# Patient Record
Sex: Female | Born: 1935 | Race: White | Hispanic: No | Smoking: Never smoker
Health system: Southern US, Community
[De-identification: ages and names within clinical notes are randomized; demographics above are authoritative.]

## PROBLEM LIST (undated history)

## (undated) DIAGNOSIS — G20 Parkinson's disease: Secondary | ICD-10-CM

## (undated) DIAGNOSIS — G20A1 Parkinson's disease without dyskinesia, without mention of fluctuations: Secondary | ICD-10-CM

## (undated) DIAGNOSIS — F0390 Unspecified dementia without behavioral disturbance: Secondary | ICD-10-CM

## (undated) DIAGNOSIS — E785 Hyperlipidemia, unspecified: Secondary | ICD-10-CM

## (undated) DIAGNOSIS — I10 Essential (primary) hypertension: Secondary | ICD-10-CM

## (undated) HISTORY — PX: OVARIAN CYST SURGERY: SHX726

## (undated) HISTORY — PX: APPENDECTOMY: SHX54

## (undated) HISTORY — PX: TONSILLECTOMY AND ADENOIDECTOMY: SUR1326

## (undated) HISTORY — PX: JOINT REPLACEMENT: SHX530

## (undated) HISTORY — DX: Hyperlipidemia, unspecified: E78.5

## (undated) HISTORY — DX: Essential (primary) hypertension: I10

---

## 1998-05-02 ENCOUNTER — Other Ambulatory Visit: Admission: RE | Admit: 1998-05-02 | Discharge: 1998-05-02 | Payer: Self-pay | Admitting: Family Medicine

## 1999-05-13 ENCOUNTER — Encounter: Payer: Self-pay | Admitting: Family Medicine

## 1999-05-13 ENCOUNTER — Encounter: Admission: RE | Admit: 1999-05-13 | Discharge: 1999-05-13 | Payer: Self-pay | Admitting: Family Medicine

## 1999-07-31 ENCOUNTER — Emergency Department (HOSPITAL_COMMUNITY): Admission: EM | Admit: 1999-07-31 | Discharge: 1999-07-31 | Payer: Self-pay | Admitting: Internal Medicine

## 2000-09-04 ENCOUNTER — Other Ambulatory Visit: Admission: RE | Admit: 2000-09-04 | Discharge: 2000-09-04 | Payer: Self-pay | Admitting: Family Medicine

## 2001-02-13 ENCOUNTER — Emergency Department (HOSPITAL_COMMUNITY): Admission: EM | Admit: 2001-02-13 | Discharge: 2001-02-13 | Payer: Self-pay | Admitting: Emergency Medicine

## 2001-04-20 ENCOUNTER — Emergency Department (HOSPITAL_COMMUNITY): Admission: EM | Admit: 2001-04-20 | Discharge: 2001-04-20 | Payer: Self-pay | Admitting: Emergency Medicine

## 2001-04-20 ENCOUNTER — Encounter: Payer: Self-pay | Admitting: Emergency Medicine

## 2001-04-28 ENCOUNTER — Encounter: Payer: Self-pay | Admitting: Orthopedic Surgery

## 2001-05-04 ENCOUNTER — Encounter: Payer: Self-pay | Admitting: Orthopedic Surgery

## 2001-05-04 ENCOUNTER — Inpatient Hospital Stay (HOSPITAL_COMMUNITY): Admission: RE | Admit: 2001-05-04 | Discharge: 2001-05-11 | Payer: Self-pay | Admitting: Orthopedic Surgery

## 2003-12-25 ENCOUNTER — Encounter: Admission: RE | Admit: 2003-12-25 | Discharge: 2003-12-25 | Payer: Self-pay | Admitting: Family Medicine

## 2003-12-25 ENCOUNTER — Other Ambulatory Visit: Admission: RE | Admit: 2003-12-25 | Discharge: 2003-12-25 | Payer: Self-pay | Admitting: Family Medicine

## 2006-04-01 ENCOUNTER — Inpatient Hospital Stay (HOSPITAL_COMMUNITY): Admission: RE | Admit: 2006-04-01 | Discharge: 2006-04-06 | Payer: Self-pay | Admitting: Orthopedic Surgery

## 2006-04-02 ENCOUNTER — Ambulatory Visit: Payer: Self-pay | Admitting: Physical Medicine & Rehabilitation

## 2006-07-29 ENCOUNTER — Emergency Department (HOSPITAL_COMMUNITY): Admission: EM | Admit: 2006-07-29 | Discharge: 2006-07-29 | Payer: Self-pay | Admitting: Emergency Medicine

## 2007-01-02 ENCOUNTER — Emergency Department (HOSPITAL_COMMUNITY): Admission: EM | Admit: 2007-01-02 | Discharge: 2007-01-02 | Payer: Self-pay | Admitting: *Deleted

## 2008-11-25 ENCOUNTER — Inpatient Hospital Stay (HOSPITAL_COMMUNITY): Admission: EM | Admit: 2008-11-25 | Discharge: 2008-12-05 | Payer: Self-pay | Admitting: Emergency Medicine

## 2008-11-25 ENCOUNTER — Encounter (INDEPENDENT_AMBULATORY_CARE_PROVIDER_SITE_OTHER): Payer: Self-pay | Admitting: Internal Medicine

## 2008-11-30 ENCOUNTER — Other Ambulatory Visit: Payer: Self-pay | Admitting: Emergency Medicine

## 2008-12-01 ENCOUNTER — Other Ambulatory Visit: Payer: Self-pay | Admitting: Emergency Medicine

## 2008-12-01 ENCOUNTER — Encounter (INDEPENDENT_AMBULATORY_CARE_PROVIDER_SITE_OTHER): Payer: Self-pay | Admitting: Internal Medicine

## 2008-12-03 ENCOUNTER — Other Ambulatory Visit: Payer: Self-pay | Admitting: Internal Medicine

## 2009-03-16 ENCOUNTER — Observation Stay (HOSPITAL_COMMUNITY): Admission: EM | Admit: 2009-03-16 | Discharge: 2009-03-20 | Payer: Self-pay | Admitting: Emergency Medicine

## 2009-08-20 ENCOUNTER — Encounter: Admission: RE | Admit: 2009-08-20 | Discharge: 2009-08-20 | Payer: Self-pay | Admitting: Family Medicine

## 2010-02-10 ENCOUNTER — Encounter: Payer: Self-pay | Admitting: Orthopedic Surgery

## 2010-04-10 LAB — COMPREHENSIVE METABOLIC PANEL
CO2: 25 mEq/L (ref 19–32)
Chloride: 105 mEq/L (ref 96–112)
GFR calc non Af Amer: >60 mL/min (ref >60)
Glucose, Bld: 100 mg/dL — ABNORMAL HIGH (ref 70–99)
Sodium: 138 mEq/L (ref 135–145)
Total Protein: 7.1 g/dL (ref 6.0–8.3)

## 2010-04-10 LAB — CARDIAC PANEL(CRET KIN+CKTOT+MB+TROPI)
CK, MB: 2.3 ng/mL (ref 0.3–4.0)
Relative Index: 1.8 (ref 0.0–2.5)
Total CK: 115 U/L (ref 7–177)
Total CK: 128 U/L (ref 7–177)
Troponin I: 0.02 ng/mL (ref 0.00–0.06)

## 2010-04-10 LAB — DIFFERENTIAL
Basophils Absolute: 0 10*3/uL (ref 0.0–0.1)
Eosinophils Absolute: 0.1 10*3/uL (ref 0.0–0.7)
Monocytes Absolute: 0.6 10*3/uL (ref 0.1–1.0)
Monocytes Relative: 12 % (ref 3–12)
Neutrophils Relative %: 62 % (ref 43–77)

## 2010-04-10 LAB — URINALYSIS, ROUTINE W REFLEX MICROSCOPIC
Bilirubin Urine: NEGATIVE
Glucose, UA: NEGATIVE mg/dL
Ketones, ur: NEGATIVE mg/dL
Nitrite: NEGATIVE
Urobilinogen, UA: 0.2 mg/dL (ref 0.0–1.0)

## 2010-04-10 LAB — CBC
MCHC: 33.5 g/dL (ref 30.0–36.0)
MCV: 89.1 fL (ref 78.0–100.0)
Platelets: 249 10*3/uL (ref 150–400)
RBC: 4.43 MIL/uL (ref 3.87–5.11)
RDW: 13.7 % (ref 11.5–15.5)
RDW: 13.9 % (ref 11.5–15.5)
WBC: 5.1 10*3/uL (ref 4.0–10.5)

## 2010-04-10 LAB — BASIC METABOLIC PANEL
CO2: 29 mEq/L (ref 19–32)
Calcium: 9.5 mg/dL (ref 8.4–10.5)
Creatinine, Ser: 0.7 mg/dL (ref 0.4–1.2)
Glucose, Bld: 109 mg/dL — ABNORMAL HIGH (ref 70–99)

## 2010-04-10 LAB — BRAIN NATRIURETIC PEPTIDE: Pro B Natriuretic peptide (BNP): <30 pg/mL (ref 0.0–100.0)

## 2010-04-10 LAB — URINE CULTURE: Colony Count: >=100000

## 2010-04-10 LAB — URINE MICROSCOPIC-ADD ON

## 2010-04-10 LAB — POCT CARDIAC MARKERS: Troponin i, poc: <0.05 ng/mL (ref 0.00–0.09)

## 2010-04-24 LAB — CBC
Hemoglobin: 13.1 g/dL (ref 12.0–15.0)
MCHC: 34 g/dL (ref 30.0–36.0)
MCV: 88.5 fL (ref 78.0–100.0)
MCV: 89.1 fL (ref 78.0–100.0)
Platelets: 249 10*3/uL (ref 150–400)
Platelets: 289 10*3/uL (ref 150–400)
RBC: 4.32 MIL/uL (ref 3.87–5.11)
RBC: 4.44 MIL/uL (ref 3.87–5.11)
RDW: 13.3 % (ref 11.5–15.5)
WBC: 4.9 10*3/uL (ref 4.0–10.5)
WBC: 5.8 10*3/uL (ref 4.0–10.5)

## 2010-04-24 LAB — COMPREHENSIVE METABOLIC PANEL
ALT: 16 U/L (ref 0–35)
ALT: 20 U/L (ref 0–35)
AST: 22 U/L (ref 0–37)
AST: 25 U/L (ref 0–37)
Albumin: 3.6 g/dL (ref 3.5–5.2)
Alkaline Phosphatase: 47 U/L (ref 39–117)
CO2: 27 mEq/L (ref 19–32)
CO2: 29 mEq/L (ref 19–32)
Calcium: 8.7 mg/dL (ref 8.4–10.5)
Chloride: 109 mEq/L (ref 96–112)
Creatinine, Ser: 0.68 mg/dL (ref 0.4–1.2)
GFR calc Af Amer: >60 mL/min (ref >60)
GFR calc Af Amer: >60 mL/min (ref >60)
GFR calc non Af Amer: >60 mL/min (ref >60)
Glucose, Bld: 112 mg/dL — ABNORMAL HIGH (ref 70–99)
Potassium: 4.2 mEq/L (ref 3.5–5.1)
Sodium: 139 mEq/L (ref 135–145)
Sodium: 143 mEq/L (ref 135–145)
Total Bilirubin: 0.7 mg/dL (ref 0.3–1.2)
Total Protein: 7 g/dL (ref 6.0–8.3)

## 2010-04-24 LAB — DIFFERENTIAL
Basophils Absolute: 0 10*3/uL (ref 0.0–0.1)
Basophils Relative: 0 % (ref 0–1)
Eosinophils Absolute: 0.1 10*3/uL (ref 0.0–0.7)
Eosinophils Absolute: 0.1 10*3/uL (ref 0.0–0.7)
Eosinophils Relative: 1 % (ref 0–5)
Eosinophils Relative: 2 % (ref 0–5)
Lymphocytes Relative: 21 % (ref 12–46)
Lymphs Abs: 1.1 10*3/uL (ref 0.7–4.0)
Lymphs Abs: 1.2 10*3/uL (ref 0.7–4.0)
Monocytes Absolute: 0.5 10*3/uL (ref 0.1–1.0)
Monocytes Relative: 10 % (ref 3–12)
Neutrophils Relative %: 63 % (ref 43–77)

## 2010-04-24 LAB — URINE CULTURE
Colony Count: >=100000
Colony Count: NO GROWTH
Culture: NO GROWTH

## 2010-04-24 LAB — URINE MICROSCOPIC-ADD ON

## 2010-04-24 LAB — BASIC METABOLIC PANEL
BUN: 17 mg/dL (ref 6–23)
Calcium: 9 mg/dL (ref 8.4–10.5)
Chloride: 108 mEq/L (ref 96–112)
Creatinine, Ser: 0.73 mg/dL (ref 0.4–1.2)

## 2010-04-24 LAB — URINALYSIS, ROUTINE W REFLEX MICROSCOPIC
Glucose, UA: NEGATIVE mg/dL
Hgb urine dipstick: NEGATIVE
Ketones, ur: NEGATIVE mg/dL
Ketones, ur: NEGATIVE mg/dL
Nitrite: NEGATIVE
Protein, ur: NEGATIVE mg/dL
Protein, ur: NEGATIVE mg/dL
Urobilinogen, UA: 0.2 mg/dL (ref 0.0–1.0)
pH: 5.5 (ref 5.0–8.0)

## 2010-04-24 LAB — CK TOTAL AND CKMB (NOT AT ARMC): Relative Index: INVALID (ref 0.0–2.5)

## 2010-04-24 LAB — TSH: TSH: 0.908 u[IU]/mL (ref 0.350–4.500)

## 2010-04-24 LAB — HEMOGLOBIN A1C: Hgb A1c MFr Bld: 6.1 % (ref 4.6–6.1)

## 2010-04-24 LAB — LIPID PANEL
Triglycerides: 112 mg/dL (ref <150)
VLDL: 22 mg/dL (ref 0–40)

## 2010-04-24 LAB — HOMOCYSTEINE: Homocysteine: 11 umol/L (ref 4.0–15.4)

## 2010-04-24 LAB — GLUCOSE, CAPILLARY: Glucose-Capillary: 109 mg/dL — ABNORMAL HIGH (ref 70–99)

## 2010-04-24 LAB — MAGNESIUM: Magnesium: 2 mg/dL (ref 1.5–2.5)

## 2010-04-24 LAB — TROPONIN I: Troponin I: 0.01 ng/mL (ref 0.00–0.06)

## 2010-05-27 ENCOUNTER — Encounter: Payer: Self-pay | Admitting: Cardiology

## 2010-06-07 NOTE — H&P (Signed)
[facility]  Patient:    Bonnie Mora, Bonnie Mora Visit Number: [id number] MRN: [id number]          Service Type: EMS Location: [facility] Attending Physician:  Devoria Albe Dictated by:   Dorie Rank, P.A. Admit Date:  04/20/2001 Discharge Date: 04/20/2001   CC:         Talmadge Coventry, M.D.   History and Physical  DATE OF BIRTH:  Dec 26, 2026  CHIEF COMPLAINT:  Right knee pain.  HISTORY OF PRESENT ILLNESS:  [name] is a pleasant 77 year old female who has a long history of right knee pain for quite some time now.  She has failed conservative treatment.  She had radiographs in the office on March 29, 2001, which revealed a complete collapse of the medial joint space on the right. The pain was to a point where it was interfering with her activities of daily living.  She tried Hyalgan infections, as well as nonsteroidals and activity modifications.  On physical exam in the office it was noted that she walked with an antalgic gait and had decreased range of motion to that right knee, as well as crepitants on range of motion.  It was felt due to diagnostic studies, interference with her ADLs, and physical exam results that she would benefit from undergoing a right total knee arthroplasty.  The risks and benefits, as well as the procedure were discussed with the patient and she elected to proceed.  She obtained a medical clearance from her family medical physician, Talmadge Coventry, M.D.  ALLERGIES:  CODEINE causes nausea.  MEDICATIONS: 1. Welchol three p.o. q.a.m. and three p.o. q.p.m. 2. Hydrochlorothiazide 25 mg half of a tablet p.o. q.d. 3. Toprol XL 50 mg one p.o. b.i.d. 4. Potassium chloride 10 mEq.  She reports that she takes one p.o. q.d. and    two p.o. q.d. when she has bad muscle cramps. 5. Bextra 20 mg one p.o. q.d. p.r.n.  She will discontinue this medication    three days prior to her surgery.  PAST MEDICAL HISTORY:  Significant for 1.  Osteoarthritis. 2. Hypertension. 3. Hyperlipidemia. 4. Diverticulosis. 5. Hiatal hernia.  PAST SURGICAL HISTORY: 1. Tonsillectomy in 1936. 2. D&C in 1950 and 1952. 3. She had a cyst removed from her ovary in 1952 that was benign. 4. She had a polyp removed from her urethra in 1965.  SOCIAL HISTORY:  The patient is married.  She is retired.  She does not smoke or drink.  She has two children.  Her husband will be available as her caregiver after surgery.  She lives in a one-level home.  She plans for home health PT.  She states that she would possibly be interested in rehabilitation.  FAMILY HISTORY:  Father deceased at age 72 with a history of myocardial infarction.  She has a grandmother with a history of breast cancer.  Her mother is deceased at age 45 with a history of ALS.  REVIEW OF SYSTEMS:  General:  No fevers, night sweats, or bleeding tendencies. Pulmonary:  No shortness of breath, productive cough, or hemoptysis. Cardiovascular:  No chest pain, angina, or orthopnea.  Endocrine:  No history of discharge instructions nor hypothyroidism or hyperthyroidism.  GU: Significant for polyuria.  No dysuria or hematuria.  GI:  No nausea, vomiting, diarrhea, or melena.  She had a GI virus about three weeks ago which has since then resolved.  It is also of note that she had recent vaginal candidiasis. She recently took Diflucan one week  ago.  She is asymptomatic at todays visit.  Neurologic:  No seizures, headaches, or paralysis.  PHYSICAL EXAMINATION:  An alert, oriented, well-developed, well-nourished, 77 year old female.  VITAL SIGNS:  Pulse 60, respirations 16, blood pressure 150/90.  HEENT:  The head is normocephalic and atraumatic.  It is of note that she does wear a wig and has alopecia.  NECK:  Supple.  Negative for carotid bruits bilaterally.  Negative for cervical lymphadenopathy.  CHEST:  Clear to auscultation bilaterally.  No wheezes, rales, or rhonchi.  BREASTS:   Not pertinent to present illness.  HEART:  S1 and S2 negative for murmurs, rubs, or gallops.  Regular rate and rhythm.  ABDOMEN:  Soft and nontender.  Positive bowel sounds.  GENITOURINARY:  Not pertinent to present illness.  EXTREMITIES:  She walks with an antalgic gait.  She has crepitants with range of motion to her right knee.  Skin intact.  No rashes or lesions appreciated on exam.  Dorsalis pedis pulses 1+ and symmetrical.  Posterior tibialis pulses 1+ and symmetrical.  PREOPERATIVE LABORATORY DATA AND X-RAYS:  Pending.  IMPRESSION: 1. Osteoarthritis of the right hip. 2. Hypertension. 3. Hyperlipidemia. 4. History of diverticulosis. 5. History of hiatal hernia.  PLAN:  The patient is scheduled for a right total knee arthroplasty with Windy Fast A. Darrelyn Hillock, M.D. Dictated by:   Dorie Rank, P.A. Attending Physician:  Devoria Albe DD:  04/27/01 TD:  04/27/01 Job: [id number]

## 2010-06-07 NOTE — Discharge Summary (Signed)
[facility]  Patient:    [name], [name] Visit Number: [id number] MRN: [id number]          Service Type: SUR Location: 4W 0480 01 Attending Physician:  Skip Mayer Dictated by:   Marcie Bal Troncale, P.A.C. Admit Date:  05/04/2001 Discharge Date: 05/11/2001   CC:         Talmadge Coventry, M.D.  Faith Rogue, M.D.   Discharge Summary  DISCHARGE DIAGNOSES: 1. End-stage osteoarthritis, right knee. 2. Acute blood loss anemia, status post transfusion. 3. Hypertension. 4. Hyperlipidemia. 5. Diverticulosis. 6. Hiatal hernia.  PROCEDURE:  Right total knee arthroplasty by Dr. Darrelyn Hillock with assistance of Haze Rushing, P.A.-C on May 04, 2001.  Please see operative summary for further details.  CONSULTATIONS: 1. Dr. Riley Kill, physical medicine & rehabilitation. 2. Dr. Talmadge Coventry.  LABORATORY DATA AND X-RAY FINDINGS:  Preoperative H&H was 13.6 and 40.1 respectively.  She reached a low of 8.9 and 26.3 on May 05, 2001. Posttransfusion, this improved to 10.1 and 28.9 remaining stable afterwards. PT/INR and PTT preop were 13.9, 1.1 and 27 respectively.  INR was up to 1.8 by April 17, and remained therapeutic.  Routine chemistries preop were all within normal limits with sodium and potassium 140 and 4.4 respectively, BUN and creatinine 21 and 1.0 respectively, sodium dipped to 130 on May 05, 2001. Urinalysis preoperatively showed small leukocyte esterase, nitrite negative, rare epithelial cells and 0-2 wbcs.  She had 20,000 colonies, but without multiple species without uropathogens isolated.  EKG preop showed normal sinus rhythm and normal EKG.  A repeat on April 16, showed unusual P axis with possible ectopic atrial rhythm, but otherwise no abnormal ST or T wave changes.  Chest x-ray on April 9, showed mild scoliosis and no active disease in the chest.  Postop x-ray of the knees showed normal postoperative appearance of the  right knee.  CHIEF COMPLAINT:  Right knee pain.  HISTORY OF PRESENT ILLNESS:  Bonnie Mora is a 77 year old female who presents with a long history of worsening right knee pain.  She had undergone conservative measurements without improvement.  She had tried cortisone injections as well as anti-inflammatory medications without improvement.  It was to the point that pain was interfering with her activities of daily living.  She now elected to undergo surgical intervention.  She had received preoperative clearance by Dr. Talmadge Coventry.  HOSPITAL COURSE:  Following the surgical procedure, the patient was taken to the PACU in stable condition.  She was transferred to the orthopedic floor in good condition.  She did well during her postoperative stay.  She received routine postoperative antibiotics and pain medications.  She was on Coumadin for DVT prophylaxis and had therapeutic INR prior to discharge.   She did have one episode of hypotension postop.  Her PCA Dilaudid was stopped and her hemoglobin was rechecked.  This improved with fluids.  She, at that time, refused blood transfusion.  EKG was obtained.  On postop day #2, her blood pressure was still in the 90s, although she was asymptomatic.  The patient did consent to blood transfusion at that time.  She did have a slight temperature after transfusion.  The patients primary physician assisted [place] with managing the patient for her hypotension and anemia.  After transfusion, the patients blood counts remained stable.  The wound was examined on postop day #2 and found to be clean, dry and intact without signs or symptoms of infection.  A PM&R consult was obtained and they  felt the patient would be an appropriate candidate for SACU placement when the patient was stable and a bed available.  After the episodes of anemia and postoperative hypotension, the patient did improve and remained medically stable from that point on.  By postop  day #6, the patient had actually progressed extremely well with therapy and was now to a level that she was independent with activity and therapy and would be appropriate for discharge home.  On postop day #7, she was stable from an orthopedic and medical standpoint and ready for discharge.  DISPOSITION:  The patient is being discharged home with [place].  DIET:  Low sodium.  ACTIVITY:  Weightbearing as tolerated.  WOUND CARE:  Once daily dressing change to the knee.  SPECIAL INSTRUCTIONS:  Okay to shower once daily.  She is to wear TED hose.  FOLLOWUP:  She is to follow up with Dr. Darrelyn Hillock in one week.  Call [phone], for an appointment.  DISCHARGE MEDICATIONS: 1. Coumadin per pharmacy dosing. 2. Darvocet-N 100 one or two every four to six hours as needed for pain. 3. Robaxin 500 mg p.o. q.8h. p.r.n. spasm. 4. Trinsicon one p.o. t.i.d. 5. Welchol 3 p.o. q.a.m. and 3 p.o. q.p.m. 6. Hydrochlorothiazide 12.5 mg p.o. q.d. 7. Toprol XL 50 mg p.o. b.i.d. 8. Potassium chloride 10 mEq p.o. q.d. 9. Bextra, hold until she finishes Coumadin.  CONDITION ON DISCHARGE:  Good and improved. Dictated by:   Marcie Bal Troncale, P.A.C. Attending Physician:  Skip Mayer DD:  05/20/01 TD:  05/23/01 Job: [phone]

## 2010-06-07 NOTE — Op Note (Signed)
[facility]  Patient:    Bonnie Mora, Bonnie Mora Visit Number: [id number] MRN: [id number]          Service Type: SUR Location: 4W 0469 01 Attending Physician:  Skip Mayer Dictated by:   Georges Lynch Darrelyn Hillock, M.D. Proc. Date: 05/04/01 Admit Date:  05/04/2001   CC:         Talmadge Coventry, M.D.   Operative Report  PREOPERATIVE DIAGNOSIS:  Severe degenerative arthritis with changes of varus of the right knee.  POSTOPERATIVE DIAGNOSIS:  Severe degenerative arthritis with changes of varus of the right knee.  OPERATION:  Right total knee arthroplasty utilizing [employer] system.  I utilized the posterior cruciate sacrificing system.  The sizes used was a 26 patella, a size 7 tray, a size 7 right femur with a 12 mm thickness tibial insert.  SURGEON:  Georges Lynch. Darrelyn Hillock, M.D.  ASSISTANT:  Irena Cords, P.A.-C.  DESCRIPTION OF PROCEDURE:  Under general anesthesia, routine orthopedic prep and draping of the right knee was carried out.  She had 1 g of IV Ancef preoperative.  At this time, the leg was exsanguinated with an Esmarch and the tourniquet was elevated to 350 mmHg.  An anterior approach of the knee was carried out.  Bleeders identified and cauterized.  I then carried out a median parapatellar incision, reflected the patella laterally, flexed the knee, and did lateral medial meniscectomy, and then excised the anterior and posterior cruciate ligaments.  I then made my initial drill hole in the intercondylar notch.  The guide rod was inserted and a 10 mm thickness was removed from the distal cut.  A #2 jig was inserted.  I then carried out anterior and posterior chamfering cuts for a size 7 right femur.  Following this, we prepared the tibia in the usual fashion.  I removed 4 mm thickness off the medial side of the tibia.  At this time, after the tibia was repaired, we did prepare the tibia by the way with the intermedullary rod as out  guide.  The tibia was prepared for a size 7 tibial tray.  Following this, I carried out my patellar groove cut to the distal femur for a size 7 right femur.  Following this, I then went through the trial, first with a 10 mm thickness and then a 12 mm thickness insert.  We utilized a size 7 tibial tray and a size 7 right femur.  I then removed 10 mm thickness off the articular surface of the patella for a size 26 patella.  Three drill holes were made in the patella.  We then irrigated the knee, flexed the knee, and cut my keel cut out of the proximal tibia.  Thoroughly waterpicked out the knee, dried the knee out, and made sure there were no other loose bodies of the knee.  I then cemented all three components simultaneously.  The loose pieces of cement were removed.  I did use Gelfoam into the hole that was made in the distal femur and also down into the tibia to prevent migration of the cement.  After this, we then went through trials with a 10 mm and a 12 mm thickness with the tibial insert, and felt that 12 mm thickness was the most stable.  I then thoroughly waterpicked the knee out, dried the knee out, and removed all loose pieces of bone and cement.  I then inserted my permanent 12 mm thickness tibial insert.  We then went through a range of motion.  We had good motion and good stability.  I then inserted a Hemovac drain and closed the knee in layers in the usual fashion.  A sterile Neosporin dressing was applied.  Following this, the patient was placed in a knee immobilizer, and left the operating room in satisfactory condition. Dictated by:   Georges Lynch Darrelyn Hillock, M.D. Attending Physician:  Skip Mayer DD:  05/04/01 TD:  05/04/01 Job: [id number]

## 2010-06-07 NOTE — H&P (Signed)
NAME:  Bonnie Mora, Bonnie Mora                 ACCOUNT NO.:  [id number]   MEDICAL RECORD NO.:  [id number]         PATIENT TYPE:  LINP   LOCATION:                               FACILITY:  [facility]   PHYSICIAN:  Ronald A. Gioffre, M.D.DATE OF BIRTH:  1926-03-24   DATE OF ADMISSION:  04/01/2006  DATE OF DISCHARGE:                              HISTORY & PHYSICAL   CHIEF COMPLAINT:  Left knee pain.   HISTORY OF PRESENT ILLNESS:  Bonnie Mora is here today for evaluation of  left knee pain.  She has been evaluated by Dr. Darrelyn Hillock in the past and  found to have end-stage osteoarthritis of the knee.  The patient  complains of continued pain with ambulation with weightbearing and range  of motion. Failed conservative treatments. Would like to proceed with a  total knee arthroplasty. Has had excellent results with her right total  knee arthroplasty which she got in 2004.  X-ray shows that she is bone-  on-bone medial compartment of her left total knee arthroplasty with  patellofemoral changes.   ALLERGIES:  NO KNOWN DRUG ALLERGIES.   CURRENT MEDICATIONS:  1. Potassium 10 mEq a day.  2. Hydrochlorothiazide 25 mg a day.  3. Metoprolol 50 mg b.i.d.  4. Zetia 10 mg a day.  5. Topamax 75 mg at bedtime.   PAST MEDICAL HISTORY:  1. The patient has benign essential tremors  2. Hypertension.  3. Stress test two months previous was normal.   PAST SURGICAL HISTORY:  1. Tonsils in 1937.  2. Oophorectomy in 1952.  3. Right total knee arthroplasty in 2004.  4. The patient denies any complications other than having a difficult      time waking up after anesthesia.   SOCIAL HISTORY:  The patient is married.  She is currently retired, and  lives with her husband.  She lives in a [place] house.   FAMILY MEDICAL HISTORY:  Father is deceased from complications of an MI.  Mother is deceased from complications of breast cancer.  Mother is  deceased from Nelson Gehrig's disease.   PRIMARY CARE PHYSICIAN:  Talmadge Coventry, M.D.  The patient has been  medically cleared.   REVIEW OF SYSTEMS:  Negative for any endocrine, hematologic, GU, GI, or  cardiovascular other than her hypertension.  No pulmonary.   PHYSICAL EXAMINATION:  VITAL SIGNS: Height is 5 feet 2 inches, weight is  140 pounds, blood pressure is 160/82, pulse of 72 and regular,  respirations 12, the patient is afebrile.  GENERAL:  This is a healthy-appearing, well-developed, 77 year old  female conscious, alert, and appropriate.  She does speak with a tremor  type voice, and when she does move her arms round she does have a slight  tremor.  HEENT: Head was normocephalic.  Pupils equal, round, and reactive.  Extraocular movements intact.  Orobuccal mucosa is pink and moist.  NECK:  Supple.  No palpable lymphadenopathy.  She has some fullness in  her thyroid region, but no palpable masses or tenderness.  She has no  lymphadenopathy.  She has good range of motion of her  neck.  CHEST:  Lung sounds are clear and equal bilaterally.  No wheezes, rales,  or rhonchi.  HEART:  Regular rate and rhythm.  No murmurs, rubs, or gallops.  ABDOMEN:  Soft, nontender.  Bowel sounds present.  No CVA region  tenderness.  UPPER EXTREMITIES:  Symmetric in size and shape.  She had good range of  motion of her shoulders, elbows, and wrists.  Motor strength is 5/5.  LOWER EXTREMITIES:  Right and left hip had full internal and external  rotation and flexion/extension without any discomfort.  Right knee had a  well-healed midline surgical incision.  She was able to fully extend it  and flex it back to 100 degrees with no instability.  The calf was soft  and nontender.  Left knee had some slight swelling.  No erythema or  ecchymosis.  No effusion.  She is able to fully extend.  She could flex  it back to 120 degrees. No instability.  She was tender along the medial  joint line.  The calf was soft and nontender.  The ankles were  symmetric, with good dorsi- and  plantar flexion.  PERIPHERAL VASCULAR:  Carotid pulses were 2+, no bruits.  Radial pulses  were 2+. Dorsalis pedis on the right was 1+. Unable to palpate a  dorsalis pedis or posterior tibial on the left, but it was present and  loud on Doppler in the posterior tibial region.  She had no  pigmentations or lower extremity edema.  NEUROLOGIC:  The patient was conscious, alert, and appropriate.  She did  speak with a slight tremor-type voice.  She also has some essential  tremors.  She was grossly intact to light touch sensation.  BREASTS/RECTAL/GU:  Deferred.   IMPRESSION:  1. End-stage osteoarthritis, bone on bone, medial compartment left      knee.  2. Status post right total knee arthroplasty in 2004.  3. Benign tremor  4. Hypertension.   PLAN:  The patient will undergo all routine labs and tests prior to  having a left total knee arthroplasty by Dr. Darrelyn Hillock at [facility] on April 01, 2006.  The patient has been evaluated and cleared  by Dr. Smith Mince.  The patient had a recent stress test 2 months ago.  She was reported to be normal.  The patient indicates that on last  surgical procedure she had a low difficult time waking up.  Dr. Darrelyn Hillock  recommend that she have a spinal, but will have her discuss this with  anesthesia, as the patient would really like to have general.  The  patient had no other questions today.      Jamelle Rushing, P.A.    ______________________________  Georges Lynch Darrelyn Hillock, M.D.   RWK/MEDQ  D:  03/16/2006  T:  03/16/2006  Job:  [id number]

## 2010-06-07 NOTE — Discharge Summary (Signed)
NAME:  Bonnie Mora, Bonnie Mora                 ACCOUNT NO.:  [id number]   MEDICAL RECORD NO.:  [id number]          PATIENT TYPE:  INP   LOCATION:  1510                         FACILITY:  [facility]   PHYSICIAN:  Georges Lynch. Gioffre, M.D.DATE OF BIRTH:  11/25/1926   DATE OF ADMISSION:  04/01/2006  DATE OF DISCHARGE:  04/06/2006                               DISCHARGE SUMMARY   ADMISSION DIAGNOSES:  1. End-stage osteoarthritis, left knee.  2. Hypertension.  3. Benign essential tremors.   HISTORY OF PRESENT ILLNESS:  Bonnie Mora is a 77 year old female with  chronic left knee pain that has been evaluated in the past and found to  have end-stage osteoarthritis.  The patient failed conservative  treatment.  She continues with pain with ambulation and range of motion  and elected to proceed with a total knee arthroplasty.  X-rays revealed  that she is bone-on-bone, medial compartment, with some patellofemoral  changes.   ALLERGIES:  No known drug allergies.   CURRENT MEDICATIONS:  1. Potassium 10 mEq a day.  2. Hydrochlorothiazide 25 mg a day.  3. Toprol 50 mg twice a day.  4. Zetia 10 mg a day.  5. Topamax 75 mg at night.   SURGICAL PROCEDURE:  On April 01, 2006, she was taken to the OR by Dr.  Darrelyn Hillock, assisted by Jamelle Rushing, P.A.-C.  Under spinal anesthesia  the patient underwent a left total knee arthroplasty with a DePuy  rotating system.  The patient tolerated the procedure well.  Estimated  blood loss was 50 mL and no complications.  The patient was transferred  to the recovery room and then to the orthopedic floor in good condition.  The following implants were placed in the patient:  A size 3 left  femoral component, a size 3 keeled tibial tray, a size 3 12.5  polyethylene bearing, a size 35 three-peg patella.  All components were  implanted with polymethyl methacrylate with vancomycin mixed in.   CONSULTATIONS:  The following routine consults were requested:  Physical  therapy, case  management, pharmacy for DVT prophylaxis.   HOSPITAL COURSE:  On April 01, 2006, the patient was admitted to [facility] under the care of Dr. Ranee Gosselin.  The patient was  taken to the OR, where a left total knee arthroplasty was performed.  There were no complications.  The patient was transferred to the  recovery room and then to the orthopedic floor with IV pain medicines,  antibiotics, and started on Coumadin DVT therapy for prophylaxis and  routine total knee protocol.   The patient then incurred about 4 days of postoperative care on the  orthopedic floor, in which the patient did very well.  She had no  medical events.  Her vital signs remained stable.  She remained  afebrile.  The patient's wound remained benign for any signs of  infection.  The leg remained neuro, motor and vascularly intact.  The  patient worked well with therapy from a day-to-day basis but it was felt  that on postop day #5 that she was orthopedically stable and ready  to  home.  The patient felt that she was unable to care for herself at home  with her elderly husband.  She was evaluated and recommendations were  made for skilled nursing facility.  This was evaluated and she was  accepted at a skilled nursing facility so arrangements were made for  discharge.  She is to be discharged to that facility in good condition  with routine follow-up.  The patient will continue with physical therapy  at that facility.   LABORATORY DATA:  A CBC on admission was WBC 5.6, hemoglobin 14.4,  hematocrit 42.1, platelets 298.  Her H&H on March 15 was 10.0 and 29.7,  routine postoperative blood loss anemia.  She was asymptomatic,  tolerating it well, so she did not receive a transfusion.  We would just  let it self-correct with p.o. supplements.  The patient's INR on March  16 was 2.4.  She was from 2.5 the day before, but she had been slowly  drifting down without any daily dosages and on discharge she was  1.9.  Routine chemistries on admission were normal with the exception the BUN  was 26.  Urinalysis on admission was normal except for the appearance  was cloudy.  EKG on admission was normal sinus rhythm.   MEDICATIONS ON DISCHARGE TO SKILLED NURSING FACILITY:  1. Colace 100 mg p.o. b.i.d.  2. Ferrous sulfate 325 mg p.o. t.i.d.  3. Topamax 75 mg q.h.s.  4. Zetia 10 mg p.o. q.h.s.  5. Hydrochlorothiazide 25 mg a day.  6. Toprol 50 mg b.i.d.  7. Potassium chloride 10 mEq a day.  8. Reglan 10 mg p.o. q.8h. p.r.n.  9. Robaxin 500 mg p.o. q.6h. p.r.n.  10.Coumadin currently on hold for supertherapeutic dosing but will      need to restart on 2 mg or 2.5 mg today.  The patient is to      maintain an INR of 2-2.5 for a total of 30 days.   DISCHARGE INSTRUCTIONS:  Diet:  No restrictions.   Wound care:  The patient is to have dressing change daily and on a  p.r.n. basis.   Activity:  The patient may be weightbearing as tolerated with the use of  a walker and physical therapy.   Staples will be removed on postop day #4, today is day #5, then Steri-  Strips applied.   Medications:  The patient is to continue with medications as noted above  with the addition of:  1. Mepergan Fortis one or two tablets every 4-6 hour p.r.n. pain.  2. Robaxin 500 mg one tablet every 6 hours p.r.n. muscle spasms.  3. Coumadin to maintain a Coumadin therapeutic dose.   The patient is to have a follow-up appointment with Dr. Darrelyn Hillock in his  office in 2 weeks from discharge to skilled nursing facility.  Please  call [phone] for that appointment.   The patient's condition upon discharge is listed as good.      Jamelle Rushing, P.A.    ______________________________  Georges Lynch Darrelyn Hillock, M.D.    RWK/MEDQ  D:  04/06/2006  T:  04/06/2006  Job:  [id number]

## 2010-06-07 NOTE — Op Note (Signed)
NAME:  Bonnie Mora, Bonnie Mora                 ACCOUNT NO.:  [id number]   MEDICAL RECORD NO.:  [id number]          PATIENT TYPE:  INP   LOCATION:  1510                         FACILITY:  [facility]   PHYSICIAN:  Georges Lynch. Gioffre, M.D.DATE OF BIRTH:  07-Dec-1926   DATE OF PROCEDURE:  04/01/2006  DATE OF DISCHARGE:                               OPERATIVE REPORT   PREOPERATIVE DIAGNOSIS:  Degenerative arthritis of the left knee.   POSTOPERATIVE DIAGNOSIS:  Degenerative arthritis of the left knee.   OPERATION:  Left total knee arthroplasty.   NOTE:  Preop, the patient had 1 gram of IV Ancef.  She was taken back to  surgery. Under spinal anesthesia, routine orthopedic prepping and  draping of the left lower extremity was carried out.  The leg was  exsanguinated with an Esmarch and the tourniquet was elevated at 350  mmHg.  At this time, an incision was made over the anterior aspect of  the left knee.  Bleeders were identified and cauterized.  I then made a  median parapatellar approach to the knee reflecting the patella  laterally, flexed the knee, and did medial and lateral meniscectomies,  and excised the anterior and posterior cruciate ligaments.  At this  particular time, I made an initial drill hole in the intercondylar  notch, the #1 jig was inserted, and 10 mm thickness was taken off the  distal femur.  Following that, a #2 jig for a size 3 was applied, which  made our appropriate anterior, posterior, and chamfer cuts of the distal  femur.  Following that, we made sure we had all osteophytes removed from  the posterior aspect of the condyle.  We then prepared our tibia in the  usual fashion.  4 mm thickness was taken off the articular surface of  the tibia utilizing the medial side as our guide.  Great care was taken  not to injure the underlying soft tissue structures.  We did utilize the  intramedullary guide for the procedure.  After that, we then made our  appropriate keel cut in the tibial  plateau.  After the tibia was  prepared, we went back and made our notch cut out of the distal femur.  Following this, we then inserted our trials and went through a trial  range of motion.  We selected a size 3 femur, a size 3 tibia, and an  insert.  We tried a 10 mm thickness and then finally decided to utilize  a 12 mm thickness insert.  We then prepared our patella in the usual  fashion.  We did a resurfacing patella for a size 35 patella.  Three  drill holes were made in the articular surface of the patella.  At this  particular time, we went through a range of motion and had excellent  stability.  We then removed the trial components, water picked the knee,  and made sure there were no other loose fragments of bone.  We then  cemented all three components in simultaneously.  After the cement was  hardened, we made sure there were no other loose  pieces of cement.  We  removed several small pieces of cement from the knee joint.  We water  picked out the knee, again.  We reduced the knee, inserted a Hemovac  drain, and closed the knee in layers in the usual fashion.  Sterile  Neosporin dressings were applied.  The patient left the operating room  in satisfactory condition.  Postop, she will be on Coumadin.   SURGEON:  Georges Lynch. Darrelyn Hillock, M.D.   ASSISTANT:  Jamelle Rushing, P.A.-C.          ______________________________  Georges Lynch. Darrelyn Hillock, M.D.    RAG/MEDQ  D:  04/02/2006  T:  04/03/2006  Job:  [id number]

## 2010-11-05 LAB — URINE MICROSCOPIC-ADD ON

## 2010-11-05 LAB — URINALYSIS, ROUTINE W REFLEX MICROSCOPIC
Bilirubin Urine: NEGATIVE
Ketones, ur: NEGATIVE
Specific Gravity, Urine: 1.026
pH: 5

## 2010-11-05 LAB — COMPREHENSIVE METABOLIC PANEL
ALT: 17
AST: 29
Albumin: 3.6
CO2: 26
Chloride: 104
Creatinine, Ser: 0.69
GFR calc Af Amer: >60
GFR calc non Af Amer: >60
Potassium: 3.6
Sodium: 141
Total Bilirubin: 0.5

## 2010-11-05 LAB — DIFFERENTIAL
Basophils Absolute: 0
Eosinophils Absolute: 0
Eosinophils Relative: 0
Lymphocytes Relative: 1 — ABNORMAL LOW
Lymphs Abs: 0.1 — ABNORMAL LOW
Monocytes Absolute: 0.4

## 2010-11-05 LAB — CBC
MCV: 83.2
Platelets: 266
RBC: 5
WBC: 10.5

## 2014-03-10 ENCOUNTER — Emergency Department (HOSPITAL_COMMUNITY): Payer: Medicare Other

## 2014-03-10 ENCOUNTER — Observation Stay (HOSPITAL_COMMUNITY): Payer: Medicare Other

## 2014-03-10 ENCOUNTER — Observation Stay (HOSPITAL_COMMUNITY)
Admission: EM | Admit: 2014-03-10 | Discharge: 2014-03-11 | Disposition: A | Discharge status: Home / Self Care | Payer: Medicare Other | Attending: Internal Medicine | Admitting: Internal Medicine

## 2014-03-10 ENCOUNTER — Encounter (HOSPITAL_COMMUNITY): Payer: Self-pay | Admitting: *Deleted

## 2014-03-10 DIAGNOSIS — Z86718 Personal history of other venous thrombosis and embolism: Secondary | ICD-10-CM | POA: Diagnosis not present

## 2014-03-10 DIAGNOSIS — G43A0 Cyclical vomiting, not intractable: Secondary | ICD-10-CM

## 2014-03-10 DIAGNOSIS — K5900 Constipation, unspecified: Secondary | ICD-10-CM

## 2014-03-10 DIAGNOSIS — I10 Essential (primary) hypertension: Secondary | ICD-10-CM | POA: Insufficient documentation

## 2014-03-10 DIAGNOSIS — R112 Nausea with vomiting, unspecified: Secondary | ICD-10-CM | POA: Diagnosis present

## 2014-03-10 DIAGNOSIS — E785 Hyperlipidemia, unspecified: Secondary | ICD-10-CM | POA: Diagnosis not present

## 2014-03-10 DIAGNOSIS — Z79899 Other long term (current) drug therapy: Secondary | ICD-10-CM | POA: Diagnosis not present

## 2014-03-10 DIAGNOSIS — E860 Dehydration: Secondary | ICD-10-CM | POA: Insufficient documentation

## 2014-03-10 DIAGNOSIS — F0390 Unspecified dementia without behavioral disturbance: Secondary | ICD-10-CM | POA: Insufficient documentation

## 2014-03-10 DIAGNOSIS — R1110 Vomiting, unspecified: Secondary | ICD-10-CM | POA: Diagnosis present

## 2014-03-10 DIAGNOSIS — G20 Parkinson's disease: Secondary | ICD-10-CM | POA: Diagnosis not present

## 2014-03-10 DIAGNOSIS — K529 Noninfective gastroenteritis and colitis, unspecified: Secondary | ICD-10-CM | POA: Diagnosis not present

## 2014-03-10 DIAGNOSIS — D72829 Elevated white blood cell count, unspecified: Secondary | ICD-10-CM | POA: Insufficient documentation

## 2014-03-10 DIAGNOSIS — Z7901 Long term (current) use of anticoagulants: Secondary | ICD-10-CM | POA: Diagnosis not present

## 2014-03-10 DIAGNOSIS — R197 Diarrhea, unspecified: Secondary | ICD-10-CM

## 2014-03-10 HISTORY — DX: Unspecified dementia, unspecified severity, without behavioral disturbance, psychotic disturbance, mood disturbance, and anxiety: F03.90

## 2014-03-10 HISTORY — DX: Parkinson's disease: G20

## 2014-03-10 HISTORY — DX: Parkinson's disease without dyskinesia, without mention of fluctuations: G20.A1

## 2014-03-10 LAB — COMPREHENSIVE METABOLIC PANEL
ALBUMIN: 4.1 g/dL (ref 3.5–5.2)
ALT: 20 U/L (ref 0–35)
ANION GAP: 10 (ref 5–15)
AST: 29 U/L (ref 0–37)
Alkaline Phosphatase: 67 U/L (ref 39–117)
BILIRUBIN TOTAL: 0.8 mg/dL (ref 0.3–1.2)
BUN: 34 mg/dL — ABNORMAL HIGH (ref 6–23)
CALCIUM: 9.2 mg/dL (ref 8.4–10.5)
CO2: 23 mmol/L (ref 19–32)
CREATININE: 0.88 mg/dL (ref 0.50–1.10)
Chloride: 104 mmol/L (ref 96–112)
GFR calc Af Amer: 68 mL/min — ABNORMAL LOW (ref >90)
GFR, EST NON AFRICAN AMERICAN: 58 mL/min — AB (ref >90)
GLUCOSE: 232 mg/dL — AB (ref 70–99)
POTASSIUM: 4.2 mmol/L (ref 3.5–5.1)
SODIUM: 137 mmol/L (ref 135–145)
Total Protein: 8.3 g/dL (ref 6.0–8.3)

## 2014-03-10 LAB — CBC WITH DIFFERENTIAL/PLATELET
BASOS ABS: 0 10*3/uL (ref 0.0–0.1)
Basophils Relative: 0 % (ref 0–1)
EOS PCT: 0 % (ref 0–5)
Eosinophils Absolute: 0 10*3/uL (ref 0.0–0.7)
HCT: 46.8 % — ABNORMAL HIGH (ref 36.0–46.0)
Hemoglobin: 15.3 g/dL — ABNORMAL HIGH (ref 12.0–15.0)
Lymphocytes Relative: 1 % — ABNORMAL LOW (ref 12–46)
Lymphs Abs: 0.3 10*3/uL — ABNORMAL LOW (ref 0.7–4.0)
MCH: 29.8 pg (ref 26.0–34.0)
MCHC: 32.7 g/dL (ref 30.0–36.0)
MCV: 91.2 fL (ref 78.0–100.0)
MONO ABS: 0.6 10*3/uL (ref 0.1–1.0)
Monocytes Relative: 4 % (ref 3–12)
NEUTROS PCT: 95 % — AB (ref 43–77)
Neutro Abs: 17.3 10*3/uL — ABNORMAL HIGH (ref 1.7–7.7)
PLATELETS: 252 10*3/uL (ref 150–400)
RBC: 5.13 MIL/uL — ABNORMAL HIGH (ref 3.87–5.11)
RDW: 13.5 % (ref 11.5–15.5)
WBC: 18.2 10*3/uL — ABNORMAL HIGH (ref 4.0–10.5)

## 2014-03-10 LAB — URINALYSIS, ROUTINE W REFLEX MICROSCOPIC
Bilirubin Urine: NEGATIVE
GLUCOSE, UA: 250 mg/dL — AB
Ketones, ur: NEGATIVE mg/dL
Nitrite: POSITIVE — AB
Protein, ur: NEGATIVE mg/dL
SPECIFIC GRAVITY, URINE: 1.029 (ref 1.005–1.030)
Urobilinogen, UA: 0.2 mg/dL (ref 0.0–1.0)
pH: 5 (ref 5.0–8.0)

## 2014-03-10 LAB — URINE MICROSCOPIC-ADD ON

## 2014-03-10 LAB — CLOSTRIDIUM DIFFICILE BY PCR: CDIFFPCR: NEGATIVE

## 2014-03-10 LAB — LIPASE, BLOOD: Lipase: 73 U/L — ABNORMAL HIGH (ref 11–59)

## 2014-03-10 MED ORDER — ONDANSETRON HCL 4 MG/2ML IJ SOLN
4.0000 mg | Freq: Once | INTRAMUSCULAR | Status: AC
  Administered 2014-03-10: 4 mg via INTRAVENOUS
  Filled 2014-03-10: qty 2

## 2014-03-10 MED ORDER — ROSUVASTATIN CALCIUM 20 MG PO TABS
20.0000 mg | ORAL_TABLET | Freq: Every day | ORAL | Status: DC
  Administered 2014-03-10: 20 mg via ORAL
  Filled 2014-03-10 (×2): qty 1

## 2014-03-10 MED ORDER — ONDANSETRON HCL 4 MG/2ML IJ SOLN
4.0000 mg | Freq: Four times a day (QID) | INTRAMUSCULAR | Status: DC | PRN
  Administered 2014-03-10: 4 mg via INTRAVENOUS
  Filled 2014-03-10: qty 2

## 2014-03-10 MED ORDER — ONDANSETRON HCL 4 MG PO TABS: 4.0000 mg | ORAL_TABLET | Freq: Four times a day (QID) | ORAL | Status: DC | PRN

## 2014-03-10 MED ORDER — SACCHAROMYCES BOULARDII 250 MG PO CAPS: 250.0000 mg | ORAL_CAPSULE | Freq: Two times a day (BID) | ORAL | Status: DC

## 2014-03-10 MED ORDER — METOPROLOL SUCCINATE ER 25 MG PO TB24
25.0000 mg | ORAL_TABLET | Freq: Every day | ORAL | Status: DC
  Administered 2014-03-11: 25 mg via ORAL
  Filled 2014-03-10 (×2): qty 1

## 2014-03-10 MED ORDER — ACETAMINOPHEN 325 MG PO TABS: 650.0000 mg | ORAL_TABLET | Freq: Four times a day (QID) | ORAL | Status: DC | PRN

## 2014-03-10 MED ORDER — ACETAMINOPHEN 650 MG RE SUPP: 650.0000 mg | Freq: Four times a day (QID) | RECTAL | Status: DC | PRN

## 2014-03-10 MED ORDER — SODIUM CHLORIDE 0.9 % IV BOLUS (SEPSIS)
500.0000 mL | Freq: Once | INTRAVENOUS | Status: AC
  Administered 2014-03-10: 500 mL via INTRAVENOUS

## 2014-03-10 MED ORDER — CITALOPRAM HYDROBROMIDE 20 MG PO TABS
20.0000 mg | ORAL_TABLET | Freq: Every day | ORAL | Status: DC
  Administered 2014-03-11: 20 mg via ORAL
  Filled 2014-03-10 (×2): qty 1

## 2014-03-10 MED ORDER — SACCHAROMYCES BOULARDII 250 MG PO CAPS
250.0000 mg | ORAL_CAPSULE | Freq: Two times a day (BID) | ORAL | Status: DC
  Administered 2014-03-10 – 2014-03-11 (×2): 250 mg via ORAL
  Filled 2014-03-10 (×3): qty 1

## 2014-03-10 MED ORDER — MEMANTINE HCL ER 28 MG PO CP24
28.0000 mg | ORAL_CAPSULE | Freq: Every day | ORAL | Status: DC
  Administered 2014-03-11: 28 mg via ORAL
  Filled 2014-03-10 (×2): qty 1

## 2014-03-10 MED ORDER — DONEPEZIL HCL 10 MG PO TABS
10.0000 mg | ORAL_TABLET | Freq: Every day | ORAL | Status: DC
  Administered 2014-03-10: 10 mg via ORAL
  Filled 2014-03-10 (×2): qty 1

## 2014-03-10 MED ORDER — LIP MEDEX EX OINT
TOPICAL_OINTMENT | CUTANEOUS | Status: AC
  Administered 2014-03-10: 14:00:00
  Filled 2014-03-10: qty 7

## 2014-03-10 MED ORDER — TRAZODONE HCL 50 MG PO TABS
50.0000 mg | ORAL_TABLET | Freq: Every day | ORAL | Status: DC
  Administered 2014-03-10: 50 mg via ORAL
  Filled 2014-03-10 (×2): qty 1

## 2014-03-10 MED ORDER — SODIUM CHLORIDE 0.9 % IV SOLN
INTRAVENOUS | Status: DC
  Administered 2014-03-10 – 2014-03-11 (×2): via INTRAVENOUS

## 2014-03-10 MED ORDER — PROCHLORPERAZINE EDISYLATE 5 MG/ML IJ SOLN
10.0000 mg | Freq: Four times a day (QID) | INTRAMUSCULAR | Status: DC | PRN
  Administered 2014-03-10: 10 mg via INTRAVENOUS
  Filled 2014-03-10: qty 2

## 2014-03-10 MED ORDER — HEPARIN SODIUM (PORCINE) 5000 UNIT/ML IJ SOLN
5000.0000 [IU] | Freq: Three times a day (TID) | INTRAMUSCULAR | Status: DC
  Administered 2014-03-10 – 2014-03-11 (×3): 5000 [IU] via SUBCUTANEOUS
  Filled 2014-03-10 (×6): qty 1

## 2014-03-10 NOTE — ED Notes (Signed)
Pt reports only little nausea at present time.

## 2014-03-10 NOTE — ED Notes (Addendum)
Per MD turn off O2 and monitor O2 saturation. On RA pt 89%; pt placed on 1 lpm [place].

## 2014-03-10 NOTE — H&P (Signed)
[facility] History and Physical  Bonnie NorthDorothy J Mora RUE:[id number]:[id number] DOB: [birth year] DOA: 03/10/2014  Referring physician: Emergency Department PCP: No primary care provider on file.  Specialists:   Chief Complaint: N/V/D  HPI: Bonnie NorthDorothy J Mora is a 81 y.o. female  With a hx of dementia, htn, hld who presents to the ED with n/v and reported diarrhea. Sx started overnight after eating out with caregiver at restaurant. Caregiver also became ill that night and vomited once, but quickly resolved. Pt unfortunately continued to have vomiting through the AM. In the ED, pt was found to have a mildly elevated WBC and was unable to tolerate trial of PO. Hospitalist consulted for admission  Review of Systems:  Review of Systems  Constitutional: Positive for malaise/fatigue. Negative for fever.  HENT: Negative for ear discharge.   Eyes: Negative for blurred vision and discharge.  Respiratory: Negative for cough and wheezing.   Cardiovascular: Negative for chest pain and leg swelling.  Gastrointestinal: Positive for nausea, vomiting and diarrhea. Negative for constipation.  Genitourinary: Negative for frequency and flank pain.  Musculoskeletal: Negative for back pain and falls.  Skin: Negative for itching.  Neurological: Positive for weakness. Negative for speech change, focal weakness, seizures, loss of consciousness and headaches.  Psychiatric/Behavioral: Negative for depression and substance abuse. The patient is not nervous/anxious.      Past Medical History  Diagnosis Date  . Parkinson disease   . Dementia    Past Surgical History  Procedure Laterality Date  . Joint replacement      bilateral knee  . Appendectomy    . Ovarian cyst surgery     Social History:  reports that she has never smoked. She does not have any smokeless tobacco history on file. She reports that she does not drink alcohol or use illicit drugs.  where does patient live--home, ALF, SNF? and with whom if at home?  Can  patient participate in ADLs?  Allergies  Allergen Reactions  . Codeine Nausea And Vomiting    No family history on file. unable to obtain from patient given her dementia (be sure to complete)  Prior to Admission medications   Medication Sig Start Date End Date Taking? Authorizing Provider  citalopram (CELEXA) 20 MG tablet Take 20 mg by mouth daily with breakfast.   Yes Historical Provider, MD  donepezil (ARICEPT) 10 MG tablet Take 10 mg by mouth at bedtime.   Yes Historical Provider, MD  Influenza vac split quadrivalent PF (FLUARIX) 0.5 ML injection Inject 0.5 mLs into the muscle once.   Yes Historical Provider, MD  memantine (NAMENDA XR) 28 MG CP24 24 hr capsule Take 28 mg by mouth daily with breakfast.   Yes Historical Provider, MD  metoprolol succinate (TOPROL-XL) 25 MG 24 hr tablet Take 25 mg by mouth daily with breakfast.   Yes Historical Provider, MD  potassium chloride SA (K-DUR,KLOR-CON) 20 MEQ tablet Take 20 mEq by mouth daily with breakfast.   Yes Historical Provider, MD  rosuvastatin (CRESTOR) 20 MG tablet Take 20 mg by mouth at bedtime.   Yes Historical Provider, MD  traZODone (DESYREL) 50 MG tablet Take 50 mg by mouth at bedtime.   Yes Historical Provider, MD   Physical Exam: Filed Vitals:   03/10/14 [id number] 03/10/14 0747 03/10/14 0933 03/10/14 0933  BP: 113/96 165/63    Pulse: 110 88    Temp: 97.4 F (36.3 C)     TempSrc: Oral     Resp: 22 22    SpO2: 94% 98%  89% 92%     General:  Awake, in nad  Eyes: PERRL B  ENT: membranes dry, dentition fair  Neck: trachea midline, neck supple  Cardiovascular: regular, s1, s2  Respiratory: normal resp effort, no wheezing  Abdomen: soft, nondistended  Skin: decreased skin turgor, no abnormal skin lesions seen  Musculoskeletal: perfused, no clubbing  Psychiatric: mood/affect normal//no auditory/visual hallucinations  Neurologic: cn2-12 grossly intact, strength/sensation intact  Labs on Admission:  Basic Metabolic  Panel:  Recent Labs Lab 03/10/14 0700  NA 137  K 4.2  CL 104  CO2 23  GLUCOSE 232*  BUN 34*  CREATININE 0.88  CALCIUM 9.2   Liver Function Tests:  Recent Labs Lab 03/10/14 0700  AST 29  ALT 20  ALKPHOS 67  BILITOT 0.8  PROT 8.3  ALBUMIN 4.1    Recent Labs Lab 03/10/14 0700  LIPASE 73*   No results for input(s): AMMONIA in the last 168 hours. CBC:  Recent Labs Lab 03/10/14 0700  WBC 18.2*  NEUTROABS 17.3*  HGB 15.3*  HCT 46.8*  MCV 91.2  PLT 252   Cardiac Enzymes: No results for input(s): CKTOTAL, CKMB, CKMBINDEX, TROPONINI in the last 168 hours.  BNP (last 3 results) No results for input(s): BNP in the last 8760 hours.  ProBNP (last 3 results) No results for input(s): PROBNP in the last 8760 hours.  CBG: No results for input(s): GLUCAP in the last 168 hours.  Radiological Exams on Admission: Dg Chest 2 View  03/10/2014   CLINICAL DATA:  Shortness of breath and emesis  EXAM: CHEST  2 VIEW  COMPARISON:  08/20/2009  FINDINGS: Cardiac shadow is within normal limits. The lungs are well aerated bilaterally. No focal infiltrate or sizable effusion is seen. No bony abnormality is noted.  IMPRESSION: No active cardiopulmonary disease.   Electronically Signed   By: Alcide Clever M.D.   On: 03/10/2014 07:58    Assessment/Plan Active Problems:   Vomiting   Dementia   HLD (hyperlipidemia)   Essential hypertension  1. N/v/d 1. Known recent sick contact 2. Possible gastroenteritis 3. Will obtain abd imaging to r/o other acute processes 4. Cont on clear liquid diet 5. Cont on hydration as tolerated 6. Admit to med-surg, obs 2. HLD 1. Cont statin per home regimen 3. HTN 1. BP mildly elevated 2. Cont home regimen for now as pt presented acutely ill 4. Dementia 1. Stable 2. Cont home regimen 5. DVT prophylaxis 1. Heparin subQ  Code Status: Full (must indicate code status--if unknown or must be presumed, indicate so) Family Communication: Pt in room,  caregiver at bedside (indicate person spoken with, if applicable, with phone number if by telephone) Disposition Plan: Pending (indicate anticipated LOS)  CHIU, STEPHEN K [facility] Pager [phone]  If 7PM-7AM, please contact night-coverage [email or web address] Password [facility] 03/10/2014, 9:35 AM

## 2014-03-10 NOTE — ED Notes (Signed)
Patient went to x-ray I will perform EKG when patient return 

## 2014-03-10 NOTE — ED Provider Notes (Signed)
CSN: [id number]     Arrival date & time 03/10/14  0630 History   First MD Initiated Contact with Patient 03/10/14 [phone]     Chief Complaint  Patient presents with  . Emesis  . Diarrhea     Patient is a 81 y.o. female presenting with vomiting and diarrhea. No language interpreter was used.  Emesis Associated symptoms: diarrhea   Diarrhea Associated symptoms: vomiting    Ms. Homewood presents for evaluation of vomiting and diarrhea.  Hx provided by caregiver and daughter due to patient's dementia.  She had [employer] for dinner last night ([place] and stuffing).  Her care giver became sick with vomiting shortly after dinner.  Ms. Cordner woke up at 0130 with profuse vomiting and diarrhea.  No fevers, hematemesis, hematochezia, recent illness, abdominal pain.  Sxs are moderate and constant, worsening.    Past Medical History  Diagnosis Date  . Parkinson disease   . Dementia    Past Surgical History  Procedure Laterality Date  . Joint replacement      bilateral knee  . Appendectomy    . Ovarian cyst surgery     No family history on file. History  Substance Use Topics  . Smoking status: Never Smoker   . Smokeless tobacco: Not on file  . Alcohol Use: No   OB History    No data available     Review of Systems  Gastrointestinal: Positive for vomiting and diarrhea.  All other systems reviewed and are negative.     Allergies  Codeine  Home Medications   Prior to Admission medications   Not on File   BP 113/96 mmHg  Pulse 110  Temp(Src) 97.4 F (36.3 C) (Oral)  Resp 22  SpO2 94% Physical Exam  Constitutional: She appears well-developed and well-nourished.  Mild distress  HENT:  Head: Normocephalic and atraumatic.  Cardiovascular: Regular rhythm.   No murmur heard. tachycardic  Pulmonary/Chest: Effort normal. No respiratory distress.  Decreased air movement bilaterally  Abdominal: Soft. There is no tenderness. There is no rebound and no guarding.  Musculoskeletal:  She exhibits no edema or tenderness.  Neurological: She is alert.  Pleasantly confused  Skin: Skin is warm and dry. There is pallor.  Psychiatric: She has a normal mood and affect. Her behavior is normal.  Nursing note and vitals reviewed.   ED Course  Procedures (including critical care time) Labs Review Labs Reviewed  CBC WITH DIFFERENTIAL/PLATELET - Abnormal; Notable for the following:    WBC 18.2 (*)    RBC 5.13 (*)    Hemoglobin 15.3 (*)    HCT 46.8 (*)    Neutrophils Relative % 95 (*)    Neutro Abs 17.3 (*)    Lymphocytes Relative 1 (*)    Lymphs Abs 0.3 (*)    All other components within normal limits  COMPREHENSIVE METABOLIC PANEL - Abnormal; Notable for the following:    Glucose, Bld 232 (*)    BUN 34 (*)    GFR calc non Af Amer 58 (*)    GFR calc Af Amer 68 (*)    All other components within normal limits  LIPASE, BLOOD - Abnormal; Notable for the following:    Lipase 73 (*)    All other components within normal limits  URINALYSIS, ROUTINE W REFLEX MICROSCOPIC  I-STAT TROPOININ, ED    Imaging Review Dg Chest 2 View  03/10/2014   CLINICAL DATA:  Shortness of breath and emesis  EXAM: CHEST  2 VIEW  COMPARISON:  08/20/2009  FINDINGS: Cardiac shadow is within normal limits. The lungs are well aerated bilaterally. No focal infiltrate or sizable effusion is seen. No bony abnormality is noted.  IMPRESSION: No active cardiopulmonary disease.   Electronically Signed   By: [name]  Lukens M.D.   On: 03/10/2014 07:58     EKG Interpretation None      MDM   Final diagnoses:  Nausea, vomiting, and diarrhea    Patient here for evaluation of vomiting and diarrhea. Patient with likely food poisoning. On recheck in the emergency Department patient has recurrent nausea and burping when attempting oral fluids. CBC with leukocytosis, likely due to margination related to emesis. Patient is a little dehydrated with elevation in her BUN. Providing IV fluids. Discussed medicine  regarding admission for persistent vomiting and diarrhea with dehydration.    Tilden FossaElizabeth Muath Hallam, MD 03/10/14 1000

## 2014-03-10 NOTE — ED Notes (Signed)
Pt complaint of increased at present time. Will hold off on fluid/PO challenge until discussed with MD.

## 2014-03-10 NOTE — ED Notes (Signed)
MD at bedside. 

## 2014-03-10 NOTE — ED Notes (Signed)
MD reports continue to offer fluid/PO challenge. Last zofran dose at 0705. Reevaluate nausea/vomiting post attempt for fluid/PO challenge.

## 2014-03-10 NOTE — Progress Notes (Signed)
Pt confirms pcp as Bonnie Mora EPIC updated

## 2014-03-10 NOTE — ED Notes (Signed)
Pt states that she began vomiting and diarrhea at 0130 this am; pt states that she has had multiple episodes of vomiting and diarrhea; c/o abd cramping

## 2014-03-10 NOTE — ED Notes (Signed)
Denies fever, blood in emesis. Pt has Dementia. Care taker and daughter at bedside

## 2014-03-10 NOTE — ED Notes (Signed)
Placed on 3 L [place] o2 sat 88

## 2014-03-10 NOTE — ED Notes (Signed)
Pt able to tolerate small sips of ginger ale without active vomiting but still reports nausea. MD aware.

## 2014-03-10 NOTE — ED Notes (Addendum)
Post report tp floor pt reports small diarrhea episode. Pt diaper disposed over with scant amount of watery diarrhea; pericare done; and incontinence pad placed beneath pt.

## 2014-03-11 DIAGNOSIS — G43A1 Cyclical vomiting, intractable: Secondary | ICD-10-CM

## 2014-03-11 DIAGNOSIS — K529 Noninfective gastroenteritis and colitis, unspecified: Secondary | ICD-10-CM

## 2014-03-11 LAB — CBC
HCT: 39 % (ref 36.0–46.0)
HEMOGLOBIN: 12 g/dL (ref 12.0–15.0)
MCH: 28.6 pg (ref 26.0–34.0)
MCHC: 30.8 g/dL (ref 30.0–36.0)
MCV: 93.1 fL (ref 78.0–100.0)
Platelets: 206 10*3/uL (ref 150–400)
RBC: 4.19 MIL/uL (ref 3.87–5.11)
RDW: 14.1 % (ref 11.5–15.5)
WBC: 10.4 10*3/uL (ref 4.0–10.5)

## 2014-03-11 LAB — COMPREHENSIVE METABOLIC PANEL
ALT: 15 U/L (ref 0–35)
ANION GAP: 5 (ref 5–15)
AST: 27 U/L (ref 0–37)
Albumin: 3.1 g/dL — ABNORMAL LOW (ref 3.5–5.2)
Alkaline Phosphatase: 41 U/L (ref 39–117)
BUN: 26 mg/dL — ABNORMAL HIGH (ref 6–23)
CALCIUM: 8.3 mg/dL — AB (ref 8.4–10.5)
CO2: 28 mmol/L (ref 19–32)
CREATININE: 0.85 mg/dL (ref 0.50–1.10)
Chloride: 103 mmol/L (ref 96–112)
GFR calc non Af Amer: 61 mL/min — ABNORMAL LOW (ref >90)
GFR, EST AFRICAN AMERICAN: 70 mL/min — AB (ref >90)
Glucose, Bld: 115 mg/dL — ABNORMAL HIGH (ref 70–99)
Potassium: 3.5 mmol/L (ref 3.5–5.1)
Sodium: 136 mmol/L (ref 135–145)
TOTAL PROTEIN: 6.3 g/dL (ref 6.0–8.3)
Total Bilirubin: 0.5 mg/dL (ref 0.3–1.2)

## 2014-03-11 MED ORDER — SACCHAROMYCES BOULARDII 250 MG PO CAPS: 250.0000 mg | ORAL_CAPSULE | Freq: Two times a day (BID) | ORAL | Status: DC

## 2014-03-11 NOTE — Discharge Summary (Signed)
Physician Discharge Summary  Bonnie Mora WGN:[id number] DOB: 12-18-1926 DOA: 03/10/2014  PCP: Bonnie Bamberger, NP  Admit date: 03/10/2014 Discharge date: 03/11/2014  Time spent: 30 minutes  Recommendations for Outpatient Follow-up:  1. Follow up with PCP in 2 weeks.  Discharge Diagnoses:  Active Problems:   Vomiting   Dementia   HLD (hyperlipidemia)   Essential hypertension   Gastroenteritis   Discharge Condition: stable  Diet recommendation: heart healthy  There were no vitals filed for this visit.  History of present illness:  81 y.o. female  With a hx of dementia, htn, hld who presents to the ED with n/v and reported diarrhea. Sx started overnight after eating out with caregiver at restaurant. Caregiver also became ill that night and vomited once, but quickly resolved. Pt unfortunately continued to have vomiting through the AM. In the ED, pt was found to have a mildly elevated WBC and was unable to tolerate trial of PO. Hospitalist consulted for admission  Hospital Course:  Nausea/vomiting and diarrhea: - Abd x-ray showed no acute abnormalities. - Was start on IV fluids. - Tolerate her diet. C. Dif negative no fever overnight leukocytosis improved with conservative management.  HLD: - Cont statin per home regimen no changes were made.  HTN: - BP mildly elevated, no changes made. - Cont home regimen for now as pt presented acutely ill  Dementia: - Stable. - Cont home regimen  Procedures:  ABd x-ray  Consultations:  none  Discharge Exam: Filed Vitals:   03/11/14 0532  BP: 154/56  Pulse: 85  Temp: 98.4 F (36.9 C)  Resp: 18    General: A&O x2 Cardiovascular: RRR Respiratory: good air movement Abd: +BS non distended, non tender.  Discharge Instructions   Discharge Instructions    Diet - low sodium heart healthy    Complete by:  As directed      Increase activity slowly    Complete by:  As directed           Current Discharge Medication List     START taking these medications   Details  saccharomyces boulardii (FLORASTOR) 250 MG capsule Take 1 capsule (250 mg total) by mouth 2 (two) times daily. Qty: 15 capsule, Refills: 0      CONTINUE these medications which have NOT CHANGED   Details  citalopram (CELEXA) 20 MG tablet Take 20 mg by mouth daily with breakfast.    donepezil (ARICEPT) 10 MG tablet Take 10 mg by mouth at bedtime.    memantine (NAMENDA XR) 28 MG CP24 24 hr capsule Take 28 mg by mouth daily with breakfast.    metoprolol succinate (TOPROL-XL) 25 MG 24 hr tablet Take 25 mg by mouth daily with breakfast.    potassium chloride SA (K-DUR,KLOR-CON) 20 MEQ tablet Take 20 mEq by mouth daily with breakfast.    rosuvastatin (CRESTOR) 20 MG tablet Take 20 mg by mouth at bedtime.    traZODone (DESYREL) 50 MG tablet Take 50 mg by mouth at bedtime.      STOP taking these medications     Influenza vac split quadrivalent PF (FLUARIX) 0.5 ML injection        Allergies  Allergen Reactions  . Codeine Nausea And Vomiting      The results of significant diagnostics from this hospitalization (including imaging, microbiology, ancillary and laboratory) are listed below for reference.    Significant Diagnostic Studies: Dg Chest 2 View  03/10/2014   CLINICAL DATA:  Shortness of breath and emesis  EXAM: CHEST  2 VIEW  COMPARISON:  08/20/2009  FINDINGS: Cardiac shadow is within normal limits. The lungs are well aerated bilaterally. No focal infiltrate or sizable effusion is seen. No bony abnormality is noted.  IMPRESSION: No active cardiopulmonary disease.   Electronically Signed   By: [name]  Lukens M.D.   On: 03/10/2014 07:58   Acute Abdominal Series  03/10/2014   CLINICAL DATA:  Constipation  EXAM: ACUTE ABDOMEN SERIES (ABDOMEN 2 VIEW & CHEST 1 VIEW)  COMPARISON:  03/10/2014 chest x-ray  FINDINGS: The bowel gas pattern is normal and there is no abnormal increase in stool. No evidence for pneumoperitoneum or pneumatosis. No  concerning intra-abdominal mass effect or calcification.  Subpleural densities in the left more than right lung bases is mildly increased from priors, favor progressive scarring or atelectasis superimposed on scar. No convincing pneumonia or edema. No effusion or pneumothorax. No cardiomegaly. Stable aortic contours.  IMPRESSION: No evidence of constipation or bowel obstruction.   Electronically Signed   By: Marnee SpringJonathon  Watts M.D.   On: 03/10/2014 15:21    Microbiology: Recent Results (from the past 240 hour(s))  Clostridium Difficile by PCR     Status: None   Collection Time: 03/10/14  3:36 PM  Result Value Ref Range Status   C difficile by pcr NEGATIVE NEGATIVE Final    Comment: [facility] [place]     Labs: Basic Metabolic Panel:  Recent Labs Lab 03/10/14 0700 03/11/14 0508  NA 137 136  K 4.2 3.5  CL 104 103  CO2 23 28  GLUCOSE 232* 115*  BUN 34* 26*  CREATININE 0.88 0.85  CALCIUM 9.2 8.3*   Liver Function Tests:  Recent Labs Lab 03/10/14 0700 03/11/14 0508  AST 29 27  ALT 20 15  ALKPHOS 67 41  BILITOT 0.8 0.5  PROT 8.3 6.3  ALBUMIN 4.1 3.1*    Recent Labs Lab 03/10/14 0700  LIPASE 73*   No results for input(s): AMMONIA in the last 168 hours. CBC:  Recent Labs Lab 03/10/14 0700 03/11/14 0508  WBC 18.2* 10.4  NEUTROABS 17.3*  --   HGB 15.3* 12.0  HCT 46.8* 39.0  MCV 91.2 93.1  PLT 252 206   Cardiac Enzymes: No results for input(s): CKTOTAL, CKMB, CKMBINDEX, TROPONINI in the last 168 hours. BNP: BNP (last 3 results) No results for input(s): BNP in the last 8760 hours.  ProBNP (last 3 results) No results for input(s): PROBNP in the last 8760 hours.  CBG: No results for input(s): GLUCAP in the last 168 hours.     Signed:  Marinda ElkFELIZ ORTIZ, ABRAHAM  [facility] 03/11/2014, 8:29 AM

## 2014-03-11 NOTE — Progress Notes (Signed)
Utilization Review completed.  

## 2014-03-14 LAB — STOOL CULTURE

## 2014-10-25 ENCOUNTER — Telehealth: Payer: Self-pay | Admitting: Internal Medicine

## 2014-10-25 NOTE — Telephone Encounter (Signed)
Rec'd from [facility] forward 42 pages to Dr. Dorise Hiss

## 2014-10-30 ENCOUNTER — Encounter: Payer: Self-pay | Admitting: Internal Medicine

## 2014-10-30 ENCOUNTER — Ambulatory Visit (INDEPENDENT_AMBULATORY_CARE_PROVIDER_SITE_OTHER): Payer: Medicare Other | Admitting: Internal Medicine

## 2014-10-30 VITALS — BP 150/62 | HR 91 | Temp 98.1°F | Resp 16 | Ht 62.0 in | Wt 161.0 lb

## 2014-10-30 DIAGNOSIS — F0390 Unspecified dementia without behavioral disturbance: Secondary | ICD-10-CM

## 2014-10-30 DIAGNOSIS — K591 Functional diarrhea: Secondary | ICD-10-CM

## 2014-10-30 NOTE — Progress Notes (Signed)
Pre visit review using our clinic review tool, if applicable. No additional management support is needed unless otherwise documented below in the visit note. 

## 2014-10-30 NOTE — Patient Instructions (Signed)
We will have you try taking some over the counter fiber every morning to help with the bowels. If you do not notice a difference in 2 weeks call the office back.  We may have you try imodium daily to try to help with the bowels if the fiber does not work. We recommend starting off taking 1 pill in the morning daily, if you are still having loose stools after eating take 1 pill in the morning and 1 pill with lunch. With imodium if you are constipated that is when you are taking too much. Everyone is slightly different in how much imodium they need to help their bowels. If you get constipated on it back off and take less imodium.

## 2014-11-03 DIAGNOSIS — R197 Diarrhea, unspecified: Secondary | ICD-10-CM | POA: Insufficient documentation

## 2014-11-03 NOTE — Assessment & Plan Note (Signed)
She is taking aricept and namenda for her symptoms. Due to the acute nature of her other concerns we were not able to address this fully. It is possible that her aricept is causing some of the symptoms of her bowel urgency.

## 2014-11-03 NOTE — Progress Notes (Signed)
   Subjective:    Patient ID: Bonnie Mora, female    DOB: [birth year], 81 y.o.   MRN: [id number]  HPI The patient is an 81 YO female coming in new for multiple bowel movements per day. She does have dementia and her 2 daughters are with her to help provide history. She does have bowel movements after eating most of the time. She has urgency with them and uses padding to help decrease accidents. Does get worse problems with salads and other things. Denies blood in the bowel movements. Has not tried anything for the bowel movements. Is losing some weight and appetite is not what it used to be. Her dementia is fairly severe and she is on 2 memory medications.   [facility], [facility], social history reviewed and updated.   Review of Systems  Unable to perform ROS: Dementia  Constitutional: Positive for appetite change. Negative for fever, chills, activity change, fatigue and unexpected weight change.  HENT: Negative.   Eyes: Negative.   Respiratory: Negative for cough, chest tightness, shortness of breath and wheezing.   Cardiovascular: Negative for chest pain, palpitations and leg swelling.  Gastrointestinal: Positive for diarrhea. Negative for nausea, vomiting, abdominal pain, constipation, blood in stool and abdominal distention.      Objective:   Physical Exam  Constitutional: She is oriented to person, place, and time. She appears well-developed and well-nourished.  HENT:  Head: Normocephalic and atraumatic.  Eyes: EOM are normal.  Neck: Normal range of motion.  Cardiovascular: Normal rate and regular rhythm.   Pulmonary/Chest: Effort normal and breath sounds normal. No respiratory distress. She has no wheezes. She has no rales.  Abdominal: Soft. Bowel sounds are normal. She exhibits no distension. There is no tenderness. There is no rebound.  Musculoskeletal: She exhibits no edema.  Neurological: She is alert and oriented to person, place, and time. Coordination normal.  Skin: Skin is warm and  dry.  Psychiatric: She has a normal mood and affect.   Filed Vitals:   10/30/14 1552  BP: 150/62  Pulse: 91  Temp: 98.1 F (36.7 C)  TempSrc: Oral  Resp: 16  Height: 5\' 2"  (1.575 m)  Weight: 161 lb (73.029 kg)  SpO2: 94%      Assessment & Plan:

## 2014-11-03 NOTE — Assessment & Plan Note (Signed)
Sounds to be exaggerated reflex after eating. Have advised them to start taking metamucil or fiber daily to help with consistency of her bowel movements. If no improvement in 1-2 weeks they will start daily imodium in the morning to see if this helps with the urgency. Would not recommend further evaluation. Will monitor for objective weight loss (review of the medical records does not produce past weights).

## 2014-11-28 ENCOUNTER — Other Ambulatory Visit: Payer: Self-pay | Admitting: Internal Medicine

## 2014-12-04 ENCOUNTER — Other Ambulatory Visit: Payer: Self-pay | Admitting: Internal Medicine

## 2015-01-01 ENCOUNTER — Other Ambulatory Visit: Payer: Self-pay | Admitting: Internal Medicine

## 2015-01-02 ENCOUNTER — Other Ambulatory Visit: Payer: Self-pay | Admitting: Internal Medicine

## 2015-01-07 ENCOUNTER — Emergency Department (HOSPITAL_COMMUNITY): Payer: Medicare Other

## 2015-01-07 ENCOUNTER — Other Ambulatory Visit (HOSPITAL_COMMUNITY): Payer: Medicare Other

## 2015-01-07 ENCOUNTER — Encounter (HOSPITAL_COMMUNITY): Payer: Self-pay | Admitting: Emergency Medicine

## 2015-01-07 ENCOUNTER — Inpatient Hospital Stay (HOSPITAL_COMMUNITY)
Admission: EM | Admit: 2015-01-07 | Discharge: 2015-01-11 | DRG: 193 | Disposition: A | Discharge status: Home / Self Care | Payer: Medicare Other | Attending: Family Medicine | Admitting: Family Medicine

## 2015-01-07 DIAGNOSIS — G20 Parkinson's disease: Secondary | ICD-10-CM | POA: Diagnosis present

## 2015-01-07 DIAGNOSIS — J9601 Acute respiratory failure with hypoxia: Secondary | ICD-10-CM | POA: Diagnosis present

## 2015-01-07 DIAGNOSIS — Z66 Do not resuscitate: Secondary | ICD-10-CM | POA: Diagnosis present

## 2015-01-07 DIAGNOSIS — Z23 Encounter for immunization: Secondary | ICD-10-CM | POA: Diagnosis not present

## 2015-01-07 DIAGNOSIS — J189 Pneumonia, unspecified organism: Secondary | ICD-10-CM

## 2015-01-07 DIAGNOSIS — I10 Essential (primary) hypertension: Secondary | ICD-10-CM | POA: Diagnosis not present

## 2015-01-07 DIAGNOSIS — R0902 Hypoxemia: Secondary | ICD-10-CM

## 2015-01-07 DIAGNOSIS — J101 Influenza due to other identified influenza virus with other respiratory manifestations: Principal | ICD-10-CM | POA: Diagnosis present

## 2015-01-07 DIAGNOSIS — E785 Hyperlipidemia, unspecified: Secondary | ICD-10-CM

## 2015-01-07 DIAGNOSIS — F0280 Dementia in other diseases classified elsewhere without behavioral disturbance: Secondary | ICD-10-CM | POA: Diagnosis present

## 2015-01-07 DIAGNOSIS — G309 Alzheimer's disease, unspecified: Secondary | ICD-10-CM | POA: Diagnosis present

## 2015-01-07 DIAGNOSIS — F329 Major depressive disorder, single episode, unspecified: Secondary | ICD-10-CM | POA: Diagnosis present

## 2015-01-07 DIAGNOSIS — Z8249 Family history of ischemic heart disease and other diseases of the circulatory system: Secondary | ICD-10-CM | POA: Diagnosis not present

## 2015-01-07 DIAGNOSIS — R059 Cough, unspecified: Secondary | ICD-10-CM

## 2015-01-07 DIAGNOSIS — R062 Wheezing: Secondary | ICD-10-CM

## 2015-01-07 DIAGNOSIS — R531 Weakness: Secondary | ICD-10-CM

## 2015-01-07 DIAGNOSIS — R509 Fever, unspecified: Secondary | ICD-10-CM

## 2015-01-07 DIAGNOSIS — R05 Cough: Secondary | ICD-10-CM | POA: Diagnosis not present

## 2015-01-07 LAB — URINALYSIS, ROUTINE W REFLEX MICROSCOPIC
BILIRUBIN URINE: NEGATIVE
GLUCOSE, UA: 250 mg/dL — AB
HGB URINE DIPSTICK: NEGATIVE
KETONES UR: NEGATIVE mg/dL
Leukocytes, UA: NEGATIVE
Nitrite: NEGATIVE
PH: 5 (ref 5.0–8.0)
Protein, ur: NEGATIVE mg/dL
SPECIFIC GRAVITY, URINE: 1.016 (ref 1.005–1.030)

## 2015-01-07 LAB — CBC WITH DIFFERENTIAL/PLATELET
Basophils Absolute: 0 10*3/uL (ref 0.0–0.1)
Basophils Relative: 0 %
EOS PCT: 0 %
Eosinophils Absolute: 0 10*3/uL (ref 0.0–0.7)
HEMATOCRIT: 40.1 % (ref 36.0–46.0)
Hemoglobin: 12.6 g/dL (ref 12.0–15.0)
LYMPHS ABS: 1.7 10*3/uL (ref 0.7–4.0)
LYMPHS PCT: 24 %
MCH: 29.2 pg (ref 26.0–34.0)
MCHC: 31.4 g/dL (ref 30.0–36.0)
MCV: 92.8 fL (ref 78.0–100.0)
Monocytes Absolute: 0.4 10*3/uL (ref 0.1–1.0)
Monocytes Relative: 6 %
NEUTROS ABS: 4.9 10*3/uL (ref 1.7–7.7)
Neutrophils Relative %: 70 %
PLATELETS: 168 10*3/uL (ref 150–400)
RBC: 4.32 MIL/uL (ref 3.87–5.11)
RDW: 14.2 % (ref 11.5–15.5)
WBC: 7 10*3/uL (ref 4.0–10.5)

## 2015-01-07 LAB — COMPREHENSIVE METABOLIC PANEL
ALT: 18 U/L (ref 14–54)
AST: 31 U/L (ref 15–41)
Albumin: 3.9 g/dL (ref 3.5–5.0)
Alkaline Phosphatase: 51 U/L (ref 38–126)
Anion gap: 14 (ref 5–15)
BUN: 24 mg/dL — ABNORMAL HIGH (ref 6–20)
CHLORIDE: 101 mmol/L (ref 101–111)
CO2: 25 mmol/L (ref 22–32)
CREATININE: 0.89 mg/dL (ref 0.44–1.00)
Calcium: 9.1 mg/dL (ref 8.9–10.3)
GFR calc non Af Amer: 57 mL/min — ABNORMAL LOW (ref >60)
Glucose, Bld: 172 mg/dL — ABNORMAL HIGH (ref 65–99)
POTASSIUM: 3.3 mmol/L — AB (ref 3.5–5.1)
Sodium: 140 mmol/L (ref 135–145)
TOTAL PROTEIN: 7.1 g/dL (ref 6.5–8.1)
Total Bilirubin: 0.7 mg/dL (ref 0.3–1.2)

## 2015-01-07 LAB — I-STAT TROPONIN, ED: Troponin i, poc: 0.01 ng/mL (ref 0.00–0.08)

## 2015-01-07 LAB — I-STAT CG4 LACTIC ACID, ED: LACTIC ACID, VENOUS: 2.9 mmol/L — AB (ref 0.5–2.0)

## 2015-01-07 MED ORDER — CITALOPRAM HYDROBROMIDE 20 MG PO TABS
20.0000 mg | ORAL_TABLET | Freq: Every day | ORAL | Status: DC
  Administered 2015-01-07 – 2015-01-11 (×5): 20 mg via ORAL
  Filled 2015-01-07 (×5): qty 1

## 2015-01-07 MED ORDER — ALBUTEROL SULFATE (2.5 MG/3ML) 0.083% IN NEBU
2.5000 mg | INHALATION_SOLUTION | RESPIRATORY_TRACT | Status: DC | PRN
  Administered 2015-01-08: 2.5 mg via RESPIRATORY_TRACT
  Filled 2015-01-07: qty 3

## 2015-01-07 MED ORDER — DEXTROSE 5 % IV SOLN
1.0000 g | Freq: Once | INTRAVENOUS | Status: AC
  Administered 2015-01-07: 1 g via INTRAVENOUS
  Filled 2015-01-07: qty 10

## 2015-01-07 MED ORDER — SODIUM CHLORIDE 0.9 % IV BOLUS (SEPSIS)
1000.0000 mL | Freq: Once | INTRAVENOUS | Status: AC
  Administered 2015-01-07: 1000 mL via INTRAVENOUS

## 2015-01-07 MED ORDER — OSELTAMIVIR PHOSPHATE 30 MG PO CAPS
30.0000 mg | ORAL_CAPSULE | Freq: Two times a day (BID) | ORAL | Status: DC
  Administered 2015-01-07 – 2015-01-11 (×8): 30 mg via ORAL
  Filled 2015-01-07 (×10): qty 1

## 2015-01-07 MED ORDER — POTASSIUM CHLORIDE CRYS ER 10 MEQ PO TBCR
10.0000 meq | EXTENDED_RELEASE_TABLET | Freq: Every day | ORAL | Status: DC
  Administered 2015-01-07 – 2015-01-11 (×5): 10 meq via ORAL
  Filled 2015-01-07 (×5): qty 1

## 2015-01-07 MED ORDER — METOPROLOL SUCCINATE ER 25 MG PO TB24
25.0000 mg | ORAL_TABLET | Freq: Every day | ORAL | Status: DC
  Administered 2015-01-08 – 2015-01-11 (×4): 25 mg via ORAL
  Filled 2015-01-07 (×4): qty 1

## 2015-01-07 MED ORDER — SODIUM CHLORIDE 0.9 % IV SOLN
INTRAVENOUS | Status: DC
  Administered 2015-01-07: 75 mL/h via INTRAVENOUS

## 2015-01-07 MED ORDER — ATORVASTATIN CALCIUM 10 MG PO TABS
20.0000 mg | ORAL_TABLET | Freq: Every day | ORAL | Status: DC
  Administered 2015-01-07 – 2015-01-10 (×4): 20 mg via ORAL
  Filled 2015-01-07 (×2): qty 2
  Filled 2015-01-07: qty 1
  Filled 2015-01-07: qty 2

## 2015-01-07 MED ORDER — SODIUM CHLORIDE 0.9 % IV SOLN: Freq: Once | INTRAVENOUS | Status: DC

## 2015-01-07 MED ORDER — IPRATROPIUM-ALBUTEROL 0.5-2.5 (3) MG/3ML IN SOLN
3.0000 mL | Freq: Once | RESPIRATORY_TRACT | Status: AC
  Administered 2015-01-07: 3 mL via RESPIRATORY_TRACT
  Filled 2015-01-07: qty 3

## 2015-01-07 MED ORDER — SODIUM CHLORIDE 0.9 % IV SOLN: INTRAVENOUS | Status: DC

## 2015-01-07 MED ORDER — TRAZODONE HCL 50 MG PO TABS
50.0000 mg | ORAL_TABLET | Freq: Every evening | ORAL | Status: DC | PRN
  Administered 2015-01-07 – 2015-01-10 (×3): 100 mg via ORAL
  Filled 2015-01-07 (×3): qty 2

## 2015-01-07 MED ORDER — DEXTROSE 5 % IV SOLN: 500.0000 mg | INTRAVENOUS | Status: DC

## 2015-01-07 MED ORDER — DEXTROSE 5 % IV SOLN
500.0000 mg | Freq: Once | INTRAVENOUS | Status: AC
  Administered 2015-01-07: 500 mg via INTRAVENOUS
  Filled 2015-01-07: qty 500

## 2015-01-07 MED ORDER — DONEPEZIL HCL 5 MG PO TABS
10.0000 mg | ORAL_TABLET | Freq: Every day | ORAL | Status: DC
  Administered 2015-01-07 – 2015-01-10 (×4): 10 mg via ORAL
  Filled 2015-01-07 (×2): qty 2
  Filled 2015-01-07: qty 1
  Filled 2015-01-07: qty 2

## 2015-01-07 MED ORDER — ONDANSETRON HCL 4 MG/2ML IJ SOLN: 4.0000 mg | Freq: Three times a day (TID) | INTRAMUSCULAR | Status: AC | PRN

## 2015-01-07 MED ORDER — MEMANTINE HCL ER 28 MG PO CP24
28.0000 mg | ORAL_CAPSULE | Freq: Every day | ORAL | Status: DC
Start: 2015-01-08 — End: 2015-01-11
  Administered 2015-01-08 – 2015-01-11 (×4): 28 mg via ORAL
  Filled 2015-01-07 (×5): qty 1

## 2015-01-07 MED ORDER — ALBUTEROL SULFATE (2.5 MG/3ML) 0.083% IN NEBU
5.0000 mg | INHALATION_SOLUTION | Freq: Once | RESPIRATORY_TRACT | Status: AC
  Administered 2015-01-07: 5 mg via RESPIRATORY_TRACT
  Filled 2015-01-07: qty 6

## 2015-01-07 MED ORDER — SACCHAROMYCES BOULARDII 250 MG PO CAPS
250.0000 mg | ORAL_CAPSULE | Freq: Two times a day (BID) | ORAL | Status: DC
  Administered 2015-01-07 – 2015-01-11 (×8): 250 mg via ORAL
  Filled 2015-01-07 (×8): qty 1

## 2015-01-07 MED ORDER — DEXTROSE 5 % IV SOLN
500.0000 mg | INTRAVENOUS | Status: DC
  Administered 2015-01-08: 500 mg via INTRAVENOUS
  Filled 2015-01-07 (×2): qty 500

## 2015-01-07 MED ORDER — HEPARIN SODIUM (PORCINE) 5000 UNIT/ML IJ SOLN
5000.0000 [IU] | Freq: Three times a day (TID) | INTRAMUSCULAR | Status: DC
  Administered 2015-01-07 – 2015-01-11 (×11): 5000 [IU] via SUBCUTANEOUS
  Filled 2015-01-07 (×11): qty 1

## 2015-01-07 MED ORDER — ACETAMINOPHEN 325 MG PO TABS
650.0000 mg | ORAL_TABLET | Freq: Once | ORAL | Status: AC
  Administered 2015-01-07: 650 mg via ORAL
  Filled 2015-01-07: qty 2

## 2015-01-07 MED ORDER — DEXTROSE 5 % IV SOLN
1.0000 g | INTRAVENOUS | Status: DC
  Administered 2015-01-08 – 2015-01-09 (×2): 1 g via INTRAVENOUS
  Filled 2015-01-07 (×2): qty 10

## 2015-01-07 NOTE — ED Notes (Signed)
Bed: [id number] Expected date:  Expected time:  Means of arrival:  Comments: EMS-weakness x2hr, cough

## 2015-01-07 NOTE — ED Notes (Signed)
RN and PA made aware of critical istat result

## 2015-01-07 NOTE — ED Notes (Addendum)
Per EMS patient has has weakness in bilateral legs x3 hours (usually walks without any assistance/difficulty; difficulty getting out of bed today). No Neuro deficits per EMS (smiles symmetrical, no arm drift) Dry hacking cough x1 day; expiratory wheezes (5 albuterol, 125 solu-medrol en route)

## 2015-01-07 NOTE — Progress Notes (Signed)
Pharmacy Consult - Antibiotic Renal Dose Adjustment  Assessment: [age] yoF admitted with cough and weakness started azithromycin 500 mg IV q24h and ceftriaxone 1g IV q24h for CAP vs bronchitis as well as Tamiflu 75 mg BID for r/o influenza (PCR pending).  CrCl~51 ml/min.  Plan: - Continue azithromycin and ceftriaxone as ordered as these require no renal dose adjustments. - Reduce Tamiflu to 30 mg BID for CrCl 30-60 ml/min.  Clance BollAmanda Bentley Haralson, PharmD, BCPS Pager: [phone] 01/07/2015 9:14 PM

## 2015-01-07 NOTE — ED Notes (Signed)
Pt requesting to try to use bathroom after some fluids

## 2015-01-07 NOTE — ED Provider Notes (Signed)
CSN: [id number]     Arrival date & time 01/07/15  1745 History   First MD Initiated Contact with Patient 01/07/15 1757     Chief Complaint  Patient presents with  . Weakness  . Cough     (Consider location/radiation/quality/duration/timing/severity/associated sxs/prior Treatment) HPI Bonnie Mora is a 81 y.o. female with parkinson disease, dementia, htn, presents to ED with complaint of cough and weakness. Patient is a poor historian due to Alzheimer's disease. Son is providing most of the history. Patient is coming from home. Son states that patient developed cough yesterday. Today patient developed increased weakness and was unable to get out of bed. Patient denies any complaints, she states she is coughing. Denies any pain.  Family was not aware that patient had a fever. Family did not report any other complaints. No nausea or vomiting. No diarrhea. No recent ill contacts.  Past Medical History  Diagnosis Date  . Parkinson disease (HCC)   . Dementia   . Hyperlipidemia   . Hypertension    Past Surgical History  Procedure Laterality Date  . Joint replacement      bilateral knee  . Appendectomy    . Ovarian cyst surgery    . Tonsillectomy and adenoidectomy     Family History  Problem Relation Age of Onset  . ALS Mother   . Heart disease Father   . Heart disease Maternal Grandfather   . Cancer Maternal Grandfather     breast   Social History  Substance Use Topics  . Smoking status: Never Smoker   . Smokeless tobacco: None  . Alcohol Use: No   OB History    No data available     Review of Systems  Unable to perform ROS: Dementia  Constitutional: Positive for fever and fatigue.  Respiratory: Positive for cough.   Neurological: Positive for weakness. Negative for dizziness.  All other systems reviewed and are negative.     Allergies  Codeine  Home Medications   Prior to Admission medications   Medication Sig Start Date End Date Taking? Authorizing Provider   atorvastatin (LIPITOR) 20 MG tablet TAKE 1 TABLET AT BEDTIME FOR CHOLESTEROL. 01/01/15   [name], MD  citalopram (CELEXA) 20 MG tablet TAKE 1 TABLET EACH DAY. 12/04/14   [name], MD  donepezil (ARICEPT) 10 MG tablet TAKE ONE TABLET AT BEDTIME. 01/01/15   [name], MD  memantine (NAMENDA XR) 28 MG CP24 24 hr capsule Take 28 mg by mouth daily with breakfast.    Historical Provider, MD  metoprolol succinate (TOPROL-XL) 25 MG 24 hr tablet Take 25 mg by mouth daily with breakfast.    Historical Provider, MD  potassium chloride SA (K-DUR,KLOR-CON) 20 MEQ tablet Take 20 mEq by mouth daily with breakfast.    Historical Provider, MD  rosuvastatin (CRESTOR) 20 MG tablet Take 20 mg by mouth at bedtime.    Historical Provider, MD  saccharomyces boulardii (FLORASTOR) 250 MG capsule Take 1 capsule (250 mg total) by mouth 2 (two) times daily. 03/11/14   Marinda Elk, MD  traZODone (DESYREL) 50 MG tablet TAKE 1 OR 2 TABLETS AT BEDTIME FOR SLEEP. 01/02/15   [name], MD   BP 118/64 mmHg  Pulse 105  Temp(Src) 102.6 F (39.2 C) (Oral)  Resp 20  SpO2 95% Physical Exam  Constitutional: She is oriented to person, place, and time. She appears well-developed and well-nourished. No distress.  HENT:  Head: Normocephalic.  Eyes: Conjunctivae are normal.  Neck: Neck supple.  Cardiovascular: Normal rate, regular rhythm and normal heart sounds.   Pulmonary/Chest: Effort normal. No respiratory distress. She has wheezes. She has rales.   Rales bilaterally  Abdominal: Soft. Bowel sounds are normal. She exhibits no distension. There is no tenderness. There is no rebound.  Musculoskeletal: She exhibits no edema.  Neurological: She is alert and oriented to person, place, and time. No cranial nerve deficit. Coordination normal.  Strength 5/5 and equal bilaterally in upper and lower extremities  Skin: Skin is warm and dry.  Psychiatric: She has a normal mood and  affect. Her behavior is normal.  Nursing note and vitals reviewed.   ED Course  Procedures (including critical care time) Labs Review Labs Reviewed  COMPREHENSIVE METABOLIC PANEL - Abnormal; Notable for the following:    Potassium 3.3 (*)    Glucose, Bld 172 (*)    BUN 24 (*)    GFR calc non Af Amer 57 (*)    All other components within normal limits  URINALYSIS, ROUTINE W REFLEX MICROSCOPIC (NOT AT [facility]) - Abnormal; Notable for the following:    Glucose, UA 250 (*)    All other components within normal limits  I-STAT CG4 LACTIC ACID, ED - Abnormal; Notable for the following:    Lactic Acid, Venous 2.90 (*)    All other components within normal limits  CULTURE, BLOOD (ROUTINE X 2)  CULTURE, BLOOD (ROUTINE X 2)  CULTURE, EXPECTORATED SPUTUM-ASSESSMENT  GRAM STAIN  CBC WITH DIFFERENTIAL/PLATELET  INFLUENZA PANEL BY PCR (TYPE A & B, H1N1)  HIV ANTIBODY (ROUTINE TESTING)  I-STAT TROPOININ, ED  I-STAT CG4 LACTIC ACID, ED    Imaging Review Dg Chest 2 View  01/07/2015  CLINICAL DATA:  Weakness in the legs for 3 hours. Cough for 1 day. History of hypertension. EXAM: CHEST  2 VIEW COMPARISON:  01/08/2015 FINDINGS: Cardiac silhouette is normal in size. No mediastinal or hilar masses or convincing adenopathy. Mild linear scarring noted at the lung bases, stable. Lungs are otherwise clear. No pleural effusion or pneumothorax. Bony thorax is demineralized but grossly intact. IMPRESSION: No acute cardiopulmonary disease. Electronically Signed   By: Amie Portland M.D.   On: 01/07/2015 19:38   I have personally reviewed and evaluated these images and lab results as part of my medical decision-making.   EKG Interpretation None      MDM   Final diagnoses:  Hypoxia  Cough  Fever, unspecified fever cause  Wheezing  Weakness     patient hypoxic on room air, oxygen saturation in mid 80s. She is febrile 102.6. Mildly tachycardic. Blood pressure is normal.  Patient received one breathing  treatment for wheezing. We will get labs including blood cultures , lactic acid. Chest x-ray ordered. Patient also received Solu-Medrol by EMS. Patient does not appear to be in any respiratory distress at this time.    Patient's chest x-ray is negative, however given fever, hypoxia, cough, will cover for possible early pneumonia.  Rocephin and Zithromax ordered. We'll order another breathing treatment, patient continues to have wheezing. Patient's lactic acid slightly elevated, fluids ordered.    Spoke with admitting physician, will admit for hypoxia and possible pneumonia.  Filed Vitals:   01/07/15 2000 01/07/15 2023 01/07/15 2042 01/07/15 2226  BP: 132/56 132/56  128/54  Pulse:  92  79  Temp:  98.8 F (37.1 C) 98.8 F (37.1 C) 98.8 F (37.1 C)  TempSrc:  Oral Oral Oral  Resp:  18  18  Height:  5\' 1"  (1.549 m)  Weight:    72.4 kg  SpO2:  95%  100%       Jaynie Crumbleatyana Dejanique Ruehl, PA-C 01/08/15 0034  Leta BaptistEmily Roe Nguyen, MD 01/11/15 [id number]

## 2015-01-07 NOTE — ED Notes (Signed)
Pt back from x-ray.

## 2015-01-07 NOTE — H&P (Signed)
[facility] History and Physical  GEORGIAN MCCLORY ZOX:[id number] DOB: 04-01-1926 DOA: 01/07/2015  Referring physician: EDP PCP: [name], MD   Chief Complaint: Cough, generalized weakness   HPI: Bonnie Mora is a 81 y.o. female who presents to the ED with c/o cough, generalized weakness.  Cough onset yesterday, is dry and non-productive.  This afternoon she developed generalized weakness.  Patient had fever of over 102 on arrival to ED.  No flu shot this year.  No N/V, no recent sick contacts.  Review of Systems: Systems reviewed.  As above, otherwise negative  Past Medical History  Diagnosis Date  . Parkinson disease (HCC)   . Dementia   . Hyperlipidemia   . Hypertension    Past Surgical History  Procedure Laterality Date  . Joint replacement      bilateral knee  . Appendectomy    . Ovarian cyst surgery    . Tonsillectomy and adenoidectomy     Social History:  reports that she has never smoked. She does not have any smokeless tobacco history on file. She reports that she does not drink alcohol or use illicit drugs.  Allergies  Allergen Reactions  . Codeine Nausea And Vomiting    Family History  Problem Relation Age of Onset  . ALS Mother   . Heart disease Father   . Heart disease Maternal Grandfather   . Cancer Maternal Grandfather     breast     Prior to Admission medications   Medication Sig Start Date End Date Taking? Authorizing Provider  atorvastatin (LIPITOR) 20 MG tablet TAKE 1 TABLET AT BEDTIME FOR CHOLESTEROL. 01/01/15  Yes [name], MD  citalopram (CELEXA) 20 MG tablet TAKE 1 TABLET EACH DAY. 12/04/14  Yes [name], MD  donepezil (ARICEPT) 10 MG tablet TAKE ONE TABLET AT BEDTIME. 01/01/15  Yes [name], MD  memantine (NAMENDA XR) 28 MG CP24 24 hr capsule Take 28 mg by mouth daily with breakfast.   Yes Historical Provider, MD  metoprolol succinate (TOPROL-XL) 25 MG 24 hr tablet Take 25 mg by mouth daily  with breakfast.   Yes Historical Provider, MD  potassium chloride (MICRO-K) 10 MEQ CR capsule Take 10 mEq by mouth daily. 01/27/13  Yes Historical Provider, MD  traZODone (DESYREL) 50 MG tablet TAKE 1 OR 2 TABLETS AT BEDTIME FOR SLEEP. 01/02/15  Yes [name], MD  saccharomyces boulardii (FLORASTOR) 250 MG capsule Take 1 capsule (250 mg total) by mouth 2 (two) times daily. Patient not taking: Reported on 01/07/2015 03/11/14   Marinda Elk, MD   Physical Exam: Filed Vitals:   01/07/15 2023 01/07/15 2042  BP: 132/56   Pulse: 92   Temp: 98.8 F (37.1 C) 98.8 F (37.1 C)  Resp: 18     BP 132/56 mmHg  Pulse 92  Temp(Src) 98.8 F (37.1 C) (Oral)  Resp 18  SpO2 95%  General Appearance:    Alert, Demented, no distress, appears stated age  Head:    Normocephalic, atraumatic  Eyes:    PERRL, EOMI, sclera non-icteric        Nose:   Nares without drainage or epistaxis. Mucosa, turbinates normal  Throat:   Moist mucous membranes. Oropharynx without erythema or exudate.  Neck:   Supple. No carotid bruits.  No thyromegaly.  No lymphadenopathy.   Back:     No CVA tenderness, no spinal tenderness  Lungs:     Clear to auscultation bilaterally, without wheezes, rhonchi  or rales  Chest wall:    No tenderness to palpitation  Heart:    Regular rate and rhythm without murmurs, gallops, rubs  Abdomen:     Soft, non-tender, nondistended, normal bowel sounds, no organomegaly  Genitalia:    deferred  Rectal:    deferred  Extremities:   No clubbing, cyanosis or edema.  Pulses:   2+ and symmetric all extremities  Skin:   Skin color, texture, turgor normal, no rashes or lesions  Lymph nodes:   Cervical, supraclavicular, and axillary nodes normal  Neurologic:   CNII-XII intact. Normal strength, sensation and reflexes      throughout    Labs on Admission:  Basic Metabolic Panel:  Recent Labs Lab 01/07/15 1846  NA 140  K 3.3*  CL 101  CO2 25  GLUCOSE 172*  BUN 24*  CREATININE  0.89  CALCIUM 9.1   Liver Function Tests:  Recent Labs Lab 01/07/15 1846  AST 31  ALT 18  ALKPHOS 51  BILITOT 0.7  PROT 7.1  ALBUMIN 3.9   No results for input(s): LIPASE, AMYLASE in the last 168 hours. No results for input(s): AMMONIA in the last 168 hours. CBC:  Recent Labs Lab 01/07/15 1846  WBC 7.0  NEUTROABS 4.9  HGB 12.6  HCT 40.1  MCV 92.8  PLT 168   Cardiac Enzymes: No results for input(s): CKTOTAL, CKMB, CKMBINDEX, TROPONINI in the last 168 hours.  BNP (last 3 results) No results for input(s): PROBNP in the last 8760 hours. CBG: No results for input(s): GLUCAP in the last 168 hours.  Radiological Exams on Admission: Dg Chest 2 View  01/07/2015  CLINICAL DATA:  Weakness in the legs for 3 hours. Cough for 1 day. History of hypertension. EXAM: CHEST  2 VIEW COMPARISON:  01/08/2015 FINDINGS: Cardiac silhouette is normal in size. No mediastinal or hilar masses or convincing adenopathy. Mild linear scarring noted at the lung bases, stable. Lungs are otherwise clear. No pleural effusion or pneumothorax. Bony thorax is demineralized but grossly intact. IMPRESSION: No acute cardiopulmonary disease. Electronically Signed   By: Amie Portlandavid  Ormond M.D.   On: 01/07/2015 19:38    EKG: Independently reviewed.  Assessment/Plan Principal Problem:   CAP (community acquired pneumonia) Active Problems:   HLD (hyperlipidemia)   Essential hypertension   Hypoxia   1. Cough, fever, new O2 requirement - CAP vs bronchitis 1. PNA pathway 2. Rocephin / azithromycin 3. Adult wheeze protocol 4. Empiric tamiflu 5. Influenza PCR pending 2. HTN - continue home meds 3. HLD - continue home meds    Code Status: Full Code  Family Communication: Family at bedside Disposition Plan: Admit to inpatient   Time spent: 70 min  Hewitt Garner M. [facility] Pager [phone]  If 7AM-7PM, please contact the day team taking care of the patient [email or web address] Password  [facility] 01/07/2015, 8:56 PM

## 2015-01-08 LAB — BASIC METABOLIC PANEL
ANION GAP: 9 (ref 5–15)
BUN: 20 mg/dL (ref 6–20)
CO2: 26 mmol/L (ref 22–32)
Calcium: 9 mg/dL (ref 8.9–10.3)
Chloride: 108 mmol/L (ref 101–111)
Creatinine, Ser: 0.77 mg/dL (ref 0.44–1.00)
GFR calc Af Amer: >60 mL/min (ref >60)
GFR calc non Af Amer: >60 mL/min (ref >60)
GLUCOSE: 233 mg/dL — AB (ref 65–99)
POTASSIUM: 4.1 mmol/L (ref 3.5–5.1)
Sodium: 143 mmol/L (ref 135–145)

## 2015-01-08 LAB — INFLUENZA PANEL BY PCR (TYPE A & B)
H1N1FLUPCR: NOT DETECTED
INFLBPCR: NEGATIVE
Influenza A By PCR: POSITIVE — AB

## 2015-01-08 LAB — HIV ANTIBODY (ROUTINE TESTING W REFLEX): HIV Screen 4th Generation wRfx: NONREACTIVE

## 2015-01-08 LAB — MAGNESIUM: Magnesium: 2 mg/dL (ref 1.7–2.4)

## 2015-01-08 MED ORDER — BUDESONIDE 0.25 MG/2ML IN SUSP
0.2500 mg | Freq: Two times a day (BID) | RESPIRATORY_TRACT | Status: DC
  Administered 2015-01-08 – 2015-01-11 (×7): 0.25 mg via RESPIRATORY_TRACT
  Filled 2015-01-08 (×6): qty 2

## 2015-01-08 MED ORDER — BUDESONIDE 0.25 MG/2ML IN SUSP
RESPIRATORY_TRACT | Status: AC
  Filled 2015-01-08: qty 2

## 2015-01-08 MED ORDER — PNEUMOCOCCAL VAC POLYVALENT 25 MCG/0.5ML IJ INJ
0.5000 mL | INJECTION | INTRAMUSCULAR | Status: AC
  Administered 2015-01-10: 0.5 mL via INTRAMUSCULAR
  Filled 2015-01-08 (×2): qty 0.5

## 2015-01-08 MED ORDER — ALBUTEROL SULFATE (2.5 MG/3ML) 0.083% IN NEBU: 2.5000 mg | INHALATION_SOLUTION | Freq: Four times a day (QID) | RESPIRATORY_TRACT | Status: DC

## 2015-01-08 MED ORDER — PREDNISONE 20 MG PO TABS
40.0000 mg | ORAL_TABLET | Freq: Every day | ORAL | Status: DC
  Administered 2015-01-08 – 2015-01-10 (×3): 40 mg via ORAL
  Filled 2015-01-08 (×3): qty 2

## 2015-01-08 MED ORDER — IPRATROPIUM-ALBUTEROL 0.5-2.5 (3) MG/3ML IN SOLN
3.0000 mL | RESPIRATORY_TRACT | Status: DC
  Administered 2015-01-08 – 2015-01-09 (×9): 3 mL via RESPIRATORY_TRACT
  Filled 2015-01-08 (×9): qty 3

## 2015-01-08 MED ORDER — INFLUENZA VAC SPLIT QUAD 0.5 ML IM SUSY
0.5000 mL | PREFILLED_SYRINGE | INTRAMUSCULAR | Status: AC
  Administered 2015-01-10: 0.5 mL via INTRAMUSCULAR
  Filled 2015-01-08 (×2): qty 0.5

## 2015-01-08 NOTE — Progress Notes (Signed)
Pt had 21 beats of Vtach, asymptomatic. MD aware.

## 2015-01-08 NOTE — Progress Notes (Signed)
[facility]                                                                              Patient Demographics  Bonnie Mora, is a 81 y.o. female, DOB - 18-Mar-1926, [id number]  Admit date - 01/07/2015   Admitting Physician Hillary Bow, DO  Outpatient Primary MD for the patient is [name], MD  LOS - 1   Chief Complaint  Patient presents with  . Weakness  . Cough       Brief HPI   Bonnie Mora is a 81 y.o. female who presents to the ED with c/o cough, generalized weakness. Patient started coughing the day before admission, dry and nonproductive, subsequently developed generalized weakness and fever of 102 on arrival to ED. Patient had not received any flu shot this year. No N/V, no recent sick contacts.   Assessment & Plan    Principal Problem:   CAP (community acquired pneumonia) with acute hypoxic respiratory failure, acute bronchitis, influenza A positive - Currently wheezing diffusely, continue O2 via nasal cannula, placed on scheduled nebs, Pulmicort, prednisone - Continue IV Rocephin, Zithromax, Tamiflu - Follow sputum cultures, blood cultures   Active Problems:   HLD (hyperlipidemia) - Continue Lipitor    Essential hypertension - Continue Toprol-XL  Dementia Continue Aricept, Namenda, Celexa   Code Status: Full code  Family Communication: Discussed in detail with the patient, all imaging results, lab results explained to the patient   Disposition Plan:   Time Spent in minutes  25 minutes  Procedures  None   Consults   None   DVT Prophylaxis   SCD's  Medications  Scheduled Meds: . sodium chloride   Intravenous Once  . atorvastatin  20 mg Oral q1800  . azithromycin  500 mg Intravenous Q24H  . budesonide  0.25 mg Nebulization BID  . cefTRIAXone (ROCEPHIN)  IV  1 g Intravenous Q24H  . citalopram  20 mg Oral Daily  . donepezil  10 mg Oral QHS  . heparin  5,000 Units Subcutaneous 3 times per day  . [START  ON 01/09/2015] Influenza vac split quadrivalent PF  0.5 mL Intramuscular Tomorrow-1000  . ipratropium-albuterol  3 mL Nebulization Q4H  . memantine  28 mg Oral Q breakfast  . metoprolol succinate  25 mg Oral Q breakfast  . oseltamivir  30 mg Oral BID  . [START ON 01/09/2015] pneumococcal 23 valent vaccine  0.5 mL Intramuscular Tomorrow-1000  . potassium chloride  10 mEq Oral Daily  . predniSONE  40 mg Oral Q breakfast  . saccharomyces boulardii  250 mg Oral BID   Continuous Infusions: . sodium chloride 75 mL/hr (01/07/15 2257)   PRN Meds:.albuterol, ondansetron (ZOFRAN) IV, traZODone   Antibiotics   Anti-infectives    Start     Dose/Rate Route Frequency Ordered Stop   01/08/15 2100  azithromycin (ZITHROMAX) 500 mg in dextrose 5 % 250 mL IVPB     500 mg 250 mL/hr over 60 Minutes Intravenous Every 24 hours 01/07/15 2139 01/15/15 2059   01/08/15 2000  cefTRIAXone (ROCEPHIN) 1 g in dextrose 5 % 50 mL IVPB  1 g 100 mL/hr over 30 Minutes Intravenous Every 24 hours 01/07/15 2054 01/15/15 1959   01/07/15 2200  oseltamivir (TAMIFLU) capsule 30 mg     30 mg Oral 2 times daily 01/07/15 2053 01/12/15 2159   01/07/15 2000  azithromycin (ZITHROMAX) 500 mg in dextrose 5 % 250 mL IVPB  Status:  Discontinued     500 mg 250 mL/hr over 60 Minutes Intravenous Every 24 hours 01/07/15 2054 01/07/15 2139   01/07/15 1945  cefTRIAXone (ROCEPHIN) 1 g in dextrose 5 % 50 mL IVPB     1 g 100 mL/hr over 30 Minutes Intravenous  Once 01/07/15 1942 01/07/15 2141   01/07/15 1945  azithromycin (ZITHROMAX) 500 mg in dextrose 5 % 250 mL IVPB     500 mg 250 mL/hr over 60 Minutes Intravenous  Once 01/07/15 1942 01/07/15 2223        Subjective:   Bonnie Mora was seen and examined today.  Patient denies any chest pain however diffusely wheezing, no fevers or chills. No nausea, vomiting, abdominal pain, diarrhea. No new weakness, numbess, tingling. No acute events overnight.    Objective:   Blood pressure  167/76, pulse 80, temperature 97.8 F (36.6 C), temperature source Oral, resp. rate 18, height 5\' 1"  (1.549 m), weight 72.4 kg (159 lb 9.8 oz), SpO2 90 %.  Wt Readings from Last 3 Encounters:  01/07/15 72.4 kg (159 lb 9.8 oz)  10/30/14 73.029 kg (161 lb)     Intake/Output Summary (Last 24 hours) at 01/08/15 0842 Last data filed at 01/08/15 0300  Gross per 24 hour  Intake      0 ml  Output    700 ml  Net   -700 ml    Exam  General: Alert and oriented x 3, NAD  HEENT:  PERRLA, EOMI, Anicteric Sclera, mucous membranes moist.   Neck: Supple, no JVD, no masses  CVS: S1 S2 auscultated, no rubs, murmurs or gallops. Regular rate and rhythm.  Respiratory:  diffuse expiratory wheezing bilaterally  abdomen: Soft, nontender, nondistended, + bowel sounds  Ext: no cyanosis clubbing or edema  Neuro: AAOx3, Cr N's II- XII. Strength 5/5 upper and lower extremities bilaterally  Skin: No rashes  Psych: Normal affect and demeanor, alert and oriented x3    Data Review   Micro Results No results found for this or any previous visit (from the past 240 hour(s)).  Radiology Reports Dg Chest 2 View  01/07/2015  CLINICAL DATA:  Weakness in the legs for 3 hours. Cough for 1 day. History of hypertension. EXAM: CHEST  2 VIEW COMPARISON:  01/08/2015 FINDINGS: Cardiac silhouette is normal in size. No mediastinal or hilar masses or convincing adenopathy. Mild linear scarring noted at the lung bases, stable. Lungs are otherwise clear. No pleural effusion or pneumothorax. Bony thorax is demineralized but grossly intact. IMPRESSION: No acute cardiopulmonary disease. Electronically Signed   By: Amie Portlandavid  Ormond M.D.   On: 01/07/2015 19:38    CBC  Recent Labs Lab 01/07/15 1846  WBC 7.0  HGB 12.6  HCT 40.1  PLT 168  MCV 92.8  MCH 29.2  MCHC 31.4  RDW 14.2  LYMPHSABS 1.7  MONOABS 0.4  EOSABS 0.0  BASOSABS 0.0    Chemistries   Recent Labs Lab 01/07/15 1846  NA 140  K 3.3*  CL 101    CO2 25  GLUCOSE 172*  BUN 24*  CREATININE 0.89  CALCIUM 9.1  AST 31  ALT 18  ALKPHOS 51  BILITOT  0.7   ------------------------------------------------------------------------------------------------------------------ estimated creatinine clearance is 41.3 mL/min (by C-G formula based on Cr of 0.89). ------------------------------------------------------------------------------------------------------------------ No results for input(s): HGBA1C in the last 72 hours. ------------------------------------------------------------------------------------------------------------------ No results for input(s): CHOL, HDL, LDLCALC, TRIG, CHOLHDL, LDLDIRECT in the last 72 hours. ------------------------------------------------------------------------------------------------------------------ No results for input(s): TSH, T4TOTAL, T3FREE, THYROIDAB in the last 72 hours.  Invalid input(s): FREET3 ------------------------------------------------------------------------------------------------------------------ No results for input(s): VITAMINB12, FOLATE, FERRITIN, TIBC, IRON, RETICCTPCT in the last 72 hours.  Coagulation profile No results for input(s): INR, PROTIME in the last 168 hours.  No results for input(s): DDIMER in the last 72 hours.  Cardiac Enzymes No results for input(s): CKMB, TROPONINI, MYOGLOBIN in the last 168 hours.  Invalid input(s): CK ------------------------------------------------------------------------------------------------------------------ Invalid input(s): POCBNP  No results for input(s): GLUCAP in the last 72 hours.   Roniyah Llorens M.D. [facility] 01/08/2015, 8:42 AM  Pager: [phone] Between 7am to 7pm - call Pager - [phone]  After 7pm go to [email or web address] - password [email or web address]  Call night coverage person covering after 7pm

## 2015-01-08 NOTE — Evaluation (Addendum)
Physical Therapy Evaluation Patient Details Name: Eddie NorthDorothy J Murillo MRN: [id number] DOB: 1926/06/07 Today's Date: 01/08/2015   History of Present Illness  81 yo female admitted with Pna, +flu. Hx of dementia, Parkinson's, HTN  Clinical Impression  On eval, pt required Min assist for mobility-walked ~125 feet with RW. LOB multiple times during session, especially without assistance and use of RW for support. Pt is at significant risk for falls-very unsteady and tremerous. At this time, [facility] Pt states she has a friend that lives with her but unsure if she can provide current level of assist. If pt discharges home, recommend HHPT,home health aide, RW use.    Follow Up Recommendations SNF;Supervision/Assistance - 24 hour (unless mobility improves significantly )    Equipment Recommendations  Rolling walker with 5" wheels (if pt doesnt have one already)    Recommendations for Other Services       Precautions / Restrictions Precautions Precautions: Fall Restrictions Weight Bearing Restrictions: No      Mobility  Bed Mobility               General bed mobility comments: pt sitting in recliner  Transfers Overall transfer level: Needs assistance   Transfers: Sit to/from Stand Sit to Stand: Min assist         General transfer comment: x3. LOB posterioly and sat abruptly into chair x2. On last attempt, therapist provided assistance and RW placed in front of pt for support once standing.   Ambulation/Gait Ambulation/Gait assistance: Min assist Ambulation Distance (Feet): 125 Feet Assistive device: Rolling walker (2 wheeled) Gait Pattern/deviations: Step-through pattern;Decreased stride length     General Gait Details: Very unsteady, shaky. bil LE instability noted. Pt tolerated distance well. Pt is at risk for falls.   Stairs            Wheelchair Mobility    Modified Rankin (Stroke Patients Only)       Balance Overall balance  assessment: Needs assistance         Standing balance support: Bilateral upper extremity supported;During functional activity Standing balance-Leahy Scale: Poor                               Pertinent Vitals/Pain Pain Assessment: No/denies pain    Home Living Family/patient expects to be discharged to:: Private residence Living Arrangements: Non-relatives/Friends   Type of Home: House Home Access: Stairs to enter Entrance Stairs-Rails: None Secretary/administratorntrance Stairs-Number of Steps: 2 Home Layout: One level Home Equipment: Environmental consultantWalker - 2 wheels      Prior Function Level of Independence: Independent               Hand Dominance        Extremity/Trunk Assessment   Upper Extremity Assessment: Generalized weakness           Lower Extremity Assessment: Generalized weakness      Cervical / Trunk Assessment: Normal  Communication   Communication: HOH  Cognition Arousal/Alertness: Awake/alert Behavior During Therapy: WFL for tasks assessed/performed Overall Cognitive Status: History of cognitive impairments - at baseline                      General Comments      Exercises        Assessment/Plan    PT Assessment Patient needs continued PT services  PT Diagnosis Difficulty walking;Abnormality of gait;Generalized weakness   PT Problem List Decreased strength;Decreased  activity tolerance;Decreased balance;Decreased mobility;Obesity;Decreased knowledge of use of DME  PT Treatment Interventions DME instruction;Gait training;Functional mobility training;Therapeutic activities;Patient/family education;Balance training;Therapeutic exercise   PT Goals (Current goals can be found in the Care Plan section) Acute Rehab PT Goals Patient Stated Goal: home  PT Goal Formulation: With patient Time For Goal Achievement: 01/22/15 Potential to Achieve Goals: Good    Frequency Min 3X/week   Barriers to discharge        Co-evaluation                End of Session Equipment Utilized During Treatment: Gait belt Activity Tolerance: Patient tolerated treatment well Patient left: in chair;with call bell/phone within reach;with chair alarm set           Time: 9147-8295 PT Time Calculation (min) (ACUTE ONLY): 15 min   Charges:   PT Evaluation $Initial PT Evaluation Tier I: 1 Procedure     PT G Codes:        Rebeca Alert, MPT Pager: [phone]

## 2015-01-09 LAB — CBC
HEMATOCRIT: 35.1 % — AB (ref 36.0–46.0)
Hemoglobin: 11.3 g/dL — ABNORMAL LOW (ref 12.0–15.0)
MCH: 30 pg (ref 26.0–34.0)
MCHC: 32.2 g/dL (ref 30.0–36.0)
MCV: 93.1 fL (ref 78.0–100.0)
PLATELETS: 147 10*3/uL — AB (ref 150–400)
RBC: 3.77 MIL/uL — ABNORMAL LOW (ref 3.87–5.11)
RDW: 14.5 % (ref 11.5–15.5)
WBC: 14.4 10*3/uL — AB (ref 4.0–10.5)

## 2015-01-09 LAB — BASIC METABOLIC PANEL
Anion gap: 9 (ref 5–15)
BUN: 24 mg/dL — AB (ref 6–20)
CALCIUM: 8.8 mg/dL — AB (ref 8.9–10.3)
CHLORIDE: 111 mmol/L (ref 101–111)
CO2: 22 mmol/L (ref 22–32)
CREATININE: 0.95 mg/dL (ref 0.44–1.00)
GFR calc Af Amer: >60 mL/min (ref >60)
GFR calc non Af Amer: 53 mL/min — ABNORMAL LOW (ref >60)
GLUCOSE: 133 mg/dL — AB (ref 65–99)
Potassium: 3.8 mmol/L (ref 3.5–5.1)
Sodium: 142 mmol/L (ref 135–145)

## 2015-01-09 MED ORDER — LORAZEPAM 2 MG/ML IJ SOLN: 1.0000 mg | Freq: Once | INTRAMUSCULAR | Status: DC

## 2015-01-09 MED ORDER — AZITHROMYCIN 250 MG PO TABS
500.0000 mg | ORAL_TABLET | Freq: Every day | ORAL | Status: DC
  Administered 2015-01-09: 500 mg via ORAL
  Filled 2015-01-09: qty 2

## 2015-01-09 MED ORDER — IPRATROPIUM-ALBUTEROL 0.5-2.5 (3) MG/3ML IN SOLN
3.0000 mL | Freq: Three times a day (TID) | RESPIRATORY_TRACT | Status: DC
  Administered 2015-01-10 – 2015-01-11 (×4): 3 mL via RESPIRATORY_TRACT
  Filled 2015-01-09 (×4): qty 3

## 2015-01-09 MED ORDER — LORAZEPAM 2 MG/ML IJ SOLN
0.5000 mg | Freq: Once | INTRAMUSCULAR | Status: AC
  Administered 2015-01-09: 0.5 mg via INTRAVENOUS
  Filled 2015-01-09: qty 1

## 2015-01-09 NOTE — Progress Notes (Signed)
Spoke with pt's son who states that he is taking pt home and has two sitters for his mother.

## 2015-01-09 NOTE — Progress Notes (Signed)
PHARMACIST - PHYSICIAN COMMUNICATION DR:   Isidoro Donningai CONCERNING: Antibiotic IV to Oral Route Change Policy  RECOMMENDATION: This patient is receiving Azithromycin by the intravenous route.  Based on criteria approved by the Pharmacy and Therapeutics Committee, the antibiotic(s) is/are being converted to the equivalent oral dose form(s).   DESCRIPTION: These criteria include:  Patient being treated for a respiratory tract infection, urinary tract infection, cellulitis or clostridium difficile associated diarrhea if on metronidazole  The patient is not neutropenic and does not exhibit a GI malabsorption state  The patient is eating (either orally or via tube) and/or has been taking other orally administered medications for a least 24 hours  The patient is improving clinically and has a Tmax < 100.5  If you have questions about this conversion, please contact the Pharmacy Department  []   [phone]( [phone] )  [facility]  Lucienne Sawyers PharmD, [place] Pager [phone] 01/09/2015 10:41 AM

## 2015-01-09 NOTE — Progress Notes (Signed)
CSW received referral for New SNF.   CSW attempted x 2 to meet with pt son in room, but pt son not present. CSW left message for pt son on phone number listed on whiteboard in pt room.   Per RNCM, RNCM spoke with pt son who [facility] that pt son planned on taking pt home and has two private caregivers for pt at home.   CSW to continue to follow to assist if plan changes.   Loletta SpecterSuzanna Kidd, MSW, LCSW Clinical Social Work [phone]

## 2015-01-09 NOTE — Progress Notes (Signed)
SATURATION QUALIFICATIONS: (This note is used to comply with regulatory documentation for home oxygen)  Patient Saturations on Room Air at Rest = 94%  Patient Saturations on Room Air while Ambulating = 87%  Patient Saturations on 2Liters of oxygen while Ambulating = 93%  Please briefly explain why patient needs home oxygen:pt de sats when ambulating

## 2015-01-09 NOTE — Progress Notes (Signed)
Pt. Gait very unsteady, took 2 people to walk and with aid of walker.

## 2015-01-09 NOTE — Progress Notes (Signed)
[facility]                                                                              Patient Demographics  Bonnie Mora, is a 81 y.o. female, DOB - 07/04/1926, WUJ:[id number]  Admit date - 01/07/2015   Admitting Physician Hillary Bow, DO  Outpatient Primary MD for the patient is [name], MD  LOS - 2   Chief Complaint  Patient presents with  . Weakness  . Cough       Brief HPI   Bonnie Mora is a 81 y.o. female who presents to the ED with c/o cough, generalized weakness. Patient started coughing the day before admission, dry and nonproductive, subsequently developed generalized weakness and fever of 102 on arrival to ED. Patient had not received any flu shot this year. No N/V, no recent sick contacts.   Assessment & Plan    Principal Problem:   CAP (community acquired pneumonia) with acute hypoxic respiratory failure, acute bronchitis, influenza A positive - continue O2 via nasal cannula, placed on scheduled nebs, Pulmicort, prednisone - Continue IV Rocephin, Zithromax, Tamiflu (day #2) - Follow sputum cultures, blood cultures   Active Problems:   HLD (hyperlipidemia) - Continue Lipitor    Essential hypertension - Continue Toprol-XL  Dementia Continue Aricept, Namenda, Celexa   Code Status: Full code  Family Communication: Discussed in detail with the patient, all imaging results, lab results explained to the patient and patient's son at the bedside   Disposition Plan:   Time Spent in minutes  15 minutes  Procedures  None   Consults   None   DVT Prophylaxis   SCD's  Medications  Scheduled Meds: . atorvastatin  20 mg Oral q1800  . azithromycin  500 mg Oral QHS  . budesonide  0.25 mg Nebulization BID  . cefTRIAXone (ROCEPHIN)  IV  1 g Intravenous Q24H  . citalopram  20 mg Oral Daily  . donepezil  10 mg Oral QHS  . heparin  5,000 Units Subcutaneous 3 times per day  . Influenza vac split quadrivalent PF  0.5  mL Intramuscular Tomorrow-1000  . ipratropium-albuterol  3 mL Nebulization Q4H  . memantine  28 mg Oral Q breakfast  . metoprolol succinate  25 mg Oral Q breakfast  . oseltamivir  30 mg Oral BID  . pneumococcal 23 valent vaccine  0.5 mL Intramuscular Tomorrow-1000  . potassium chloride  10 mEq Oral Daily  . predniSONE  40 mg Oral Q breakfast  . saccharomyces boulardii  250 mg Oral BID   Continuous Infusions:   PRN Meds:.albuterol, traZODone   Antibiotics   Anti-infectives    Start     Dose/Rate Route Frequency Ordered Stop   01/09/15 2200  azithromycin (ZITHROMAX) tablet 500 mg     500 mg Oral Daily at bedtime 01/09/15 1042     01/08/15 2100  azithromycin (ZITHROMAX) 500 mg in dextrose 5 % 250 mL IVPB  Status:  Discontinued     500 mg 250 mL/hr over 60 Minutes Intravenous Every 24 hours 01/07/15 2139 01/09/15 1041   01/08/15 2000  cefTRIAXone (ROCEPHIN) 1 g in  dextrose 5 % 50 mL IVPB     1 g 100 mL/hr over 30 Minutes Intravenous Every 24 hours 01/07/15 2054 01/15/15 1959   01/07/15 2200  oseltamivir (TAMIFLU) capsule 30 mg     30 mg Oral 2 times daily 01/07/15 2053 01/12/15 2159   01/07/15 2000  azithromycin (ZITHROMAX) 500 mg in dextrose 5 % 250 mL IVPB  Status:  Discontinued     500 mg 250 mL/hr over 60 Minutes Intravenous Every 24 hours 01/07/15 2054 01/07/15 2139   01/07/15 1945  cefTRIAXone (ROCEPHIN) 1 g in dextrose 5 % 50 mL IVPB     1 g 100 mL/hr over 30 Minutes Intravenous  Once 01/07/15 1942 01/07/15 2141   01/07/15 1945  azithromycin (ZITHROMAX) 500 mg in dextrose 5 % 250 mL IVPB     500 mg 250 mL/hr over 60 Minutes Intravenous  Once 01/07/15 1942 01/07/15 2223        Subjective:   Bonnie Mora was seen and examined today. Still wheezing, appetite improving, no fevers or chills.  No nausea, vomiting, abdominal pain, diarrhea. No new weakness, numbess, tingling. No acute events overnight.    Objective:   Blood pressure 122/58, pulse 79, temperature 97.9 F  (36.6 C), temperature source Axillary, resp. rate 20, height  (1.549 m), weight 72.4 kg (159 lb 9.8 oz), SpO2 95 %.  Wt Readings from Last 3 Encounters:  01/07/15 72.4 kg (159 lb 9.8 oz)  10/30/14 73.029 kg (161 lb)     Intake/Output Summary (Last 24 hours) at 01/09/15 1253 Last data filed at 01/09/15 0831  Gross per 24 hour  Intake 2748.75 ml  Output    400 ml  Net 2348.75 ml    Exam  General: Alert and oriented x 3, NAD  HEENT:  PERRLA, EOMI, Anicteric Sclera, mucous membranes moist.   Neck: Supple, no JVD, no masses  CVS: S1 S2 auscultated, no rubs, murmurs or gallops. Regular rate and rhythm.  Respiratory:  Bilateral rhonchi with expiratory wheezing  abdomen: Soft, nontender, nondistended, + bowel sounds  Ext: no cyanosis clubbing or edema  Neuro: no new deficits  Skin: No rashes  Psych: Normal affect and demeanor, alert and oriented x3    Data Review   Micro Results No results found for this or any previous visit (from the past 240 hour(s)).  Radiology Reports Dg Chest 2 View  01/07/2015  CLINICAL DATA:  Weakness in the legs for 3 hours. Cough for 1 day. History of hypertension. EXAM: CHEST  2 VIEW COMPARISON:  01/08/2015 FINDINGS: Cardiac silhouette is normal in size. No mediastinal or hilar masses or convincing adenopathy. Mild linear scarring noted at the lung bases, stable. Lungs are otherwise clear. No pleural effusion or pneumothorax. Bony thorax is demineralized but grossly intact. IMPRESSION: No acute cardiopulmonary disease. Electronically Signed   By: Amie Portland M.D.   On: 01/07/2015 19:38    CBC  Recent Labs Lab 01/07/15 1846 01/09/15 0505  WBC 7.0 14.4*  HGB 12.6 11.3*  HCT 40.1 35.1*  PLT 168 147*  MCV 92.8 93.1  MCH 29.2 30.0  MCHC 31.4 32.2  RDW 14.2 14.5  LYMPHSABS 1.7  --   MONOABS 0.4  --   EOSABS 0.0  --   BASOSABS 0.0  --     Chemistries   Recent Labs Lab 01/07/15 1846 01/08/15 0920 01/09/15 0505  NA 140  143 142  K 3.3* 4.1 3.8  CL 101 108 111  CO2 25 26  22  GLUCOSE 172* 233* 133*  BUN 24* 20 24*  CREATININE 0.89 0.77 0.95  CALCIUM 9.1 9.0 8.8*  MG  --  2.0  --   AST 31  --   --   ALT 18  --   --   ALKPHOS 51  --   --   BILITOT 0.7  --   --    ------------------------------------------------------------------------------------------------------------------ estimated creatinine clearance is 38.7 mL/min (by C-G formula based on Cr of 0.95). ------------------------------------------------------------------------------------------------------------------ No results for input(s): HGBA1C in the last 72 hours. ------------------------------------------------------------------------------------------------------------------ No results for input(s): CHOL, HDL, LDLCALC, TRIG, CHOLHDL, LDLDIRECT in the last 72 hours. ------------------------------------------------------------------------------------------------------------------ No results for input(s): TSH, T4TOTAL, T3FREE, THYROIDAB in the last 72 hours.  Invalid input(s): FREET3 ------------------------------------------------------------------------------------------------------------------ No results for input(s): VITAMINB12, FOLATE, FERRITIN, TIBC, IRON, RETICCTPCT in the last 72 hours.  Coagulation profile No results for input(s): INR, PROTIME in the last 168 hours.  No results for input(s): DDIMER in the last 72 hours.  Cardiac Enzymes No results for input(s): CKMB, TROPONINI, MYOGLOBIN in the last 168 hours.  Invalid input(s): CK ------------------------------------------------------------------------------------------------------------------ Invalid input(s): POCBNP  No results for input(s): GLUCAP in the last 72 hours.   RAI,RIPUDEEP M.D. [facility] 01/09/2015, 12:53 PM  Pager: [phone] Between 7am to 7pm - call Pager - [phone]  After 7pm go to [email or web address] - password [email or web address]  Call night coverage person  covering after 7pm

## 2015-01-10 DIAGNOSIS — J101 Influenza due to other identified influenza virus with other respiratory manifestations: Secondary | ICD-10-CM | POA: Diagnosis not present

## 2015-01-10 LAB — BASIC METABOLIC PANEL
ANION GAP: 9 (ref 5–15)
BUN: 23 mg/dL — ABNORMAL HIGH (ref 6–20)
CHLORIDE: 107 mmol/L (ref 101–111)
CO2: 27 mmol/L (ref 22–32)
Calcium: 9.1 mg/dL (ref 8.9–10.3)
Creatinine, Ser: 0.88 mg/dL (ref 0.44–1.00)
GFR, EST NON AFRICAN AMERICAN: 58 mL/min — AB (ref >60)
Glucose, Bld: 163 mg/dL — ABNORMAL HIGH (ref 65–99)
POTASSIUM: 4.1 mmol/L (ref 3.5–5.1)
SODIUM: 143 mmol/L (ref 135–145)

## 2015-01-10 LAB — CBC
HCT: 35.7 % — ABNORMAL LOW (ref 36.0–46.0)
HEMOGLOBIN: 11.3 g/dL — AB (ref 12.0–15.0)
MCH: 29.5 pg (ref 26.0–34.0)
MCHC: 31.7 g/dL (ref 30.0–36.0)
MCV: 93.2 fL (ref 78.0–100.0)
PLATELETS: 180 10*3/uL (ref 150–400)
RBC: 3.83 MIL/uL — AB (ref 3.87–5.11)
RDW: 14.6 % (ref 11.5–15.5)
WBC: 11.4 10*3/uL — AB (ref 4.0–10.5)

## 2015-01-10 MED ORDER — PREDNISONE 20 MG PO TABS
20.0000 mg | ORAL_TABLET | Freq: Every day | ORAL | Status: DC
  Administered 2015-01-11: 20 mg via ORAL
  Filled 2015-01-10: qty 1

## 2015-01-10 NOTE — Progress Notes (Signed)
Physical Therapy Treatment Patient Details Name: Bonnie NorthDorothy J Sells MRN: [id number] DOB: [birth year] Today's Date: 01/10/2015    History of Present Illness 81 yo female admitted with Pna, +flu. Hx of dementia, Parkinson's, HTN    PT Comments    [facility] then amb in hallway.  Typical Parkinson's gait.  Required assist of a RW.  Attempted amb without as prior pt stated she walked with nothing.  Too unsteady.  Back on walker and advised pt to use until she gets stronger.    Follow Up Recommendations  Home health PT;Supervision/Assistance - 24 hour (son arranging extra help)     Equipment Recommendations  None recommended by PT (has a walker)    Recommendations for Other Services       Precautions / Restrictions Precautions Precautions: Fall Precaution Comments: Parkinsons Restrictions Weight Bearing Restrictions: No    Mobility  Bed Mobility Overal bed mobility: Needs Assistance Bed Mobility: Supine to Sit     Supine to sit: Min assist     General bed mobility comments: increased esp to scoot to EOB due to tremors  Transfers Overall transfer level: Needs assistance Equipment used: Rolling walker (2 wheeled) Transfers: Sit to/from Stand Sit to Stand: Min assist;Min guard         General transfer comment: 25% VC's on safety esp with turn completion prior to sit  Ambulation/Gait Ambulation/Gait assistance: Min assist;Mod assist;+2 safety/equipment Ambulation Distance (Feet): 115 Feet Assistive device: Rolling walker (2 wheeled) Gait Pattern/deviations: Step-to pattern;Step-through pattern;Festinating Gait velocity: WFL   General Gait Details: + 2 for safety such that recliner was following.  Mild tremors throughout.  Typical Parkinsons gait.  Slightly unsteady.  Amb with walker and without.  Too unsteady without so advised to use walker for now.  Pt agreed.    Stairs            Wheelchair Mobility    Modified Rankin (Stroke Patients  Only)       Balance                                    Cognition Arousal/Alertness: Awake/alert Behavior During Therapy: WFL for tasks assessed/performed Overall Cognitive Status: History of cognitive impairments - at baseline                      Exercises      General Comments        Pertinent Vitals/Pain Pain Assessment: No/denies pain    Home Living                      Prior Function            PT Goals (current goals can now be found in the care plan section) Progress towards PT goals: Progressing toward goals    Frequency  Min 3X/week    PT Plan Current plan remains appropriate    Co-evaluation             End of Session Equipment Utilized During Treatment: Gait belt Activity Tolerance: Patient tolerated treatment well Patient left: in chair;with call bell/phone within reach;with chair alarm set     Time: 1145-1210 PT Time Calculation (min) (ACUTE ONLY): 25 min  Charges:  $Gait Training: 8-22 mins $Therapeutic Activity: 8-22 mins  G Codes:      Rica Koyanagi  PTA [facility]  Acute  Rehab Pager      [phone]

## 2015-01-10 NOTE — Progress Notes (Signed)
CSW spoke with pt son today. Pt son reports that pt has two caregivers that can assist with pt care at home, but pt son would be open to rehab if recommended today. PT was working with pt and PT stated that pt would be safe to discharge home with 24 hour care and home health services. Pt son notified and agreeable to plan for discharge home with caregivers and home health. RNCM aware.   No further social work needs identified.   CSW signing off.   Please re-consult if social work needs arise.  Loletta SpecterSuzanna Eddie Koc, MSW, LCSW Clinical Social Work [phone]

## 2015-01-10 NOTE — Progress Notes (Signed)
Eddie NorthDorothy J Aki [id number] DOB: [birth year] DOA: 01/07/2015 PCP: Myrlene BrokerElizabeth A Crawford, MD  Brief narrative: 81 y/o ? Mild dementia Hyperlipidemia Hypertension Benign tremors End-stage osteoarthritis left knee status post surgery 2008  Admitted to hospital 01/07/15 with a cough generalized weakness and fever 102 found to be positive for flu  Past medical history-As per Problem list Chart reviewed as below-   Consultants:  none  Procedures:  none  Antibiotics:  non   Subjective    Fair feels better No wheeze Eating fair.  No cp   Objective    Interim History:   Telemetry:    Objective: Filed Vitals:   01/09/15 1957 01/09/15 2005 01/10/15 0441 01/10/15 0753  BP: 137/70  161/95   Pulse: 77 81 75   Temp: 98 F (36.7 C)  98 F (36.7 C)   TempSrc: Oral  Oral   Resp: 18 16    Height:      Weight:      SpO2: 98% 98% 98% 96%    Intake/Output Summary (Last 24 hours) at 01/10/15 1210 Last data filed at 01/10/15 1058  Gross per 24 hour  Intake     50 ml  Output    450 ml  Net   -400 ml    Exam:  General: alert, pleasantly confused.  No ict no pallor Cardiovascular: s1 s2 no m/r/g Respiratory: no wheeze Abdomen:  Soft nt nd no distended Skin no le edema Neuro intact but confused  Data Reviewed: Basic Metabolic Panel:  Recent Labs Lab 01/07/15 1846 01/08/15 0920 01/09/15 0505 01/10/15 0500  NA 140 143 142 143  K 3.3* 4.1 3.8 4.1  CL 101 108 111 107  CO2 25 26 22 27   GLUCOSE 172* 233* 133* 163*  BUN 24* 20 24* 23*  CREATININE 0.89 0.77 0.95 0.88  CALCIUM 9.1 9.0 8.8* 9.1  MG  --  2.0  --   --    Liver Function Tests:  Recent Labs Lab 01/07/15 1846  AST 31  ALT 18  ALKPHOS 51  BILITOT 0.7  PROT 7.1  ALBUMIN 3.9   No results for input(s): LIPASE, AMYLASE in the last 168 hours. No results for input(s): AMMONIA in the last 168 hours. CBC:  Recent Labs Lab 01/07/15 1846 01/09/15 0505 01/10/15 0500  WBC 7.0 14.4* 11.4*    NEUTROABS 4.9  --   --   HGB 12.6 11.3* 11.3*  HCT 40.1 35.1* 35.7*  MCV 92.8 93.1 93.2  PLT 168 147* 180   Cardiac Enzymes: No results for input(s): CKTOTAL, CKMB, CKMBINDEX, TROPONINI in the last 168 hours. BNP: Invalid input(s): POCBNP CBG: No results for input(s): GLUCAP in the last 168 hours.  Recent Results (from the past 240 hour(s))  Blood culture (routine x 2)     Status: None (Preliminary result)   Collection Time: 01/07/15  6:44 PM  Result Value Ref Range Status   Specimen Description BLOOD RIGHT ANTECUBITAL  Final   Special Requests BOTTLES DRAWN AEROBIC AND ANAEROBIC 4CC  Final   Culture   Final    NO GROWTH 2 DAYS [facility]    Report Status PENDING  Incomplete  Blood culture (routine x 2)     Status: None (Preliminary result)   Collection Time: 01/07/15  6:56 PM  Result Value Ref Range Status   Specimen Description BLOOD BLOOD RIGHT FOREARM  Final   Special Requests BOTTLES DRAWN AEROBIC AND ANAEROBIC 5CC  Final   Culture  Final    NO GROWTH 2 DAYS Performed at [facility]    Report Status PENDING  Incomplete     Studies:              All Imaging reviewed and is as per above notation   Scheduled Meds: . atorvastatin  20 mg Oral q1800  . budesonide  0.25 mg Nebulization BID  . citalopram  20 mg Oral Daily  . donepezil  10 mg Oral QHS  . heparin  5,000 Units Subcutaneous 3 times per day  . Influenza vac split quadrivalent PF  0.5 mL Intramuscular Tomorrow-1000  . ipratropium-albuterol  3 mL Nebulization TID  . memantine  28 mg Oral Q breakfast  . metoprolol succinate  25 mg Oral Q breakfast  . oseltamivir  30 mg Oral BID  . pneumococcal 23 valent vaccine  0.5 mL Intramuscular Tomorrow-1000  . potassium chloride  10 mEq Oral Daily  . predniSONE  40 mg Oral Q breakfast  . saccharomyces boulardii  250 mg Oral BID  1.    1. Viral Bronchitis with initial concenr for PNA-d/c Azithro and Rocephin. Continue Tamiflu for 2  more days.  Cautioned patient relative re: precautions. Monitor overnight with fever curve and CBC + diff in am.  Continue prednisone at lower dose 20 mg today and then stop,  Nebs prn 2. Htn-continue Metoprolol 25 q am.  Continue this also for benign tremor 3. HLD-continue Lipitor 20 daily 4. Depression and dementia-Donepezil 10 qhs, Citalopram 20 daily, Memantine 28 q am   Code Status: full Family Communication:  D/w sone bedside Disposition Plan:  inpt  Appt with PCP: Requested Code Status: Full code/DNR Family Communication: family + Disposition Plan: home/snf DVT prophylaxis: SCD Consultants:   Pleas Koch, MD  [facility] Pager [phone] 01/10/2015, 12:10 PM    LOS: 3 days

## 2015-01-10 NOTE — Care Management Important Message (Signed)
Important Message  Patient Details IM Letter given to Cookie/Case Manager to present to PatientImportant Message  Patient Details  Name: Bonnie NorthDorothy J Mora MRN: [id number] Date of Birth: [birth year]   Medicare Important Message Given:  Yes    Haskell FlirtJamison, Yaviel Kloster 01/10/2015, 11:25 AM Name: Bonnie NorthDorothy J Mora MRN: [id number] Date of Birth: [birth year]   Medicare Important Message Given:  Yes    Haskell FlirtJamison, Navada Osterhout 01/10/2015, 11:23 AM

## 2015-01-11 MED ORDER — OSELTAMIVIR PHOSPHATE 30 MG PO CAPS: 30.0000 mg | ORAL_CAPSULE | Freq: Two times a day (BID) | ORAL | Status: DC

## 2015-01-11 NOTE — Progress Notes (Signed)
Spoke with pt's son at bedside concerning HHPT. Son states, "Pt has two PCS and she is walking like she did before there are no changes.  No, HHPT at present time."

## 2015-01-11 NOTE — Discharge Summary (Signed)
Physician Discharge Summary  Bonnie Mora XBJ:[id number] DOB: 1926-06-16 DOA: 01/07/2015  PCP: Bonnie Broker, MD  Admit date: 01/07/2015 Discharge date: 01/11/2015  Time spent: 25 minutes  Recommendations for Outpatient Follow-up:  1. Needs OP PT/OT to follow up  2. consider OP CXR in 1 months   Discharge Diagnoses:  Principal Problem:   CAP (community acquired pneumonia) Active Problems:   HLD (hyperlipidemia)   Essential hypertension   Hypoxia   Discharge Condition: improved  Diet recommendation: heart healthy low salt  Filed Weights   01/07/15 2226  Weight: 72.4 kg (159 lb 9.8 oz)   81 y/o ? Mild dementia Hyperlipidemia Hypertension Benign tremors End-stage osteoarthritis left knee status post surgery 2008  Admitted to hospital 01/07/15 with a cough generalized weakness and fever 102 found to be positive for flu   Hospital Course:  1. Viral Bronchitis with initial concern for PNA-d/c Azithro and Rocephin. Continue Tamiflu for 2 more days. Cautioned patient relative re: precautions. Monitor overnight with fever curve and CBC + diff in am. Continue prednisone at lower dose 20 mg today and then stop, Nebs prn but not given on d/c as no wheeze and breathing fine off of oxygen 2. Htn-continue Metoprolol 25 q am. Continue this also for benign tremor 3. HLD-continue Lipitor 20 daily 4. Depression and dementia-Donepezil 10 qhs, Citalopram 20 daily, Memantine 28 q am   Discharge Exam: Filed Vitals:   01/10/15 2030 01/11/15 0653  BP: 142/68 143/65  Pulse: 65 67  Temp: 98.2 F (36.8 C) 98.1 F (36.7 C)  Resp: 18 18    General: eomi ncat no distress lookswell no fever overnight Cardiovascular: s1 s 2no m/r/g Respiratory: clear with mild scattered wheeze  Discharge Instructions   Discharge Instructions    Diet - low sodium heart healthy    Complete by:  As directed      Discharge instructions    Complete by:  As directed   Complete course of  Tamiflu for the flu Seek medical care if you feel poorly or have a fever above 102.5 Get labwork done at your PCP office and visit your Primary MD in about 2 weeks     Increase activity slowly    Complete by:  As directed           Current Discharge Medication List    START taking these medications   Details  oseltamivir (TAMIFLU) 30 MG capsule Take 1 capsule (30 mg total) by mouth 2 (two) times daily. Qty: 3 capsule, Refills: 0      CONTINUE these medications which have NOT CHANGED   Details  atorvastatin (LIPITOR) 20 MG tablet TAKE 1 TABLET AT BEDTIME FOR CHOLESTEROL. Qty: 30 tablet, Refills: 0    citalopram (CELEXA) 20 MG tablet TAKE 1 TABLET EACH DAY. Qty: 30 tablet, Refills: 0    donepezil (ARICEPT) 10 MG tablet TAKE ONE TABLET AT BEDTIME. Qty: 30 tablet, Refills: 0    memantine (NAMENDA XR) 28 MG CP24 24 hr capsule Take 28 mg by mouth daily with breakfast.    metoprolol succinate (TOPROL-XL) 25 MG 24 hr tablet Take 25 mg by mouth daily with breakfast.    potassium chloride (MICRO-K) 10 MEQ CR capsule Take 10 mEq by mouth daily.    traZODone (DESYREL) 50 MG tablet TAKE 1 OR 2 TABLETS AT BEDTIME FOR SLEEP. Qty: 60 tablet, Refills: 0    saccharomyces boulardii (FLORASTOR) 250 MG capsule Take 1 capsule (250 mg total) by mouth 2 (two) times  daily. Qty: 15 capsule, Refills: 0       Allergies  Allergen Reactions  . Codeine Nausea And Vomiting      The results of significant diagnostics from this hospitalization (including imaging, microbiology, ancillary and laboratory) are listed below for reference.    Significant Diagnostic Studies: Dg Chest 2 View  01/07/2015  CLINICAL DATA:  Weakness in the legs for 3 hours. Cough for 1 day. History of hypertension. EXAM: CHEST  2 VIEW COMPARISON:  01/08/2015 FINDINGS: Cardiac silhouette is normal in size. No mediastinal or hilar masses or convincing adenopathy. Mild linear scarring noted at the lung bases, stable. Lungs are  otherwise clear. No pleural effusion or pneumothorax. Bony thorax is demineralized but grossly intact. IMPRESSION: No acute cardiopulmonary disease. Electronically Signed   By: Amie Portlandavid  Ormond M.D.   On: 01/07/2015 19:38    Microbiology: Recent Results (from the past 240 hour(s))  Blood culture (routine x 2)     Status: None (Preliminary result)   Collection Time: 01/07/15  6:44 PM  Result Value Ref Range Status   Specimen Description BLOOD RIGHT ANTECUBITAL  Final   Special Requests BOTTLES DRAWN AEROBIC AND ANAEROBIC 4CC  Final   Culture   Final    NO GROWTH 2 DAYS [facility]    Report Status PENDING  Incomplete  Blood culture (routine x 2)     Status: None (Preliminary result)   Collection Time: 01/07/15  6:56 PM  Result Value Ref Range Status   Specimen Description BLOOD BLOOD RIGHT FOREARM  Final   Special Requests BOTTLES DRAWN AEROBIC AND ANAEROBIC 5CC  Final   Culture   Final    NO GROWTH 2 DAYS [facility]formed at [facility]    Report Status PENDING  Incomplete     Labs: Basic Metabolic Panel:  Recent Labs Lab 01/07/15 1846 01/08/15 0920 01/09/15 0505 01/10/15 0500  NA 140 143 142 143  K 3.3* 4.1 3.8 4.1  CL 101 108 111 107  CO2 25 26 22 27   GLUCOSE 172* 233* 133* 163*  BUN 24* 20 24* 23*  CREATININE 0.89 0.77 0.95 0.88  CALCIUM 9.1 9.0 8.8* 9.1  MG  --  2.0  --   --    Liver Function Tests:  Recent Labs Lab 01/07/15 1846  AST 31  ALT 18  ALKPHOS 51  BILITOT 0.7  PROT 7.1  ALBUMIN 3.9   No results for input(s): LIPASE, AMYLASE in the last 168 hours. No results for input(s): AMMONIA in the last 168 hours. CBC:  Recent Labs Lab 01/07/15 1846 01/09/15 0505 01/10/15 0500  WBC 7.0 14.4* 11.4*  NEUTROABS 4.9  --   --   HGB 12.6 11.3* 11.3*  HCT 40.1 35.1* 35.7*  MCV 92.8 93.1 93.2  PLT 168 147* 180   Cardiac Enzymes: No results for input(s): CKTOTAL, CKMB, CKMBINDEX, TROPONINI in the last 168 hours. BNP: BNP (last 3  results) No results for input(s): BNP in the last 8760 hours.  ProBNP (last 3 results) No results for input(s): PROBNP in the last 8760 hours.  CBG: No results for input(s): GLUCAP in the last 168 hours.     SignedRhetta Mura:  Bonnie Mora, JAI-GURMUKH MD   [facility] 01/11/2015, 8:22 AM

## 2015-01-13 LAB — CULTURE, BLOOD (ROUTINE X 2)
Culture: NO GROWTH
Culture: NO GROWTH

## 2015-01-22 ENCOUNTER — Other Ambulatory Visit: Payer: Self-pay | Admitting: Internal Medicine

## 2015-02-13 ENCOUNTER — Other Ambulatory Visit: Payer: Self-pay | Admitting: Internal Medicine

## 2015-02-16 ENCOUNTER — Ambulatory Visit (INDEPENDENT_AMBULATORY_CARE_PROVIDER_SITE_OTHER): Payer: Medicare Other | Admitting: Nurse Practitioner

## 2015-02-16 ENCOUNTER — Ambulatory Visit (INDEPENDENT_AMBULATORY_CARE_PROVIDER_SITE_OTHER)
Admission: RE | Admit: 2015-02-16 | Discharge: 2015-02-16 | Disposition: A | Discharge status: Home / Self Care | Payer: Medicare Other | Source: Ambulatory Visit | Attending: Nurse Practitioner | Admitting: Nurse Practitioner

## 2015-02-16 ENCOUNTER — Telehealth: Payer: Self-pay | Admitting: Internal Medicine

## 2015-02-16 VITALS — BP 160/88 | HR 102 | Temp 97.6°F | Ht 61.0 in

## 2015-02-16 DIAGNOSIS — J189 Pneumonia, unspecified organism: Secondary | ICD-10-CM

## 2015-02-16 MED ORDER — PROMETHAZINE-DM 6.25-15 MG/5ML PO SYRP: 5.0000 mL | ORAL_SOLUTION | Freq: Four times a day (QID) | ORAL | Status: DC | PRN

## 2015-02-16 MED ORDER — ALBUTEROL SULFATE HFA 108 (90 BASE) MCG/ACT IN AERS: 2.0000 | INHALATION_SPRAY | Freq: Four times a day (QID) | RESPIRATORY_TRACT | Status: DC | PRN

## 2015-02-16 NOTE — Progress Notes (Signed)
Patient ID: Bonnie Mora, female    DOB: March 07, 1926  Age: 82 y.o. MRN: [id number]  CC: No chief complaint on file.   HPI [name] presents for CC of wheezing, cough x 1 month. She is accompanied by her son today.   1) Diagnosed 3 weeks ago with pneumonia   Breathing treatment and cough medicine  Urgent Care- cxr and 14 days of levaqin Promethazine-dextro cough syrup   Unknown urgent care (talked with Bonnie Mora who is one caregiver)  Feeling improved after treatment. Now feeling slightly worse   History Bonnie Mora has a past medical history of Parkinson disease (HCC); Dementia; Hyperlipidemia; and Hypertension.   She has past surgical history that includes Joint replacement; Appendectomy; Ovarian cyst surgery; and Tonsillectomy and adenoidectomy.   Her family history includes ALS in her mother; Cancer in her maternal grandfather; Heart disease in her father and maternal grandfather.She reports that she has never smoked. She does not have any smokeless tobacco history on file. She reports that she does not drink alcohol or use illicit drugs.  Outpatient Prescriptions Prior to Visit  Medication Sig Dispense Refill  . atorvastatin (LIPITOR) 20 MG tablet TAKE 1 TABLET AT BEDTIME FOR CHOLESTEROL. 30 tablet 0  . citalopram (CELEXA) 20 MG tablet TAKE 1 TABLET EACH DAY. 30 tablet 0  . donepezil (ARICEPT) 10 MG tablet TAKE ONE TABLET AT BEDTIME. 30 tablet 0  . memantine (NAMENDA XR) 28 MG CP24 24 hr capsule Take 28 mg by mouth daily with breakfast.    . metoprolol succinate (TOPROL-XL) 25 MG 24 hr tablet Take 25 mg by mouth daily with breakfast.    . oseltamivir (TAMIFLU) 30 MG capsule Take 1 capsule (30 mg total) by mouth 2 (two) times daily. 3 capsule 0  . potassium chloride (MICRO-K) 10 MEQ CR capsule Take 10 mEq by mouth daily.    Marland Kitchen saccharomyces boulardii (FLORASTOR) 250 MG capsule Take 1 capsule (250 mg total) by mouth 2 (two) times daily. 15 capsule 0  . traZODone (DESYREL) 50 MG tablet TAKE  1 OR 2 TABLETS AT BEDTIME FOR SLEEP. 60 tablet 0   No facility-administered medications prior to visit.    ROS Review of Systems  Constitutional: Negative for fever, chills, diaphoresis and fatigue.  Respiratory: Positive for cough and wheezing. Negative for chest tightness and shortness of breath.   Cardiovascular: Negative for chest pain, palpitations and leg swelling.  Gastrointestinal: Negative for nausea, vomiting and diarrhea.  Skin: Negative for rash.  Neurological: Negative for dizziness and headaches.    Objective:  BP 160/88 mmHg  Pulse 102  Temp(Src) 97.6 F (36.4 C) (Oral)  Ht  (1.549 m)  SpO2 95%  Physical Exam  Constitutional: She appears well-developed and well-nourished. No distress.  HENT:  Head: Normocephalic and atraumatic.  Right Ear: External ear normal.  Left Ear: External ear normal.  Eyes: Right eye exhibits no discharge. Left eye exhibits no discharge. No scleral icterus.  Neck: Normal range of motion. Neck supple.  Cardiovascular: Normal rate and regular rhythm.   Pulmonary/Chest: Effort normal. No respiratory distress. She has wheezes. She has no rales. She exhibits no tenderness.  Lymphadenopathy:    She has no cervical adenopathy.  Neurological: She is alert. No cranial nerve deficit. She exhibits normal muscle tone. Coordination normal.  Skin: Skin is warm and dry. No rash noted. She is not diaphoretic.  Psychiatric: She has a normal mood and affect. Her behavior is normal. Judgment and thought content normal.  Assessment & Plan:   Diagnoses and all orders for this visit:  CAP (community acquired pneumonia) -     DG Chest 2 View; Future  Other orders -     promethazine-dextromethorphan (PROMETHAZINE-DM) 6.25-15 MG/5ML syrup; Take 5 mLs by mouth 4 (four) times daily as needed for cough. -     albuterol (PROVENTIL HFA;VENTOLIN HFA) 108 (90 Base) MCG/ACT inhaler; Inhale 2 puffs into the lungs every 6 (six) hours as needed for wheezing  or shortness of breath.   I am having Ms. Alaimo start on promethazine-dextromethorphan and albuterol. I am also having her maintain her metoprolol succinate, memantine, saccharomyces boulardii, traZODone, potassium chloride, oseltamivir, citalopram, donepezil, and atorvastatin.  Meds ordered this encounter  Medications  . promethazine-dextromethorphan (PROMETHAZINE-DM) 6.25-15 MG/5ML syrup    Sig: Take 5 mLs by mouth 4 (four) times daily as needed for cough.    Dispense:  118 mL    Refill:  0    Order Specific Question:  Supervising Provider    Answer:  Darrick Huntsman, TERESA L [[id number]]  . albuterol (PROVENTIL HFA;VENTOLIN HFA) 108 (90 Base) MCG/ACT inhaler    Sig: Inhale 2 puffs into the lungs every 6 (six) hours as needed for wheezing or shortness of breath.    Dispense:  1 Inhaler    Refill:  2    Cheapest version, please    Order Specific Question:  Supervising Provider    Answer:  Sherlene Shams [[id number]]     Follow-up: Return if symptoms worsen or fail to improve.

## 2015-02-16 NOTE — Progress Notes (Signed)
Pre visit review using our clinic review tool, if applicable. No additional management support is needed unless otherwise documented below in the visit note. 

## 2015-02-16 NOTE — Telephone Encounter (Signed)
Noted. I have sent results to G. Potter to give to son a few minutes ago. Thank you!

## 2015-02-16 NOTE — Patient Instructions (Signed)
Take the cough syrup as directed   Get your Chest X-ray down stairs   Inhaler as directed

## 2015-02-16 NOTE — Telephone Encounter (Signed)
Pt's son wants you to call him with xray results

## 2015-02-19 ENCOUNTER — Encounter: Payer: Self-pay | Admitting: Nurse Practitioner

## 2015-02-19 NOTE — Assessment & Plan Note (Addendum)
Repeat CXR since unknown amount of time has passed.  Refilled cough syrup. Kim (caregiver) reports it was very helpful. Has used inhalers in the past- refilled today  FU prn worsening/failure to improve.

## 2015-03-05 ENCOUNTER — Other Ambulatory Visit: Payer: Self-pay | Admitting: Internal Medicine

## 2015-04-09 ENCOUNTER — Other Ambulatory Visit: Payer: Self-pay | Admitting: Internal Medicine

## 2015-04-09 NOTE — Telephone Encounter (Signed)
i do see a recent OV addressing dementia----are you ok with refilling the other rx requests?---please advise, thanks

## 2015-04-09 NOTE — Telephone Encounter (Signed)
She was seen in October for this so yes and sent in.

## 2015-04-10 ENCOUNTER — Other Ambulatory Visit: Payer: Self-pay | Admitting: Internal Medicine

## 2015-04-30 ENCOUNTER — Ambulatory Visit: Payer: Medicare Other | Admitting: Internal Medicine

## 2015-04-30 DIAGNOSIS — Z0289 Encounter for other administrative examinations: Secondary | ICD-10-CM

## 2015-05-01 ENCOUNTER — Telehealth: Payer: Self-pay | Admitting: Internal Medicine

## 2015-05-01 NOTE — Telephone Encounter (Signed)
Patient no showed for FU on 4/10.  Please advise.

## 2015-05-02 NOTE — Telephone Encounter (Signed)
Call to see if she wants to reschedule

## 2015-05-02 NOTE — Telephone Encounter (Signed)
Left patient a vm to call back to reschedule fu if she would like.

## 2015-05-14 ENCOUNTER — Encounter: Payer: Self-pay | Admitting: Family

## 2015-05-14 ENCOUNTER — Ambulatory Visit (INDEPENDENT_AMBULATORY_CARE_PROVIDER_SITE_OTHER): Payer: Medicare Other | Admitting: Family

## 2015-05-14 VITALS — BP 118/72 | HR 74 | Temp 98.6°F | Resp 20 | Wt 155.0 lb

## 2015-05-14 DIAGNOSIS — J189 Pneumonia, unspecified organism: Secondary | ICD-10-CM | POA: Diagnosis not present

## 2015-05-14 MED ORDER — ALBUTEROL SULFATE HFA 108 (90 BASE) MCG/ACT IN AERS: 2.0000 | INHALATION_SPRAY | Freq: Four times a day (QID) | RESPIRATORY_TRACT | Status: DC | PRN

## 2015-05-14 MED ORDER — PROMETHAZINE-DM 6.25-15 MG/5ML PO SYRP: 5.0000 mL | ORAL_SOLUTION | Freq: Four times a day (QID) | ORAL | Status: DC | PRN

## 2015-05-14 NOTE — Progress Notes (Signed)
Pre visit review using our clinic review tool, if applicable. No additional management support is needed unless otherwise documented below in the visit note. 

## 2015-05-14 NOTE — Progress Notes (Signed)
Subjective:    Patient ID: Bonnie Mora, female    DOB: [birth year], 82 y.o.   MRN: [id number]   Bonnie Mora is a 82 y.o. female who presents today for an acute visit.    HPI Comments: Went to [facility] and diagnosed with RLL PNA yesterday. Started on 10 day course moxifloxacin. Patient and family told to wait on prednisone until reevaluated.  Per care giver, ' doesn't even know she is sick'. Using albuterol inhaler. Caregiver notes little wet cough and scratchy cough. At baseline, wheezes however not worse with PNA. No fever, SOB.   Notes PNA in 12/2014 which resolved with levaquin.     Per chart review, patient had chest x-ray in January which was negative for cardiopulmonary disease. Patient was also seen in the emergency room in December for flu.    Past Medical History  Diagnosis Date  . Parkinson disease (HCC)   . Dementia   . Hyperlipidemia   . Hypertension    Codeine Current Outpatient Prescriptions on File Prior to Visit  Medication Sig Dispense Refill  . atorvastatin (LIPITOR) 20 MG tablet TAKE 1 TABLET AT BEDTIME FOR CHOLESTEROL. 90 tablet 3  . citalopram (CELEXA) 20 MG tablet TAKE 1 TABLET EACH DAY. 90 tablet 1  . donepezil (ARICEPT) 10 MG tablet TAKE ONE TABLET AT BEDTIME. 90 tablet 3  . memantine (NAMENDA XR) 28 MG CP24 24 hr capsule Take 28 mg by mouth daily with breakfast.    . metoprolol succinate (TOPROL-XL) 25 MG 24 hr tablet Take 25 mg by mouth daily with breakfast.    . potassium chloride (MICRO-K) 10 MEQ CR capsule Take 10 mEq by mouth daily.    Marland Kitchen saccharomyces boulardii (FLORASTOR) 250 MG capsule Take 1 capsule (250 mg total) by mouth 2 (two) times daily. 15 capsule 0  . traZODone (DESYREL) 50 MG tablet TAKE 1 OR 2 TABLETS AT BEDTIME FOR SLEEP. 60 tablet 0   No current facility-administered medications on file prior to visit.    Social History  Substance Use Topics  . Smoking status: Never Smoker   . Smokeless tobacco: None  . Alcohol Use: No     Review of Systems  Constitutional: Negative for fever, chills and unexpected weight change.  HENT: Negative for congestion, ear pain, sinus pressure and sore throat.   Eyes: Negative for visual disturbance.  Respiratory: Positive for cough and wheezing. Negative for shortness of breath.   Cardiovascular: Negative for chest pain, palpitations and leg swelling.  Gastrointestinal: Negative for nausea and vomiting.  Musculoskeletal: Negative for myalgias and arthralgias.  Skin: Negative for rash.  Neurological: Negative for headaches.  Hematological: Negative for adenopathy.      Objective:    BP 118/72 mmHg  Pulse 74  Temp(Src) 98.6 F (37 C) (Oral)  Resp 20  Wt 155 lb (70.308 kg)  SpO2 94%   Physical Exam  Constitutional: She appears well-developed and well-nourished.  Eyes: Conjunctivae are normal.  Cardiovascular: Normal rate, regular rhythm, normal heart sounds and normal pulses.   Pulmonary/Chest: Effort normal and breath sounds normal. She has no wheezes. She has no rhonchi. She has no rales.  Neurological: She is alert.  Skin: Skin is warm and dry.  Psychiatric: She has a normal mood and affect. Her speech is normal and behavior is normal. Thought content normal.  Vitals reviewed.      Assessment & Plan:   1. CAP (community acquired pneumonia) Lung sounds clear. No respiratory distress. Patient is afebrile  and doing well per caregiver and son. Repeat chest x-ray in 4-6 weeks. Advised not to take prednisone as patient is not wheezing or short of breath. She also has no underlying lung disease.  - albuterol (PROVENTIL HFA;VENTOLIN HFA) 108 (90 Base) MCG/ACT inhaler; Inhale 2 puffs into the lungs every 6 (six) hours as needed for wheezing or shortness of breath.  Dispense: 1 Inhaler; Refill: 2 - DG Chest 2 View; Future - promethazine-dextromethorphan (PROMETHAZINE-DM) 6.25-15 MG/5ML syrup; Take 5 mLs by mouth 4 (four) times daily as needed for cough.  Dispense: 118  mL; Refill: 0   I have discontinued Ms. Gartley's oseltamivir. I am also having her maintain her metoprolol succinate, memantine, saccharomyces boulardii, potassium chloride, citalopram, donepezil, atorvastatin, traZODone, albuterol, and promethazine-dextromethorphan.   Meds ordered this encounter  Medications  . albuterol (PROVENTIL HFA;VENTOLIN HFA) 108 (90 Base) MCG/ACT inhaler    Sig: Inhale 2 puffs into the lungs every 6 (six) hours as needed for wheezing or shortness of breath.    Dispense:  1 Inhaler    Refill:  2    Cheapest version, please    Order Specific Question:  Supervising Provider    Answer:  Tresa GarterPLOTNIKOV, ALEKSEI V [[id number]]  . promethazine-dextromethorphan (PROMETHAZINE-DM) 6.25-15 MG/5ML syrup    Sig: Take 5 mLs by mouth 4 (four) times daily as needed for cough.    Dispense:  118 mL    Refill:  0    Order Specific Question:  Supervising Provider    Answer:  Tresa GarterPLOTNIKOV, ALEKSEI V [[id number]]     Start medications as prescribed and explained to patient on After Visit Summary ( AVS). Risks, benefits, and alternatives of the medications and treatment plan prescribed today were discussed, and patient expressed understanding.   Education regarding symptom management and diagnosis given to patient.   Follow-up:Plan follow-up as discussed or as needed if any worsening symptoms or change in condition. No Follow-up on file.   Continue to follow with Myrlene BrokerElizabeth A Crawford, MD for routine health maintenance.   Anson Croftsorothy J Aken and I agreed with plan.   Rennie PlowmanMargaret Akiem Urieta, FNP

## 2015-05-14 NOTE — Patient Instructions (Signed)
Repeat chest xray in 4-6 weeks.   Increase intake of clear fluids. Congestion is best treated by hydration, when mucus is wetter, it is thinner, less sticky, and easier to expel from the body, either through coughing up drainage, or by blowing your nose.   Get plenty of rest.   Use saline nasal drops and blow your nose frequently. Run a humidifier at night and elevate the head of the bed. Vicks Vapor rub will help with congestion and cough. Steam showers and sinus massage for congestion.   Use Acetaminophen or Ibuprofen as needed for fever or pain. Avoid second hand smoke. Even the smallest exposure will worsen symptoms.   Over the counter medications you can try include Delsym for cough, a decongestant for congestion, and Mucinex or Robitussin as an expectorant. Be sure to just get the plain Mucinex or Robitussin that just has one medication (Guaifenesen). We don't recommend the combination products. Note, be sure to drink two glasses of water with each dose of Mucinex as the medication will not work well without adequate hydration.   You can also try a teaspoon of honey to see if this will help reduce cough. Throat lozenges can sometimes be beneficial as well.    This illness will typically last 7 - 10 days.   Please follow up with our clinic if you develop a fever greater than 101 F, symptoms worsen, or do not resolve in the next week.

## 2015-06-12 ENCOUNTER — Other Ambulatory Visit: Payer: Self-pay | Admitting: Internal Medicine

## 2015-06-13 MED ORDER — POTASSIUM CHLORIDE ER 10 MEQ PO CPCR: 10.0000 meq | ORAL_CAPSULE | Freq: Every day | ORAL | Status: DC

## 2015-06-26 ENCOUNTER — Other Ambulatory Visit: Payer: Self-pay | Admitting: Internal Medicine

## 2015-09-27 ENCOUNTER — Other Ambulatory Visit: Payer: Self-pay | Admitting: Internal Medicine

## 2015-12-24 ENCOUNTER — Other Ambulatory Visit: Payer: Self-pay | Admitting: Internal Medicine

## 2016-01-31 IMAGING — DX DG CHEST 2V
2 series · 2 of 2 positions shown · non-contrast
Comparison: 01/07/2015

CLINICAL DATA: Recent diagnosis of pneumonia.

EXAM:
CHEST  2 VIEW

[chest pa]
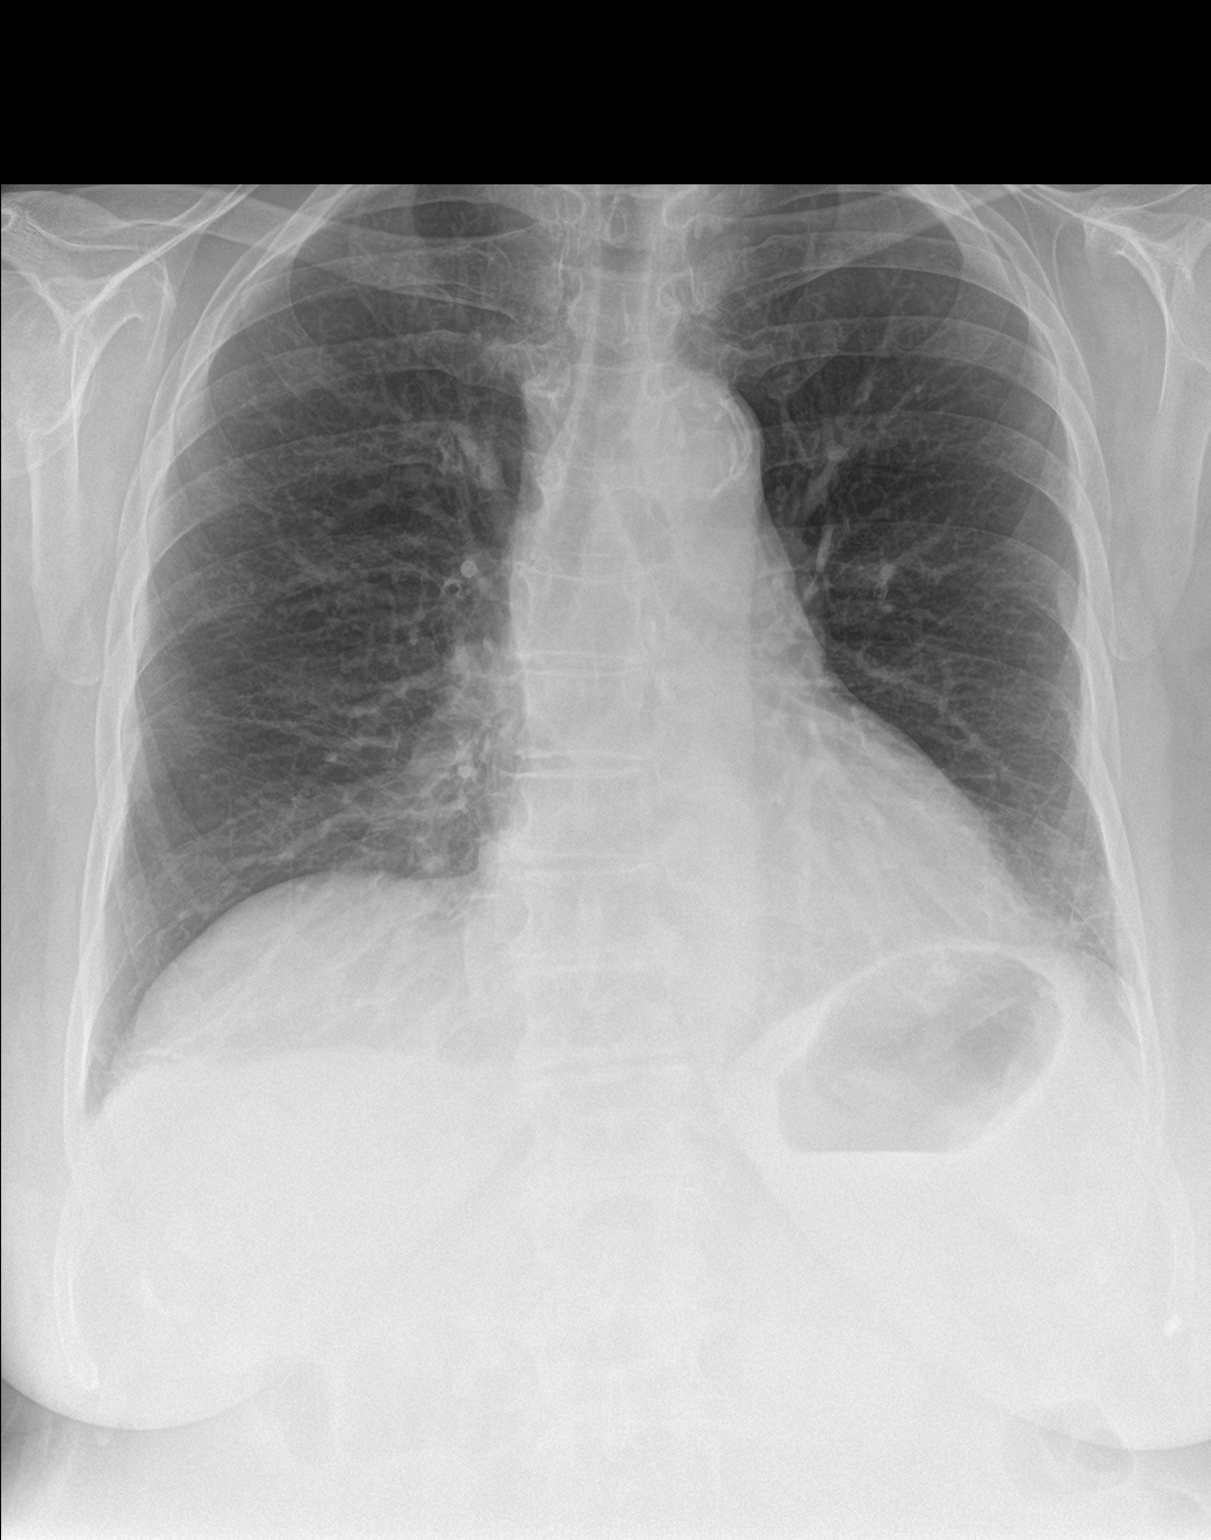

[chest lat]
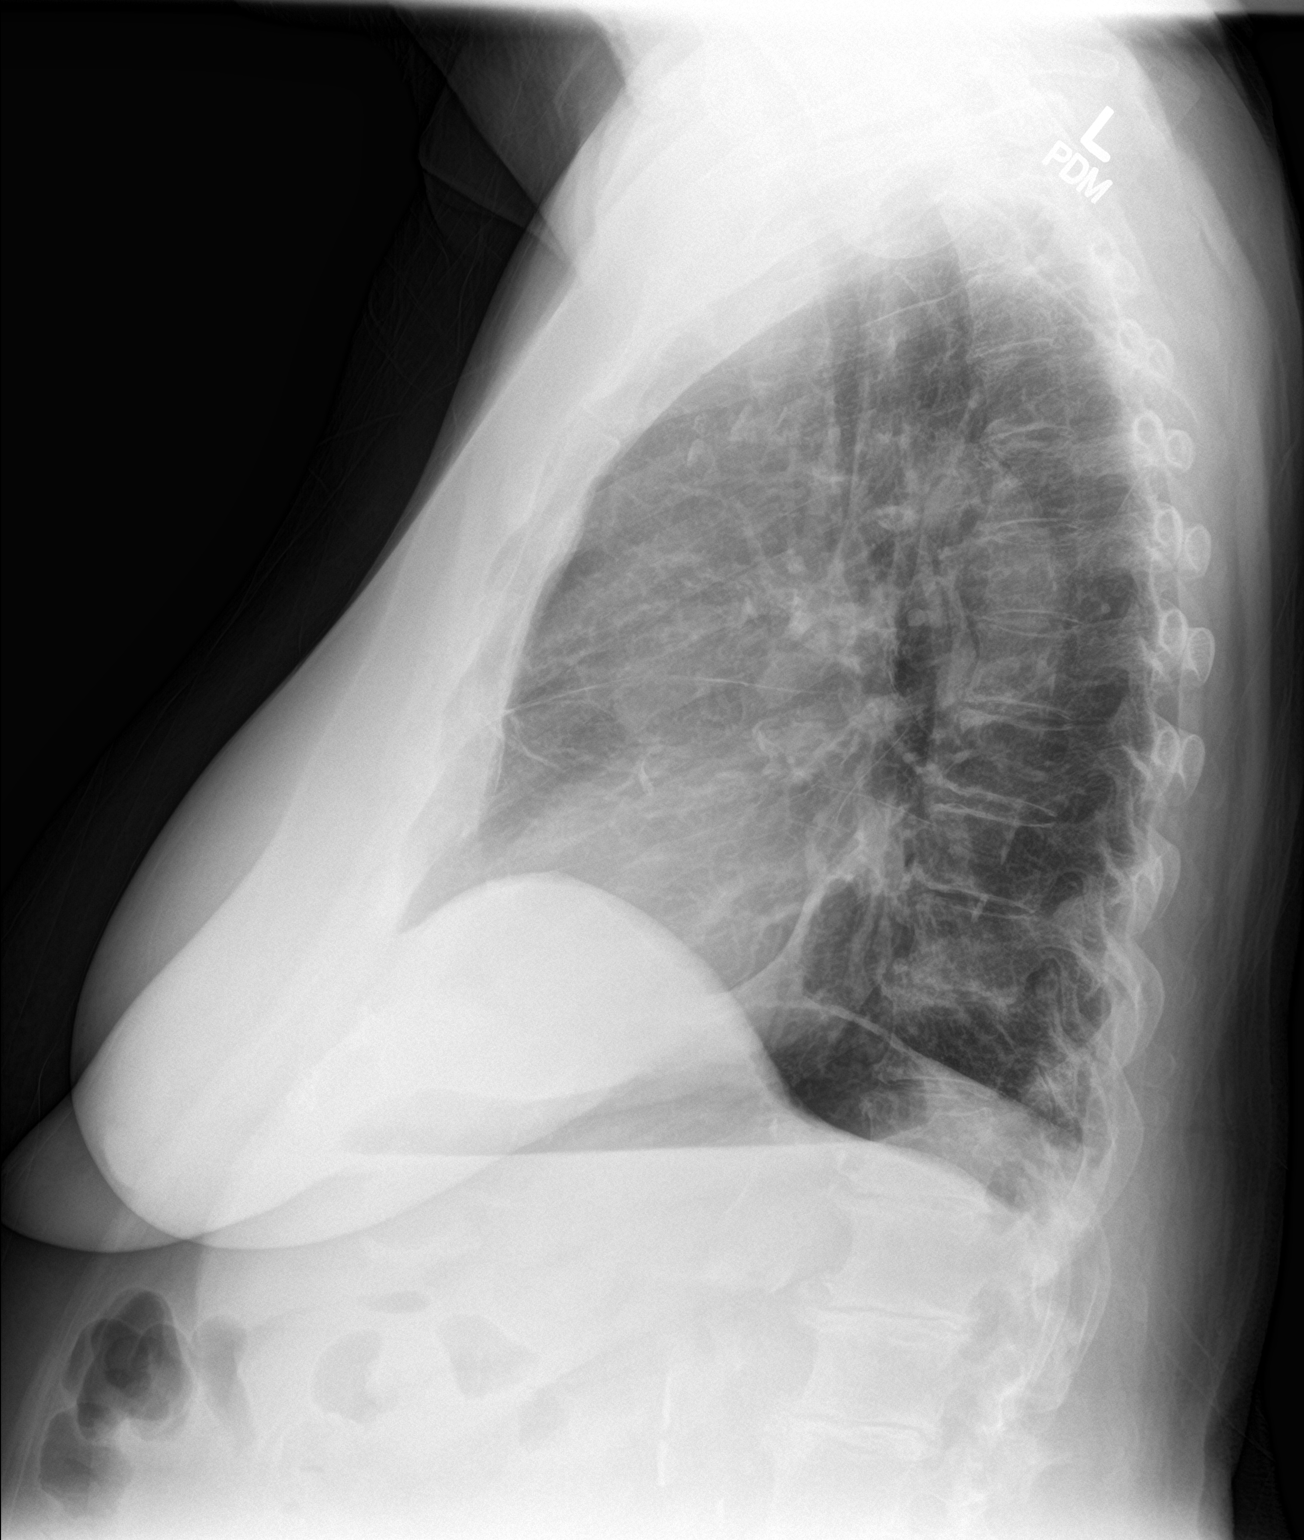

[2 of 2 positions shown; findings below may reference images not displayed]

FINDINGS: Cardiomediastinal silhouette is normal. Mediastinal contours appear
intact.

There is no evidence of focal airspace consolidation, pleural
effusion or pneumothorax. Calcific atherosclerotic disease of the
aorta are is noted.

Osseous structures are without acute abnormality. Soft tissues are
grossly normal.
IMPRESSION: No active cardiopulmonary disease.

Calcified atherosclerotic disease of the aorta.

## 2016-03-21 ENCOUNTER — Other Ambulatory Visit: Payer: Self-pay | Admitting: Internal Medicine

## 2016-03-21 NOTE — Telephone Encounter (Signed)
Sent in a months worth of medications and tried to contact patient, unable to talk to patient but spoke with pharmacy to inform patient that she needs visit for refills

## 2016-04-21 ENCOUNTER — Other Ambulatory Visit: Payer: Self-pay | Admitting: Internal Medicine

## 2016-04-22 ENCOUNTER — Encounter: Payer: Medicare Other | Admitting: Internal Medicine

## 2016-04-25 ENCOUNTER — Encounter: Payer: Self-pay | Admitting: Internal Medicine

## 2016-04-25 ENCOUNTER — Other Ambulatory Visit (INDEPENDENT_AMBULATORY_CARE_PROVIDER_SITE_OTHER): Payer: Medicare Other

## 2016-04-25 ENCOUNTER — Ambulatory Visit (INDEPENDENT_AMBULATORY_CARE_PROVIDER_SITE_OTHER): Payer: Medicare Other | Admitting: Internal Medicine

## 2016-04-25 VITALS — BP 160/110 | HR 71 | Temp 98.0°F | Resp 14 | Ht 61.0 in | Wt 146.0 lb

## 2016-04-25 DIAGNOSIS — E785 Hyperlipidemia, unspecified: Secondary | ICD-10-CM | POA: Diagnosis not present

## 2016-04-25 DIAGNOSIS — G301 Alzheimer's disease with late onset: Secondary | ICD-10-CM

## 2016-04-25 DIAGNOSIS — Z0000 Encounter for general adult medical examination without abnormal findings: Secondary | ICD-10-CM | POA: Diagnosis not present

## 2016-04-25 DIAGNOSIS — F02818 Dementia in other diseases classified elsewhere, unspecified severity, with other behavioral disturbance: Secondary | ICD-10-CM

## 2016-04-25 DIAGNOSIS — K591 Functional diarrhea: Secondary | ICD-10-CM

## 2016-04-25 DIAGNOSIS — I10 Essential (primary) hypertension: Secondary | ICD-10-CM | POA: Diagnosis not present

## 2016-04-25 DIAGNOSIS — F0281 Dementia in other diseases classified elsewhere with behavioral disturbance: Secondary | ICD-10-CM

## 2016-04-25 LAB — COMPREHENSIVE METABOLIC PANEL
ALT: 13 U/L (ref 0–35)
AST: 15 U/L (ref 0–37)
Albumin: 3.8 g/dL (ref 3.5–5.2)
Alkaline Phosphatase: 70 U/L (ref 39–117)
BUN: 20 mg/dL (ref 6–23)
CALCIUM: 9.4 mg/dL (ref 8.4–10.5)
CHLORIDE: 103 meq/L (ref 96–112)
CO2: 31 mEq/L (ref 19–32)
CREATININE: 0.7 mg/dL (ref 0.40–1.20)
GFR: 83.93 mL/min (ref >60.00)
Glucose, Bld: 117 mg/dL — ABNORMAL HIGH (ref 70–99)
Potassium: 4 mEq/L (ref 3.5–5.1)
Sodium: 140 mEq/L (ref 135–145)
Total Bilirubin: 0.5 mg/dL (ref 0.2–1.2)
Total Protein: 7.1 g/dL (ref 6.0–8.3)

## 2016-04-25 LAB — LIPID PANEL
CHOLESTEROL: 153 mg/dL (ref 0–200)
HDL: 73.9 mg/dL (ref >39.00)
LDL CALC: 60 mg/dL (ref 0–99)
NonHDL: 78.95
TRIGLYCERIDES: 93 mg/dL (ref 0.0–149.0)
Total CHOL/HDL Ratio: 2
VLDL: 18.6 mg/dL (ref 0.0–40.0)

## 2016-04-25 LAB — CBC
HEMATOCRIT: 41 % (ref 36.0–46.0)
Hemoglobin: 13.4 g/dL (ref 12.0–15.0)
MCHC: 32.7 g/dL (ref 30.0–36.0)
MCV: 89.8 fl (ref 78.0–100.0)
Platelets: 270 10*3/uL (ref 150.0–400.0)
RBC: 4.56 Mil/uL (ref 3.87–5.11)
RDW: 14 % (ref 11.5–15.5)
WBC: 7.6 10*3/uL (ref 4.0–10.5)

## 2016-04-25 MED ORDER — ATORVASTATIN CALCIUM 20 MG PO TABS: 20.0000 mg | ORAL_TABLET | Freq: Every day | ORAL | 3 refills | Status: DC

## 2016-04-25 MED ORDER — TRAZODONE HCL 100 MG PO TABS: 100.0000 mg | ORAL_TABLET | Freq: Every day | ORAL | 3 refills | Status: AC

## 2016-04-25 MED ORDER — CITALOPRAM HYDROBROMIDE 20 MG PO TABS: 20.0000 mg | ORAL_TABLET | Freq: Every day | ORAL | 3 refills | Status: DC

## 2016-04-25 MED ORDER — METOPROLOL SUCCINATE ER 50 MG PO TB24: 50.0000 mg | ORAL_TABLET | Freq: Every day | ORAL | 3 refills | Status: DC

## 2016-04-25 MED ORDER — LORAZEPAM 0.5 MG PO TABS: 0.5000 mg | ORAL_TABLET | Freq: Every day | ORAL | 3 refills | Status: DC

## 2016-04-25 NOTE — Patient Instructions (Signed)
We have sent in the prescription for the ativan to use at night time for the sundowning.   We would like you to stop aricept as it could be causing or worsening the diarrhea.   We would like you to stop the potassium as she does not need it anymore.  You guys can think about stopping the namenda for a time period to see if you think it was helping for memory.

## 2016-04-25 NOTE — Assessment & Plan Note (Signed)
Checking labs, aged out of colonoscopy. Declines any immunizations today. Counseled about memory progression and fall risk reduction in the home. Aged out of mammogram and pap smear. Given 10 year screening recommendations.

## 2016-04-25 NOTE — Assessment & Plan Note (Signed)
Taking lipitor 20 mg daily and they elect to continue as she has no side effects from it. We talked about declining benefit given lack of 10 year life expectancy.

## 2016-04-25 NOTE — Assessment & Plan Note (Signed)
Will stop aricept as this could be related. She does have IBS which could be a contributor. Stable for some time.

## 2016-04-25 NOTE — Progress Notes (Addendum)
   Subjective:    Patient ID: Bonnie Mora, female    DOB: 1926/04/21, 83 y.o.   MRN: [id number]  HPI Here for medicare wellness and physical, no new complaints. Please see A/P for status and treatment of chronic medical problems.   Diet: heart healthy Physical activity: sedentary Depression/mood screen: negative Hearing: intact to whispered voice, mild loss bilaterally Visual acuity: grossly normal, due to dementia no eye exam ADLs: assist for most ADLs Fall risk: medium Home safety: poor insight Cognitive evaluation: intact to name, able to follow 1 step commands EOL planning: adv directives discussed  I have personally reviewed and have noted 1. The patient's medical and social history - reviewed today no changes 2. Their use of alcohol, tobacco or illicit drugs 3. Their current medications and supplements 4. The patient's functional ability including ADL's, fall risks, home safety risks and hearing or visual impairment. 5. Diet and physical activities 6. Evidence for depression or mood disorders 7. Care team reviewed and updated (available in snapshot)  Review of Systems  Unable to perform ROS: Dementia      Objective:   Physical Exam  Constitutional: She appears well-developed and well-nourished.  HENT:  Head: Normocephalic and atraumatic.  Eyes: EOM are normal.  Neck: Normal range of motion.  Cardiovascular: Normal rate and regular rhythm.   Pulmonary/Chest: Effort normal and breath sounds normal. No respiratory distress. She has no wheezes. She has no rales.  Abdominal: Soft. Bowel sounds are normal. She exhibits no distension. There is no tenderness. There is no rebound.  Musculoskeletal: She exhibits no edema.  Neurological: She is alert. Coordination normal.  Able to follow 1 step command  Skin: Skin is warm and dry.   Vitals:   04/25/16 1006  BP: (!) 160/110  Pulse: 71  Resp: 14  Temp: 98 F (36.7 C)  TempSrc: Oral  SpO2: 95%  Weight: 146 lb (66.2 kg)   Height:  (1.549 m)       Assessment & Plan:

## 2016-04-25 NOTE — Assessment & Plan Note (Signed)
BP up today as she is out of meds. Refills given today. Checking CMP and adjust as needed. Stopped potassium as she is not on anything to lower it and was likely started acutely and never stopped.

## 2016-04-25 NOTE — Assessment & Plan Note (Signed)
Worsening and more sundowning now. Due to fall risk and risk of harm to herself rx for ativan given today for symptoms. Talked to her family about likely progression with time. She needs full assist for ADLs at this time. Talked to them about home health and they decline at this time. Stopping aricept due to possible side effect. They will think about namenda as it is expensive and they do not see any benefit with it. We discussed that we can not predict how much decline she may have had with it versus without namenda.

## 2016-04-25 NOTE — Progress Notes (Signed)
Pre visit review using our clinic review tool, if applicable. No additional management support is needed unless otherwise documented below in the visit note. 

## 2016-08-19 ENCOUNTER — Encounter: Payer: Self-pay | Admitting: Nurse Practitioner

## 2016-08-19 ENCOUNTER — Other Ambulatory Visit: Payer: Medicare Other

## 2016-08-19 ENCOUNTER — Ambulatory Visit (INDEPENDENT_AMBULATORY_CARE_PROVIDER_SITE_OTHER): Payer: Medicare Other | Admitting: Nurse Practitioner

## 2016-08-19 VITALS — BP 134/56 | HR 75 | Temp 98.3°F | Ht 61.0 in | Wt 154.0 lb

## 2016-08-19 DIAGNOSIS — R4182 Altered mental status, unspecified: Secondary | ICD-10-CM

## 2016-08-19 DIAGNOSIS — F02818 Dementia in other diseases classified elsewhere, unspecified severity, with other behavioral disturbance: Secondary | ICD-10-CM

## 2016-08-19 DIAGNOSIS — G8929 Other chronic pain: Secondary | ICD-10-CM | POA: Diagnosis not present

## 2016-08-19 DIAGNOSIS — G301 Alzheimer's disease with late onset: Secondary | ICD-10-CM | POA: Diagnosis not present

## 2016-08-19 DIAGNOSIS — F0281 Dementia in other diseases classified elsewhere with behavioral disturbance: Secondary | ICD-10-CM

## 2016-08-19 DIAGNOSIS — M25511 Pain in right shoulder: Secondary | ICD-10-CM

## 2016-08-19 DIAGNOSIS — M25512 Pain in left shoulder: Secondary | ICD-10-CM

## 2016-08-19 LAB — POCT URINALYSIS DIPSTICK
Bilirubin, UA: NEGATIVE
Blood, UA: NEGATIVE
GLUCOSE UA: NEGATIVE
Ketones, UA: NEGATIVE
LEUKOCYTES UA: NEGATIVE
Nitrite, UA: NEGATIVE
PROTEIN UA: NEGATIVE
UROBILINOGEN UA: 0.2 U/dL
pH, UA: 6 (ref 5.0–8.0)

## 2016-08-19 MED ORDER — ALPRAZOLAM 0.5 MG PO TABS: 0.2500 mg | ORAL_TABLET | Freq: Two times a day (BID) | ORAL | 0 refills | Status: DC | PRN

## 2016-08-19 NOTE — Patient Instructions (Addendum)
You will be contacted with urine culture results.  Encourage adequate oral hydration.  

## 2016-08-19 NOTE — Progress Notes (Signed)
Subjective:  Patient ID: Bonnie Mora, female    DOB: 19-May-1926  Age: 83 y.o. MRN: [id number]  CC: Urinary Tract Infection (had uti alot and abx doesnt work/crying and get upset)   HPI Accompanied by son and caregiver.  Altered mental Status: Son and caregiver reports increased agitation after 3pm every day. Chronic, but worsening over that last 2weeks. No change in appetite, decrease fluid intake. No decrease in urine output. Denies any pain Denies any fall since last OV  Caregiver reports Bonnie Mora was Unable to tolerate ativan (lethargy and agitation). Medication was stopped after 2weeks of use.  Stopped namenda use over 56month ago due to lack of change in mental status and side effects (ABD pain and diarrhea).  Chronic right shoulder pain: Managed by ortho with meloxicam and joint injection. Last joint injection done 56month ago meloxicam prescribed [facility] (Bonnie Mora).  Outpatient Medications Prior to Visit  Medication Sig Dispense Refill  . albuterol (PROVENTIL HFA;VENTOLIN HFA) 108 (90 Base) MCG/ACT inhaler Inhale 2 puffs into the lungs every 6 (six) hours as needed for wheezing or shortness of breath. 1 Inhaler 2  . atorvastatin (LIPITOR) 20 MG tablet Take 1 tablet (20 mg total) by mouth daily at 6 PM. 90 tablet 3  . citalopram (CELEXA) 20 MG tablet Take 1 tablet (20 mg total) by mouth daily. 90 tablet 3  . metoprolol succinate (TOPROL-XL) 50 MG 24 hr tablet Take 1 tablet (50 mg total) by mouth daily with breakfast. 90 tablet 3  . traZODone (DESYREL) 100 MG tablet Take 1 tablet (100 mg total) by mouth at bedtime. 90 tablet 3  . LORazepam (ATIVAN) 0.5 MG tablet Take 1 tablet (0.5 mg total) by mouth at bedtime. 30 tablet 3  . NAMENDA XR 28 MG CP24 24 hr capsule TAKE (1) CAPSULE DAILY. (Patient not taking: Reported on 08/19/2016) 90 capsule 0   No facility-administered medications prior to visit.     ROS Review of Systems  Constitutional: Negative for  malaise/fatigue and weight loss.  Respiratory: Negative.   Cardiovascular: Negative.   Gastrointestinal: Negative for abdominal pain, blood in stool, constipation, diarrhea, melena, nausea and vomiting.  Genitourinary: Positive for frequency.  Skin: Negative.   Neurological: Negative for weakness.  Psychiatric/Behavioral: Positive for hallucinations and memory loss. Negative for suicidal ideas. The patient is nervous/anxious. The patient does not have insomnia.      Objective:  BP (!) 134/56   Pulse 75   Temp 98.3 F (36.8 C)   Ht 5\' 1"  (1.549 m)   Wt 154 lb (69.9 kg)   SpO2 95%   BMI 29.10 kg/m   BP Readings from Last 3 Encounters:  08/19/16 (!) 134/56  04/25/16 (!) 160/110  05/14/15 118/72    Wt Readings from Last 3 Encounters:  08/19/16 154 lb (69.9 kg)  04/25/16 146 lb (66.2 kg)  05/14/15 155 lb (70.3 kg)    Physical Exam  Constitutional: No distress.  Cardiovascular: Normal rate, regular rhythm and normal heart sounds.   Pulmonary/Chest: Effort normal and breath sounds normal.  Abdominal: Soft. Bowel sounds are normal. She exhibits no distension. There is no tenderness.  Musculoskeletal: She exhibits no edema or tenderness.  Neurological: She is alert.  Able to follow simple instructions. Answers some questions appropriately. Oriented to self only. Unable to recognize son and caregiver by name.  Skin: Skin is warm and dry.  Vitals reviewed.  CBC    Component Value Date/Time   WBC 7.6 04/25/2016 1050  RBC 4.56 04/25/2016 1050   HGB 13.4 04/25/2016 1050   HCT 41.0 04/25/2016 1050   PLT 270.0 04/25/2016 1050   MCV 89.8 04/25/2016 1050   MCH 29.5 01/10/2015 0500   MCHC 32.7 04/25/2016 1050   RDW 14.0 04/25/2016 1050   LYMPHSABS 1.7 01/07/2015 1846   MONOABS 0.4 01/07/2015 1846   EOSABS 0.0 01/07/2015 1846   BASOSABS 0.0 01/07/2015 1846   CMP     Component Value Date/Time   NA 140 04/25/2016 1050   K 4.0 04/25/2016 1050   CL 103 04/25/2016 1050     CO2 31 04/25/2016 1050   GLUCOSE 117 (H) 04/25/2016 1050   BUN 20 04/25/2016 1050   CREATININE 0.70 04/25/2016 1050   CALCIUM 9.4 04/25/2016 1050   PROT 7.1 04/25/2016 1050   ALBUMIN 3.8 04/25/2016 1050   AST 15 04/25/2016 1050   ALT 13 04/25/2016 1050   ALKPHOS 70 04/25/2016 1050   BILITOT 0.5 04/25/2016 1050   GFRNONAA 58 (L) 01/10/2015 0500   GFRAA >60 01/10/2015 0500    Dg Chest 2 View  Result Date: 02/16/2015 CLINICAL DATA:  Recent diagnosis of pneumonia. EXAM: CHEST  2 VIEW COMPARISON:  01/07/2015 FINDINGS: Cardiomediastinal silhouette is normal. Mediastinal contours appear intact. There is no evidence of focal airspace consolidation, pleural effusion or pneumothorax. Calcific atherosclerotic disease of the aorta are is noted. Osseous structures are without acute abnormality. Soft tissues are grossly normal. IMPRESSION: No active cardiopulmonary disease. Calcified atherosclerotic disease of the aorta. Electronically Signed   By: Ted Mcalpineobrinka  Dimitrova M.D.   On: 02/16/2015 13:11    Assessment & Plan:   Bonnie Mora was seen today for urinary tract infection.  Diagnoses and all orders for this visit:  Altered mental status, unspecified altered mental status type -     POCT urinalysis dipstick -     Urine Culture; Future  Late onset Alzheimer's disease with behavioral disturbance -     ALPRAZolam (XANAX) 0.5 MG tablet; Take 0.5-1 tablets (0.25-0.5 mg total) by mouth every 12 (twelve) hours as needed for anxiety.  Chronic right shoulder pain   I have discontinued Bonnie Mora's NAMENDA XR and LORazepam. I am also having her start on ALPRAZolam. Additionally, I am having her maintain her albuterol, traZODone, atorvastatin, citalopram, metoprolol succinate, and meloxicam.  Meds ordered this encounter  Medications  . meloxicam (MOBIC) 7.5 MG tablet    Sig: Take 1 tablet (7.5 mg total) by mouth daily.    Dispense:  30 tablet    Refill:  0    Order Specific Question:   Supervising  Provider    Answer:   Tresa GarterPLOTNIKOV, ALEKSEI V [[id number]]  . ALPRAZolam (XANAX) 0.5 MG tablet    Sig: Take 0.5-1 tablets (0.25-0.5 mg total) by mouth every 12 (twelve) hours as needed for anxiety.    Dispense:  14 tablet    Refill:  0    Order Specific Question:   Supervising Provider    Answer:   Tresa GarterPLOTNIKOV, ALEKSEI V [[id number]]    Follow-up: Return if symptoms worsen or fail to improve.  Alysia Pennaharlotte Blen Ransome, NP

## 2016-08-20 LAB — URINE CULTURE

## 2016-08-26 ENCOUNTER — Encounter: Payer: Self-pay | Admitting: Nurse Practitioner

## 2016-09-18 ENCOUNTER — Ambulatory Visit: Payer: Medicare Other | Admitting: Nurse Practitioner

## 2016-11-03 ENCOUNTER — Other Ambulatory Visit: Payer: Medicare Other

## 2016-11-03 ENCOUNTER — Ambulatory Visit (INDEPENDENT_AMBULATORY_CARE_PROVIDER_SITE_OTHER)
Admission: RE | Admit: 2016-11-03 | Discharge: 2016-11-03 | Disposition: A | Discharge status: Home / Self Care | Payer: Medicare Other | Source: Ambulatory Visit | Attending: Nurse Practitioner | Admitting: Nurse Practitioner

## 2016-11-03 ENCOUNTER — Ambulatory Visit (INDEPENDENT_AMBULATORY_CARE_PROVIDER_SITE_OTHER): Payer: Medicare Other | Admitting: Nurse Practitioner

## 2016-11-03 ENCOUNTER — Encounter: Payer: Self-pay | Admitting: Nurse Practitioner

## 2016-11-03 VITALS — BP 120/56 | HR 73 | Temp 98.5°F | Ht 61.0 in | Wt 152.0 lb

## 2016-11-03 DIAGNOSIS — R0602 Shortness of breath: Secondary | ICD-10-CM

## 2016-11-03 DIAGNOSIS — R410 Disorientation, unspecified: Secondary | ICD-10-CM

## 2016-11-03 DIAGNOSIS — K521 Toxic gastroenteritis and colitis: Secondary | ICD-10-CM

## 2016-11-03 DIAGNOSIS — R350 Frequency of micturition: Secondary | ICD-10-CM

## 2016-11-03 DIAGNOSIS — R05 Cough: Secondary | ICD-10-CM

## 2016-11-03 DIAGNOSIS — R059 Cough, unspecified: Secondary | ICD-10-CM

## 2016-11-03 LAB — POCT URINALYSIS DIPSTICK
Bilirubin, UA: NEGATIVE
Blood, UA: NEGATIVE
Glucose, UA: NEGATIVE
Ketones, UA: 5
LEUKOCYTES UA: NEGATIVE
NITRITE UA: NEGATIVE
PH UA: 5.5 (ref 5.0–8.0)
Spec Grav, UA: >=1.03 — AB (ref 1.010–1.025)
Urobilinogen, UA: 0.2 E.U./dL

## 2016-11-03 MED ORDER — PROMETHAZINE-DM 6.25-15 MG/5ML PO SYRP: 5.0000 mL | ORAL_SOLUTION | Freq: Three times a day (TID) | ORAL | 0 refills | Status: DC | PRN

## 2016-11-03 MED ORDER — SACCHAROMYCES BOULARDII 250 MG PO CAPS
250.0000 mg | ORAL_CAPSULE | Freq: Two times a day (BID) | ORAL | 0 refills | Status: DC
Start: 2016-11-03 — End: 2017-03-10

## 2016-11-03 NOTE — Patient Instructions (Addendum)
Normal urinalysis. Urine culture sent for culture.  Push oral hydration with water and pedialyte.  Poorly inflated lungs in lower lobes. With recent oral abx use, no additional oral needed at this time. Use cough medication as prescribed. Return to office if no improvement in cough in 2weeks. May need to repeat CXR then.  If diarrhea does not resolve in 1week, return to office. Push oral hydration and maintain bland diet for 2days. Advance diet as tolerated.  Need to discuss additional xanax refill with pcp: Bonnie Mora.  Stop oral abx at this time.   Dehydration, Adult Dehydration is when there is not enough fluid or water in your body. This happens when you lose more fluids than you take in. Dehydration can range from mild to very bad. It should be treated right away to keep it from getting very bad. Symptoms of mild dehydration may include:  Thirst.  Dry lips.  Slightly dry mouth.  Dry, warm skin.  Dizziness. Symptoms of moderate dehydration may include:  Very dry mouth.  Muscle cramps.  Dark pee (urine). Pee may be the color of tea.  Your body making less pee.  Your eyes making fewer tears.  Heartbeat that is uneven or faster than normal (palpitations).  Headache.  Light-headedness, especially when you stand up from sitting.  Fainting (syncope). Symptoms of very bad dehydration may include:  Changes in skin, such as: ? Cold and clammy skin. ? Blotchy (mottled) or pale skin. ? Skin that does not quickly return to normal after being lightly pinched and let go (poor skin turgor).  Changes in body fluids, such as: ? Feeling very thirsty. ? Your eyes making fewer tears. ? Not sweating when body temperature is high, such as in hot weather. ? Your body making very little pee.  Changes in vital signs, such as: ? Weak pulse. ? Pulse that is more than 100 beats a minute when you are sitting still. ? Fast breathing. ? Low blood pressure.  Other changes,  such as: ? Sunken eyes. ? Cold hands and feet. ? Confusion. ? Lack of energy (lethargy). ? Trouble waking up from sleep. ? Short-term weight loss. ? Unconsciousness. Follow these instructions at home:  If told by your doctor, drink an ORS: ? Make an ORS by using instructions on the package. ? Start by drinking small amounts, about  cup (120 mL) every 5-10 minutes. ? Slowly drink more until you have had the amount that your doctor said to have.  Drink enough clear fluid to keep your pee clear or pale yellow. If you were told to drink an ORS, finish the ORS first, then start slowly drinking clear fluids. Drink fluids such as: ? Water. Do not drink only water by itself. Doing that can make the salt (sodium) level in your body get too low (hyponatremia). ? Ice chips. ? Fruit juice that you have added water to (diluted). ? Low-calorie sports drinks.  Avoid: ? Alcohol. ? Drinks that have a lot of sugar. These include high-calorie sports drinks, fruit juice that does not have water added, and soda. ? Caffeine. ? Foods that are greasy or have a lot of fat or sugar.  Take over-the-counter and prescription medicines only as told by your doctor.  Do not take salt tablets. Doing that can make the salt level in your body get too high (hypernatremia).  Eat foods that have minerals (electrolytes). Examples include bananas, oranges, potatoes, tomatoes, and spinach.  Keep all follow-up visits as told by your  doctor. This is important. Contact a doctor if:  You have belly (abdominal) pain that: ? Gets worse. ? Stays in one area (localizes).  You have a rash.  You have a stiff neck.  You get angry or annoyed more easily than normal (irritability).  You are more sleepy than normal.  You have a harder time waking up than normal.  You feel: ? Weak. ? Dizzy. ? Very thirsty.  You have peed (urinated) only a small amount of very dark pee during 6-8 hours. Get help right away  if:  You have symptoms of very bad dehydration.  You cannot drink fluids without throwing up (vomiting).  Your symptoms get worse with treatment.  You have a fever.  You have a very bad headache.  You are throwing up or having watery poop (diarrhea) and it: ? Gets worse. ? Does not go away.  You have blood or something green (bile) in your throw-up.  You have blood in your poop (stool). This may cause poop to look black and tarry.  You have not peed in 6-8 hours.  You pass out (faint).  Your heart rate when you are sitting still is more than 100 beats a minute.  You have trouble breathing. This information is not intended to replace advice given to you by your health care provider. Make sure you discuss any questions you have with your health care provider. Document Released: 11/02/2008 Document Revised: 07/27/2015 Document Reviewed: 03/02/2015 Elsevier Interactive Patient Education  2018 [employer].

## 2016-11-03 NOTE — Progress Notes (Signed)
Subjective:  Patient ID: Bonnie Mora, female    DOB: [birth year]  Age: 83 y.o. MRN: [id number]  CC: Urinary Tract Infection (reoccure uti/frequent urinate,odor,disoriented--going on for 4 wk. had prednison,cipro,levaquin,zpak,/augmentin--cause diarrhea?) and Cough (productive cough at night--had benzonatate--not helping--going on for 5 days. )  Accompanied by caregiver today HPI UTI: Diagnosed 5days ago Started on cipro and IM rocephin for UTI. Treated by urgent care provider in [place]. Symptoms have resolved.  Cough and SOB: Return to urgent care Diagnosed with pneumonia on Saturday. cipro changed to azithromycin and augmented on Saturday. Cough has improved but gets worse in bedtime. Developed diarrhea (loose stool, >5times a day, no blood).  Outpatient Medications Prior to Visit  Medication Sig Dispense Refill  . albuterol (PROVENTIL HFA;VENTOLIN HFA) 108 (90 Base) MCG/ACT inhaler Inhale 2 puffs into the lungs every 6 (six) hours as needed for wheezing or shortness of breath. 1 Inhaler 2  . ALPRAZolam (XANAX) 0.5 MG tablet Take 0.5-1 tablets (0.25-0.5 mg total) by mouth every 12 (twelve) hours as needed for anxiety. 14 tablet 0  . atorvastatin (LIPITOR) 20 MG tablet Take 1 tablet (20 mg total) by mouth daily at 6 PM. 90 tablet 3  . citalopram (CELEXA) 20 MG tablet Take 1 tablet (20 mg total) by mouth daily. 90 tablet 3  . meloxicam (MOBIC) 7.5 MG tablet Take 1 tablet (7.5 mg total) by mouth daily. 30 tablet 0  . metoprolol succinate (TOPROL-XL) 50 MG 24 hr tablet Take 1 tablet (50 mg total) by mouth daily with breakfast. 90 tablet 3  . traZODone (DESYREL) 100 MG tablet Take 1 tablet (100 mg total) by mouth at bedtime. 90 tablet 3   No facility-administered medications prior to visit.     ROS See HPI  Objective:  BP (!) 120/56   Pulse 73   Temp 98.5 F (36.9 C)   Ht  (1.549 m)   Wt 152 lb (68.9 kg)   SpO2 94%   BMI 28.72 kg/m   BP Readings from Last 3  Encounters:  11/03/16 (!) 120/56  08/19/16 (!) 134/56  04/25/16 (!) 160/110    Wt Readings from Last 3 Encounters:  11/03/16 152 lb (68.9 kg)  08/19/16 154 lb (69.9 kg)  04/25/16 146 lb (66.2 kg)    Physical Exam  Constitutional: No distress.  Cardiovascular: Normal rate.   Pulmonary/Chest: Effort normal and breath sounds normal.  Abdominal: She exhibits no distension. There is no tenderness.  Neurological: She is alert.  Oriented to self and caregiver.  Vitals reviewed.  Dg Chest 2 View  Result Date: 02/16/2015 CLINICAL DATA:  Recent diagnosis of pneumonia. EXAM: CHEST  2 VIEW COMPARISON:  01/07/2015 FINDINGS: Cardiomediastinal silhouette is normal. Mediastinal contours appear intact. There is no evidence of focal airspace consolidation, pleural effusion or pneumothorax. Calcific atherosclerotic disease of the aorta are is noted. Osseous structures are without acute abnormality. Soft tissues are grossly normal. IMPRESSION: No active cardiopulmonary disease. Calcified atherosclerotic disease of the aorta. Electronically Signed   By: Ted Mcalpine M.D.   On: 02/16/2015 13:11    Assessment & Plan:   Adaora was seen today for urinary tract infection and cough.  Diagnoses and all orders for this visit:  Cough -     DG Chest 2 View; Future -     promethazine-dextromethorphan (PROMETHAZINE-DM) 6.25-15 MG/5ML syrup; Take 5 mLs by mouth 3 (three) times daily as needed for cough.  SOB (shortness of breath) -     DG Chest 2  View; Future  Increased urinary frequency -     POCT urinalysis dipstick -     Urine Culture; Future  Disorientation -     POCT urinalysis dipstick  Diarrhea due to drug -     saccharomyces boulardii (FLORASTOR) 250 MG capsule; Take 1 capsule (250 mg total) by mouth 2 (two) times daily.   I am having Ms. Alberts start on saccharomyces boulardii and promethazine-dextromethorphan. I am also having her maintain her albuterol, traZODone, atorvastatin,  citalopram, metoprolol succinate, meloxicam, and ALPRAZolam.  Meds ordered this encounter  Medications  . saccharomyces boulardii (FLORASTOR) 250 MG capsule    Sig: Take 1 capsule (250 mg total) by mouth 2 (two) times daily.    Dispense:  30 capsule    Refill:  0    Order Specific Question:   Supervising Provider    Answer:   Tresa Garter [[id number]]  . promethazine-dextromethorphan (PROMETHAZINE-DM) 6.25-15 MG/5ML syrup    Sig: Take 5 mLs by mouth 3 (three) times daily as needed for cough.    Dispense:  120 mL    Refill:  0    Order Specific Question:   Supervising Provider    Answer:   Tresa Garter [[id number]]    Follow-up: Return if symptoms worsen or fail to improve.  Alysia Penna, NP

## 2016-11-04 LAB — URINE CULTURE
MICRO NUMBER: 81146494
RESULT: NO GROWTH
SPECIMEN QUALITY: ADEQUATE

## 2016-11-06 ENCOUNTER — Encounter: Payer: Self-pay | Admitting: Nurse Practitioner

## 2017-01-21 ENCOUNTER — Ambulatory Visit: Payer: Self-pay | Admitting: *Deleted

## 2017-01-21 NOTE — Telephone Encounter (Signed)
Pt's caregiver, Lanelle BalKimberly Mosher called in stating that the pt is having "frequent urination, talking out of her head, and up all night"; she describes the pt;s  urine as dark and strong smelling; pt triaged per nurse protocol ;   Reason for Disposition . Bad or foul-smelling urine  Answer Assessment - Initial Assessment Questions 1. SYMPTOM: "What's the main symptom you're concerned about?" (e.g., frequency, incontinence)     frequency 2. ONSET: "When did the  ________  start?"     1/2/191 3. PAIN: "Is there any pain?" If so, ask: "How bad is it?" (Scale: 1-10; mild, moderate, severe)     no 4. CAUSE: "What do you think is causing the symptoms?"     uti 5. OTHER SYMPTOMS: "Do you have any other symptoms?" (e.g., fever, flank pain, blood in urine, pain with urination)     Dark malodorous urine 6. PREGNANCY: "Is there any chance you are pregnant?" "When was your last menstrual period?"     n/a  Protocols used: [email or web address]

## 2017-03-06 ENCOUNTER — Emergency Department (HOSPITAL_COMMUNITY): Payer: Medicare Other

## 2017-03-06 ENCOUNTER — Encounter (HOSPITAL_COMMUNITY): Payer: Self-pay | Admitting: *Deleted

## 2017-03-06 ENCOUNTER — Inpatient Hospital Stay (HOSPITAL_COMMUNITY)
Admission: EM | Admit: 2017-03-06 | Discharge: 2017-03-10 | DRG: 064 | Disposition: A | Discharge status: Skilled Nursing Facility | Payer: Medicare Other | Attending: Internal Medicine | Admitting: Internal Medicine

## 2017-03-06 ENCOUNTER — Ambulatory Visit (HOSPITAL_COMMUNITY)
Admission: EM | Admit: 2017-03-06 | Discharge: 2017-03-06 | Disposition: A | Discharge status: Home / Self Care | Payer: Medicare Other

## 2017-03-06 ENCOUNTER — Other Ambulatory Visit: Payer: Self-pay

## 2017-03-06 DIAGNOSIS — Z79899 Other long term (current) drug therapy: Secondary | ICD-10-CM

## 2017-03-06 DIAGNOSIS — I63312 Cerebral infarction due to thrombosis of left middle cerebral artery: Secondary | ICD-10-CM

## 2017-03-06 DIAGNOSIS — Z1612 Extended spectrum beta lactamase (ESBL) resistance: Secondary | ICD-10-CM | POA: Diagnosis present

## 2017-03-06 DIAGNOSIS — G9341 Metabolic encephalopathy: Secondary | ICD-10-CM | POA: Diagnosis present

## 2017-03-06 DIAGNOSIS — R4182 Altered mental status, unspecified: Secondary | ICD-10-CM

## 2017-03-06 DIAGNOSIS — I6381 Other cerebral infarction due to occlusion or stenosis of small artery: Secondary | ICD-10-CM | POA: Diagnosis not present

## 2017-03-06 DIAGNOSIS — Z79891 Long term (current) use of opiate analgesic: Secondary | ICD-10-CM

## 2017-03-06 DIAGNOSIS — I110 Hypertensive heart disease with heart failure: Secondary | ICD-10-CM | POA: Diagnosis present

## 2017-03-06 DIAGNOSIS — F0390 Unspecified dementia without behavioral disturbance: Secondary | ICD-10-CM | POA: Diagnosis present

## 2017-03-06 DIAGNOSIS — Z885 Allergy status to narcotic agent status: Secondary | ICD-10-CM

## 2017-03-06 DIAGNOSIS — Z8673 Personal history of transient ischemic attack (TIA), and cerebral infarction without residual deficits: Secondary | ICD-10-CM

## 2017-03-06 DIAGNOSIS — B356 Tinea cruris: Secondary | ICD-10-CM | POA: Diagnosis present

## 2017-03-06 DIAGNOSIS — B9620 Unspecified Escherichia coli [E. coli] as the cause of diseases classified elsewhere: Secondary | ICD-10-CM | POA: Diagnosis present

## 2017-03-06 DIAGNOSIS — N390 Urinary tract infection, site not specified: Secondary | ICD-10-CM | POA: Diagnosis not present

## 2017-03-06 DIAGNOSIS — E785 Hyperlipidemia, unspecified: Secondary | ICD-10-CM | POA: Diagnosis present

## 2017-03-06 DIAGNOSIS — I7389 Other specified peripheral vascular diseases: Secondary | ICD-10-CM | POA: Diagnosis present

## 2017-03-06 DIAGNOSIS — Z66 Do not resuscitate: Secondary | ICD-10-CM | POA: Diagnosis present

## 2017-03-06 DIAGNOSIS — I509 Heart failure, unspecified: Secondary | ICD-10-CM | POA: Diagnosis present

## 2017-03-06 DIAGNOSIS — R29702 NIHSS score 2: Secondary | ICD-10-CM | POA: Diagnosis present

## 2017-03-06 DIAGNOSIS — G9340 Encephalopathy, unspecified: Secondary | ICD-10-CM | POA: Diagnosis present

## 2017-03-06 DIAGNOSIS — Z8249 Family history of ischemic heart disease and other diseases of the circulatory system: Secondary | ICD-10-CM

## 2017-03-06 DIAGNOSIS — G20 Parkinson's disease: Secondary | ICD-10-CM | POA: Diagnosis present

## 2017-03-06 DIAGNOSIS — I10 Essential (primary) hypertension: Secondary | ICD-10-CM | POA: Diagnosis present

## 2017-03-06 DIAGNOSIS — F419 Anxiety disorder, unspecified: Secondary | ICD-10-CM | POA: Diagnosis present

## 2017-03-06 DIAGNOSIS — I6330 Cerebral infarction due to thrombosis of unspecified cerebral artery: Secondary | ICD-10-CM

## 2017-03-06 DIAGNOSIS — Z96653 Presence of artificial knee joint, bilateral: Secondary | ICD-10-CM | POA: Diagnosis present

## 2017-03-06 LAB — CBC
HCT: 41.8 % (ref 36.0–46.0)
Hemoglobin: 13.2 g/dL (ref 12.0–15.0)
MCH: 29.5 pg (ref 26.0–34.0)
MCHC: 31.6 g/dL (ref 30.0–36.0)
MCV: 93.3 fL (ref 78.0–100.0)
PLATELETS: 221 10*3/uL (ref 150–400)
RBC: 4.48 MIL/uL (ref 3.87–5.11)
RDW: 14.1 % (ref 11.5–15.5)
WBC: 5.9 10*3/uL (ref 4.0–10.5)

## 2017-03-06 LAB — COMPREHENSIVE METABOLIC PANEL
ALK PHOS: 69 U/L (ref 38–126)
ALT: 18 U/L (ref 14–54)
AST: 25 U/L (ref 15–41)
Albumin: 3.5 g/dL (ref 3.5–5.0)
Anion gap: 13 (ref 5–15)
BILIRUBIN TOTAL: 0.7 mg/dL (ref 0.3–1.2)
BUN: 19 mg/dL (ref 6–20)
CALCIUM: 9.2 mg/dL (ref 8.9–10.3)
CO2: 22 mmol/L (ref 22–32)
CREATININE: 0.79 mg/dL (ref 0.44–1.00)
Chloride: 106 mmol/L (ref 101–111)
GFR calc Af Amer: >60 mL/min (ref >60)
Glucose, Bld: 169 mg/dL — ABNORMAL HIGH (ref 65–99)
Potassium: 4 mmol/L (ref 3.5–5.1)
Sodium: 141 mmol/L (ref 135–145)
TOTAL PROTEIN: 6.7 g/dL (ref 6.5–8.1)

## 2017-03-06 LAB — BASIC METABOLIC PANEL
BUN: 19 (ref 4–21)
Creatinine: 0.8 (ref 0.5–1.1)
GLUCOSE: 169
Potassium: 4 (ref 3.4–5.3)
SODIUM: 141 (ref 137–147)

## 2017-03-06 LAB — URINALYSIS, ROUTINE W REFLEX MICROSCOPIC
Bilirubin Urine: NEGATIVE
Glucose, UA: NEGATIVE mg/dL
HGB URINE DIPSTICK: NEGATIVE
Ketones, ur: NEGATIVE mg/dL
NITRITE: POSITIVE — AB
Protein, ur: NEGATIVE mg/dL
SPECIFIC GRAVITY, URINE: 1.024 (ref 1.005–1.030)
pH: 5 (ref 5.0–8.0)

## 2017-03-06 LAB — CBG MONITORING, ED: GLUCOSE-CAPILLARY: 140 mg/dL — AB (ref 65–99)

## 2017-03-06 MED ORDER — TRAZODONE HCL 100 MG PO TABS
100.0000 mg | ORAL_TABLET | Freq: Every day | ORAL | Status: DC
  Administered 2017-03-06 – 2017-03-09 (×4): 100 mg via ORAL
  Filled 2017-03-06 (×4): qty 1

## 2017-03-06 MED ORDER — LORAZEPAM 2 MG/ML IJ SOLN
1.0000 mg | Freq: Once | INTRAMUSCULAR | Status: AC
  Administered 2017-03-06: 1 mg via INTRAVENOUS
  Filled 2017-03-06: qty 1

## 2017-03-06 MED ORDER — ASPIRIN 300 MG RE SUPP: 300.0000 mg | Freq: Every day | RECTAL | Status: DC

## 2017-03-06 MED ORDER — CLOTRIMAZOLE 1 % EX CREA
TOPICAL_CREAM | Freq: Two times a day (BID) | CUTANEOUS | Status: DC
  Filled 2017-03-06: qty 15

## 2017-03-06 MED ORDER — ACETAMINOPHEN 325 MG PO TABS: 650.0000 mg | ORAL_TABLET | ORAL | Status: DC | PRN

## 2017-03-06 MED ORDER — METOPROLOL SUCCINATE ER 50 MG PO TB24
50.0000 mg | ORAL_TABLET | Freq: Every day | ORAL | Status: DC
  Administered 2017-03-08 – 2017-03-10 (×3): 50 mg via ORAL
  Filled 2017-03-06 (×5): qty 1

## 2017-03-06 MED ORDER — ENOXAPARIN SODIUM 40 MG/0.4ML ~~LOC~~ SOLN
40.0000 mg | SUBCUTANEOUS | Status: DC
  Administered 2017-03-07 – 2017-03-10 (×4): 40 mg via SUBCUTANEOUS
  Filled 2017-03-06 (×4): qty 0.4

## 2017-03-06 MED ORDER — SODIUM CHLORIDE 0.9 % IV SOLN
INTRAVENOUS | Status: AC
  Administered 2017-03-06 – 2017-03-07 (×2): via INTRAVENOUS

## 2017-03-06 MED ORDER — LORAZEPAM 1 MG PO TABS
1.0000 mg | ORAL_TABLET | Freq: Every day | ORAL | Status: DC | PRN
  Administered 2017-03-06 – 2017-03-10 (×4): 1 mg via ORAL
  Filled 2017-03-06 (×4): qty 1

## 2017-03-06 MED ORDER — ATORVASTATIN CALCIUM 20 MG PO TABS
20.0000 mg | ORAL_TABLET | Freq: Every day | ORAL | Status: DC
  Administered 2017-03-06 – 2017-03-09 (×4): 20 mg via ORAL
  Filled 2017-03-06 (×4): qty 1

## 2017-03-06 MED ORDER — ASPIRIN 325 MG PO TABS
325.0000 mg | ORAL_TABLET | Freq: Every day | ORAL | Status: DC
  Administered 2017-03-06 – 2017-03-10 (×5): 325 mg via ORAL
  Filled 2017-03-06 (×6): qty 1

## 2017-03-06 MED ORDER — ACETAMINOPHEN 160 MG/5ML PO SOLN: 650.0000 mg | ORAL | Status: DC | PRN

## 2017-03-06 MED ORDER — CITALOPRAM HYDROBROMIDE 20 MG PO TABS
20.0000 mg | ORAL_TABLET | Freq: Every day | ORAL | Status: DC
  Administered 2017-03-07 – 2017-03-10 (×4): 20 mg via ORAL
  Filled 2017-03-06: qty 1
  Filled 2017-03-06: qty 2
  Filled 2017-03-06 (×3): qty 1

## 2017-03-06 MED ORDER — ACETAMINOPHEN 650 MG RE SUPP: 650.0000 mg | RECTAL | Status: DC | PRN

## 2017-03-06 MED ORDER — ALBUTEROL SULFATE (2.5 MG/3ML) 0.083% IN NEBU: 3.0000 mL | INHALATION_SOLUTION | Freq: Four times a day (QID) | RESPIRATORY_TRACT | Status: DC | PRN

## 2017-03-06 MED ORDER — SODIUM CHLORIDE 0.9 % IV SOLN
1.0000 g | INTRAVENOUS | Status: DC
  Administered 2017-03-06: 1 g via INTRAVENOUS
  Filled 2017-03-06 (×2): qty 10

## 2017-03-06 NOTE — ED Provider Notes (Signed)
[facility] EMERGENCY DEPARTMENT Provider Note   CSN: [id number] Arrival date & time: 03/06/17  1443     History   Chief Complaint Chief Complaint  Patient presents with  . Altered Mental Status    HPI Bonnie Mora is a 84 y.o. female.  HPI   Bonnie Mora is an 84 year old female with a history of dementia, Parkinson's disease, hyperlipidemia, hypertension who presents to the emergency department for evaluation of altered mental status.  Level 5 caveat applies given patient's dementia.  Per caregiver at the bedside patient had difficulty getting her words out which occurred abruptly this afternoon. She was not making sense and crying uncontrollably. She had this in the past when she was diagnosed with a UTI. She lives with caretaker full time, is normally able to ambulate independently and follow commands. Caregiver denies recent illness. She has noticed that her urine is malodorous and she has a rash in her private area. Denies facial droop or slurred speech.   Past Medical History:  Diagnosis Date  . Dementia   . Hyperlipidemia   . Hypertension   . Parkinson disease [facility])     Patient Active Problem List   Diagnosis Date Noted  . Chronic left shoulder pain 08/19/2016  . Routine general medical examination at a health care facility 04/25/2016  . Diarrhea 11/03/2014  . Dementia 03/10/2014  . HLD (hyperlipidemia) 03/10/2014  . Essential hypertension 03/10/2014    Past Surgical History:  Procedure Laterality Date  . APPENDECTOMY    . JOINT REPLACEMENT     bilateral knee  . OVARIAN CYST SURGERY    . TONSILLECTOMY AND ADENOIDECTOMY      OB History    No data available       Home Medications    Prior to Admission medications   Medication Sig Start Date End Date Taking? Authorizing Provider  albuterol (PROVENTIL HFA;VENTOLIN HFA) 108 (90 Base) MCG/ACT inhaler Inhale 2 puffs into the lungs every 6 (six) hours as needed for wheezing or shortness of  breath. 05/14/15   Allegra Grana, FNP  ALPRAZolam Prudy Feeler) 0.5 MG tablet Take 0.5-1 tablets (0.25-0.5 mg total) by mouth every 12 (twelve) hours as needed for anxiety. 08/19/16   Nche, Bonna Gains, NP  atorvastatin (LIPITOR) 20 MG tablet Take 1 tablet (20 mg total) by mouth daily at 6 PM. 04/25/16   [name], MD  citalopram (CELEXA) 20 MG tablet Take 1 tablet (20 mg total) by mouth daily. 04/25/16   [name], MD  meloxicam (MOBIC) 7.5 MG tablet Take 1 tablet (7.5 mg total) by mouth daily. 08/19/16   Nche, Bonna Gains, NP  metoprolol succinate (TOPROL-XL) 50 MG 24 hr tablet Take 1 tablet (50 mg total) by mouth daily with breakfast. 04/25/16   [name], MD  promethazine-dextromethorphan (PROMETHAZINE-DM) 6.25-15 MG/5ML syrup Take 5 mLs by mouth 3 (three) times daily as needed for cough. 11/03/16   Nche, Bonna Gains, NP  saccharomyces boulardii (FLORASTOR) 250 MG capsule Take 1 capsule (250 mg total) by mouth 2 (two) times daily. 11/03/16   Nche, Bonna Gains, NP  traZODone (DESYREL) 100 MG tablet Take 1 tablet (100 mg total) by mouth at bedtime. 04/25/16   [name], MD    Family History Family History  Problem Relation Age of Onset  . ALS Mother   . Heart disease Father   . Heart disease Maternal Grandfather   . Cancer Maternal Grandfather  breast    Social History Social History   Tobacco Use  . Smoking status: Never Smoker  . Smokeless tobacco: Never Used  Substance Use Topics  . Alcohol use: No    Alcohol/week: 0.0 oz  . Drug use: No     Allergies   Codeine   Review of Systems Review of Systems  Unable to perform ROS: Dementia     Physical Exam Updated Vital Signs BP 127/81 (BP Location: Right Arm)   Pulse 71   Temp 98.4 F (36.9 C) (Oral)   Resp 16   Ht 5' (1.524 m)   Wt 67.1 kg (148 lb)   SpO2 96%   BMI 28.90 kg/m   Physical Exam  Constitutional: She appears well-developed and well-nourished. No  distress.  Patient yelling out "let me leave" repeatedly with crying. Appears uncomfortable.   HENT:  Head: Normocephalic and atraumatic.  Mouth/Throat: Oropharynx is clear and moist. No oropharyngeal exudate.  Eyes: Conjunctivae are normal. Pupils are equal, round, and reactive to light. Right eye exhibits no discharge. Left eye exhibits no discharge.  Neck: Normal range of motion. Neck supple. No JVD present. No tracheal deviation present.  Cardiovascular: Normal rate, regular rhythm and intact distal pulses. Exam reveals no friction rub.  No murmur heard. Pulmonary/Chest: Effort normal and breath sounds normal. No stridor. No respiratory distress. She has no wheezes. She has no rales.  Abdominal: Soft. Bowel sounds are normal. There is no tenderness. There is no guarding.  Musculoskeletal: Normal range of motion.  Lymphadenopathy:    She has no cervical adenopathy.  Neurological: She is alert. Coordination normal.  Oriented to person and place. Disoriented to time. Speech without evidence of aphasia. Able to follow one step commands. Cranial Nerves:  II:  Pupils equal, round, reactive to light III,IV, VI: ptosis not present, extra-ocular motions appear grossly intact with patient tracking VII: smile symmetric VIII: hearing grossly normal to voice  X: uvula elevates symmetrically  XI: bilateral shoulder shrug symmetric and strong XII: midline tongue extension without fassiculations Motor:  Normal tone. 5/5 in upper and lower extremities bilaterally including strong and equal grip strength and dorsiflexion/plantar flexion Sensory: Pinprick and light touch normal in all extremities.  Gait: needs two person assistance to ambulate  Skin: Skin is warm and dry. She is not diaphoretic.  Erythematous, well demarcated rash in the inguinal folds which spreads to bilateral labia majora. Has central clearing.   Psychiatric: She has a normal mood and affect. Her behavior is normal.  Nursing note  and vitals reviewed.    ED Treatments / Results  Labs (all labs ordered are listed, but only abnormal results are displayed) Labs Reviewed  COMPREHENSIVE METABOLIC PANEL - Abnormal; Notable for the following components:      Result Value   Glucose, Bld 169 (*)    All other components within normal limits  URINALYSIS, ROUTINE W REFLEX MICROSCOPIC - Abnormal; Notable for the following components:   APPearance HAZY (*)    Nitrite POSITIVE (*)    Leukocytes, UA SMALL (*)    Bacteria, UA RARE (*)    Squamous Epithelial / LPF 0-5 (*)    All other components within normal limits  CBG MONITORING, ED - Abnormal; Notable for the following components:   Glucose-Capillary 140 (*)    All other components within normal limits  URINE CULTURE  URINE CULTURE  CBC  CBC  CREATININE, SERUM    EKG  EKG Interpretation  Date/Time:  Friday March 06 2017  15:26:10 EST Ventricular Rate:  67 PR Interval:    QRS Duration: 88 QT Interval:  404 QTC Calculation: 427 R Axis:   36 Text Interpretation:  Sinus rhythm Confirmed by Benjiman CorePickering, Nathan [phone]([id number]) on 03/06/2017 4:19:51 PM       Radiology Dg Chest 2 View  Result Date: 03/06/2017 CLINICAL DATA:  Altered mental status. EXAM: CHEST  2 VIEW COMPARISON:  Chest x-ray dated January 21, 2017. FINDINGS: The heart size and mediastinal contours are within normal limits. Normal pulmonary vascularity. Atherosclerotic calcification of the aortic arch. Left basilar atelectasis/scarring in noted. No focal consolidation, pleural effusion, or pneumothorax. No acute osseous abnormality. Unchanged eventration of the right hemidiaphragm. IMPRESSION: No active cardiopulmonary disease. Electronically Signed   By: Obie DredgeWilliam T Derry M.D.   On: 03/06/2017 16:50   Ct Head Wo Contrast  Result Date: 03/06/2017 CLINICAL DATA:  Confusion. EXAM: CT HEAD WITHOUT CONTRAST TECHNIQUE: Contiguous axial images were obtained from the base of the skull through the vertex without  intravenous contrast. COMPARISON:  03/16/2009 FINDINGS: Brain: There is motion artifact predominantly through the skull base. Encephalomalacia in the anterior right frontal lobe appears to have mildly progressed from 2011 and may be posttraumatic or postischemic, with motion artifact through this region mildly limiting assessment. No definite acute large territory infarct, intracranial hemorrhage, mass, midline shift, or extra-axial fluid collection is identified. Periventricular white matter hypodensities of progressed and are nonspecific but compatible with moderate chronic small vessel ischemic disease. There is moderate cerebral atrophy. Vascular: Calcified atherosclerosis at the skull base. No hyperdense vessel. Skull: No fracture or suspicious osseous lesion. Sinuses/Orbits: Visualized paranasal sinuses and mastoid air cells are grossly clear. No gross acute abnormality in the included orbits. Other: None. IMPRESSION: 1. Motion degraded examination without definite acute intracranial abnormality identified. 2. Suspected progressive encephalomalacia in the anterior right frontal lobe. 3. Progressive, moderate chronic small vessel ischemic disease and cerebral atrophy. Electronically Signed   By: Sebastian AcheAllen  Grady M.D.   On: 03/06/2017 18:04    Procedures Procedures (including critical care time)  Medications Ordered in ED   Initial Impression / Assessment and Plan / ED Course  I have reviewed the triage vital signs and the nursing notes.  Pertinent labs & imaging results that were available during my care of the patient were reviewed by me and considered in my medical decision making (see chart for details).  Presents with acute onset AMS. On exam she is shouting and crying. She is afebrile and VSS. No focal neurological deficits on exam.   Labs reviewed. CBC and CMP unremarkable. UA positive nitrites, small leukocytes and rare bacteria. Borderline for infection. Will send for culture. CXR negative  for acute abnormality. EKG sinus rhythm, non-ischemic.  Concern for acute stroke given sudden onset of symptoms. CT head ordered.   CT head without acute intracranial abnormality. On recheck patient continues to be confused. Discussed this patient with Dr. Rubin PayorPickering who agrees with admission for AMS due to UTI vs acute stroke.   She also has what appears to be fungal inguinal rash. Clotrimazole cream started in the ED.   Consulted Dr. Toniann FailKakrakandy who will admit patient. Family informed.   Final Clinical Impressions(s) / ED Diagnoses   Final diagnoses:  Altered mental status, unspecified altered mental status type    ED Discharge Orders    None       Lawrence MarseillesShrosbree, Cyntia Staley J, PA-C 03/06/17 2212    Benjiman CorePickering, Nathan, MD 03/06/17 2240

## 2017-03-06 NOTE — ED Notes (Signed)
PA at the bedside.

## 2017-03-06 NOTE — ED Notes (Signed)
Pt returned from MRI without having scan performed. Per MRI, pt was unable to stay on her back to have the scan performed. ED PA made aware.

## 2017-03-06 NOTE — ED Notes (Signed)
Spoke with patients daughter, pt had a sudden onset of confusion today, worried about a UTI. Pt very confused, keeps stuttering "take me home". Spoke with daughter, pt daughter decided to take pt to ER. This nurse walked family down to ER

## 2017-03-06 NOTE — ED Notes (Signed)
Patient transported to CT 

## 2017-03-06 NOTE — Progress Notes (Signed)
Pharmacy Antibiotic Note  Bonnie Mora is a 84 y.o. female admitted on 03/06/2017 with sudden onset of confusion concerning for UTI.  Pharmacy has been consulted for Rocephin dosing.  Plan: 1. Start Rocephin 1g IV every 24 hours 2. Pharmacy will sign off as no additional dose adjustments expected at this time  Height: 5' (152.4 cm) Weight: 148 lb (67.1 kg) IBW/kg (Calculated) : 45.5  Temp (24hrs), Avg:98.4 F (36.9 C), Min:98.4 F (36.9 C), Max:98.4 F (36.9 C)  Recent Labs  Lab 03/06/17 1505  WBC 5.9  CREATININE 0.79    Estimated Creatinine Clearance: 41.5 mL/min (by C-G formula based on SCr of 0.79 mg/dL).    Allergies  Allergen Reactions  . Codeine Nausea And Vomiting     Thank you for allowing pharmacy to be a part of this patient's care.  Rolley SimsMartin, Felipe Paluch Ann 03/06/2017 8:49 PM

## 2017-03-06 NOTE — ED Triage Notes (Addendum)
Pt in from home where she lives with her caregiver, pts caregiver reports confusion, pt hx of dementia, pt reported to have trouble getting her words out, per visitor pt had similar symptoms when she had a uti, last uti in December, pt alert, no slurred speech, pt has Parkinsons dz, neg van, follows commands, acute onset today @ noon

## 2017-03-06 NOTE — H&P (Signed)
History and Physical    Bonnie NorthDorothy J Mora JXB:[id number] DOB: 1926-11-27 DOA: 03/06/2017  PCP: Bonnie Brokerrawford, Elizabeth A, MD  Patient coming from: Home.  Chief Complaint: Confusion.  History provided by patient's caregiver.  HPI: Bonnie Mora is Mora 84 y.o. female with history of advanced dementia hypertension anxiety was brought to the ER after patient was found to be increasingly confused.  Patient's confusion started around 12 noon.  Prior to that patient was at baseline.  Patient also became agitated.  As per the caregiver patient did not have any nausea vomiting abdominal did not have any chest pain or shortness of breath.  Gets confused as to when she gets UTI.  ED Course: In the ER patient had to be given Ativan for agitation.  CT head was unremarkable.  UA shows features concerning for UTI.  Since patient is very agitated patient is being admitted for further observation.  Review of Systems: As per HPI, rest all negative.   Past Medical History:  Diagnosis Date  . Dementia   . Hyperlipidemia   . Hypertension   . Parkinson disease [facility](HCC)     Past Surgical History:  Procedure Laterality Date  . APPENDECTOMY    . JOINT REPLACEMENT     bilateral knee  . OVARIAN CYST SURGERY    . TONSILLECTOMY AND ADENOIDECTOMY       reports that  has never smoked. she has never used smokeless tobacco. She reports that she does not drink alcohol or use drugs.  Allergies  Allergen Reactions  . Codeine Nausea And Vomiting    Family History  Problem Relation Age of Onset  . ALS Mother   . Heart disease Father   . Heart disease Maternal Grandfather   . Cancer Maternal Grandfather        breast    Prior to Admission medications   Medication Sig Start Date End Date Taking? Authorizing Provider  albuterol (PROVENTIL HFA;VENTOLIN HFA) 108 (90 Base) MCG/ACT inhaler Inhale 2 puffs into the lungs every 6 (six) hours as needed for wheezing or shortness of breath. 05/14/15  Yes Allegra GranaArnett, Margaret G,  FNP  atorvastatin (LIPITOR) 20 MG tablet Take 1 tablet (20 mg total) by mouth daily at 6 PM. Patient taking differently: Take 20 mg by mouth at bedtime.  04/25/16  Yes Bonnie Brokerrawford, Elizabeth A, MD  citalopram (CELEXA) 20 MG tablet Take 1 tablet (20 mg total) by mouth daily. 04/25/16  Yes Bonnie Brokerrawford, Elizabeth A, MD  LORazepam (ATIVAN) 1 MG tablet Take 1 mg by mouth daily as needed. 01/21/17  Yes [provider]  metoprolol succinate (TOPROL-XL) 50 MG 24 hr tablet Take 1 tablet (50 mg total) by mouth daily with breakfast. 04/25/16  Yes Bonnie Brokerrawford, Elizabeth A, MD  mupirocin ointment (BACTROBAN) 2 % Place 1 application into the nose daily as needed (wound care).   Yes [provider]  traZODone (DESYREL) 100 MG tablet Take 1 tablet (100 mg total) by mouth at bedtime. 04/25/16  Yes Bonnie Brokerrawford, Elizabeth A, MD  ALPRAZolam Prudy Feeler(XANAX) 0.5 MG tablet Take 0.5-1 tablets (0.25-0.5 mg total) by mouth every 12 (twelve) hours as needed for anxiety. Patient not taking: Reported on 03/06/2017 08/19/16   Nche, Bonna Gainsharlotte Lum, NP  promethazine-dextromethorphan (PROMETHAZINE-DM) 6.25-15 MG/5ML syrup Take 5 mLs by mouth 3 (three) times daily as needed for cough. Patient not taking: Reported on 03/06/2017 11/03/16   Nche, Bonna Gainsharlotte Lum, NP  saccharomyces boulardii (FLORASTOR) 250 MG capsule Take 1 capsule (250 mg total) by mouth 2 (  two) times daily. Patient not taking: Reported on 03/06/2017 11/03/16   Anne Ng, NP    Physical Exam: Vitals:   03/06/17 1713 03/06/17 1730 03/06/17 1800 03/06/17 1900  BP: (!) 151/85 (!) 183/90 (!) 155/88 (!) 164/65  Pulse: 64 60 62 (!) 55  Resp: 19 18  18   Temp:      TempSrc:      SpO2: 99% 96% 97% 93%  Weight:      Height:          Constitutional: Moderately built and nourished. Vitals:   03/06/17 1713 03/06/17 1730 03/06/17 1800 03/06/17 1900  BP: (!) 151/85 (!) 183/90 (!) 155/88 (!) 164/65  Pulse: 64 60 62 (!) 55  Resp: 19 18  18   Temp:      TempSrc:      SpO2:  99% 96% 97% 93%  Weight:      Height:       Eyes: Anicteric no pallor. ENMT: No discharge from the ears eyes nose or mouth. Neck: No mass felt.  No neck rigidity.  No JVD appreciated.  Respiratory: No rhonchi or crepitations. Cardiovascular: S1-S2 heard no murmurs appreciated. Abdomen: Soft nontender bowel sounds present. Musculoskeletal: No edema.  No joint effusion. Skin: No rash. Neurologic: Alert awake appears confused.  Does not follow commands.  Pupils are reacting to light.  No facial asymmetry. Psychiatric: Appears confused.   Labs on Admission: I have personally reviewed following labs and imaging studies  CBC: Recent Labs  Lab 03/06/17 1505  WBC 5.9  HGB 13.2  HCT 41.8  MCV 93.3  PLT 221   Basic Metabolic Panel: Recent Labs  Lab 03/06/17 1505  NA 141  K 4.0  CL 106  CO2 22  GLUCOSE 169*  BUN 19  CREATININE 0.79  CALCIUM 9.2   GFR: Estimated Creatinine Clearance: 41.5 mL/min (by C-G formula based on SCr of 0.79 mg/dL). Liver Function Tests: Recent Labs  Lab 03/06/17 1505  AST 25  ALT 18  ALKPHOS 69  BILITOT 0.7  PROT 6.7  ALBUMIN 3.5   No results for input(s): LIPASE, AMYLASE in the last 168 hours. No results for input(s): AMMONIA in the last 168 hours. Coagulation Profile: No results for input(s): INR, PROTIME in the last 168 hours. Cardiac Enzymes: No results for input(s): CKTOTAL, CKMB, CKMBINDEX, TROPONINI in the last 168 hours. BNP (last 3 results) No results for input(s): PROBNP in the last 8760 hours. HbA1C: No results for input(s): HGBA1C in the last 72 hours. CBG: Recent Labs  Lab 03/06/17 1531  GLUCAP 140*   Lipid Profile: No results for input(s): CHOL, HDL, LDLCALC, TRIG, CHOLHDL, LDLDIRECT in the last 72 hours. Thyroid Function Tests: No results for input(s): TSH, T4TOTAL, FREET4, T3FREE, THYROIDAB in the last 72 hours. Anemia Panel: No results for input(s): VITAMINB12, FOLATE, FERRITIN, TIBC, IRON, RETICCTPCT in the last  72 hours. Urine analysis:    Component Value Date/Time   COLORURINE YELLOW 03/06/2017 1618   APPEARANCEUR HAZY (Mora) 03/06/2017 1618   LABSPEC 1.024 03/06/2017 1618   PHURINE 5.0 03/06/2017 1618   GLUCOSEU NEGATIVE 03/06/2017 1618   HGBUR NEGATIVE 03/06/2017 1618   BILIRUBINUR NEGATIVE 03/06/2017 1618   BILIRUBINUR neg 11/03/2016 1634   KETONESUR NEGATIVE 03/06/2017 1618   PROTEINUR NEGATIVE 03/06/2017 1618   UROBILINOGEN 0.2 11/03/2016 1634   UROBILINOGEN 0.2 03/10/2014 0931   NITRITE POSITIVE (Mora) 03/06/2017 1618   LEUKOCYTESUR SMALL (Mora) 03/06/2017 1618   Sepsis Labs: @LABRCNTIP (procalcitonin:4,lacticidven:4) )No results found for this  or any previous visit (from the past 240 hour(s)).   Radiological Exams on Admission: Dg Chest 2 View  Result Date: 03/06/2017 CLINICAL DATA:  Altered mental status. EXAM: CHEST  2 VIEW COMPARISON:  Chest x-ray dated January 21, 2017. FINDINGS: The heart size and mediastinal contours are within normal limits. Normal pulmonary vascularity. Atherosclerotic calcification of the aortic arch. Left basilar atelectasis/scarring in noted. No focal consolidation, pleural effusion, or pneumothorax. No acute osseous abnormality. Unchanged eventration of the right hemidiaphragm. IMPRESSION: No active cardiopulmonary disease. Electronically Signed   By: Obie Dredge M.D.   On: 03/06/2017 16:50   Ct Head Wo Contrast  Result Date: 03/06/2017 CLINICAL DATA:  Confusion. EXAM: CT HEAD WITHOUT CONTRAST TECHNIQUE: Contiguous axial images were obtained from the base of the skull through the vertex without intravenous contrast. COMPARISON:  03/16/2009 FINDINGS: Brain: There is motion artifact predominantly through the skull base. Encephalomalacia in the anterior right frontal lobe appears to have mildly progressed from 2011 and may be posttraumatic or postischemic, with motion artifact through this region mildly limiting assessment. No definite acute large territory infarct,  intracranial hemorrhage, mass, midline shift, or extra-axial fluid collection is identified. Periventricular white matter hypodensities of progressed and are nonspecific but compatible with moderate chronic small vessel ischemic disease. There is moderate cerebral atrophy. Vascular: Calcified atherosclerosis at the skull base. No hyperdense vessel. Skull: No fracture or suspicious osseous lesion. Sinuses/Orbits: Visualized paranasal sinuses and mastoid air cells are grossly clear. No gross acute abnormality in the included orbits. Other: None. IMPRESSION: 1. Motion degraded examination without definite acute intracranial abnormality identified. 2. Suspected progressive encephalomalacia in the anterior right frontal lobe. 3. Progressive, moderate chronic small vessel ischemic disease and cerebral atrophy. Electronically Signed   By: Sebastian Ache M.D.   On: 03/06/2017 18:04    EKG: Independently reviewed.  Normal sinus rhythm.  Assessment/Plan Principal Problem:   Acute encephalopathy Active Problems:   Dementia   HLD (hyperlipidemia)   Essential hypertension   Acute lower UTI    1. Acute encephalopathy -likely from UTI.  However since patient symptoms started acutely MRI brain was ordered.  Patient is on ceftriaxone follow cultures.  Patient did receive IV Ativan for agitation may need further if there is any further agitation. 2. Hypertension uncontrolled on metoprolol.  If stroke is ruled out may need IV hydralazine. 3. History of dementia. 4. History of anxiety on alprazolam.   DVT prophylaxis: Lovenox. Code Status: DNR. Family Communication: No family at the bedside.  Patient's caregiver present. Disposition Plan: Back to home. Consults called: None. Admission status: Observation.   Eduard Clos MD [facility] Pager [phone].  If 7PM-7AM, please contact night-coverage [email or web address] Password [facility]  03/06/2017, 8:49 PM

## 2017-03-07 ENCOUNTER — Other Ambulatory Visit: Payer: Self-pay

## 2017-03-07 DIAGNOSIS — I110 Hypertensive heart disease with heart failure: Secondary | ICD-10-CM | POA: Diagnosis present

## 2017-03-07 DIAGNOSIS — G20 Parkinson's disease: Secondary | ICD-10-CM | POA: Diagnosis present

## 2017-03-07 DIAGNOSIS — Z66 Do not resuscitate: Secondary | ICD-10-CM | POA: Diagnosis present

## 2017-03-07 DIAGNOSIS — F0280 Dementia in other diseases classified elsewhere without behavioral disturbance: Secondary | ICD-10-CM | POA: Diagnosis not present

## 2017-03-07 DIAGNOSIS — B356 Tinea cruris: Secondary | ICD-10-CM | POA: Diagnosis present

## 2017-03-07 DIAGNOSIS — Z96653 Presence of artificial knee joint, bilateral: Secondary | ICD-10-CM | POA: Diagnosis present

## 2017-03-07 DIAGNOSIS — Z79899 Other long term (current) drug therapy: Secondary | ICD-10-CM | POA: Diagnosis not present

## 2017-03-07 DIAGNOSIS — N390 Urinary tract infection, site not specified: Secondary | ICD-10-CM | POA: Diagnosis present

## 2017-03-07 DIAGNOSIS — Z8249 Family history of ischemic heart disease and other diseases of the circulatory system: Secondary | ICD-10-CM | POA: Diagnosis not present

## 2017-03-07 DIAGNOSIS — Z79891 Long term (current) use of opiate analgesic: Secondary | ICD-10-CM | POA: Diagnosis not present

## 2017-03-07 DIAGNOSIS — I10 Essential (primary) hypertension: Secondary | ICD-10-CM

## 2017-03-07 DIAGNOSIS — I639 Cerebral infarction, unspecified: Secondary | ICD-10-CM | POA: Diagnosis not present

## 2017-03-07 DIAGNOSIS — Z1612 Extended spectrum beta lactamase (ESBL) resistance: Secondary | ICD-10-CM | POA: Diagnosis present

## 2017-03-07 DIAGNOSIS — I6381 Other cerebral infarction due to occlusion or stenosis of small artery: Secondary | ICD-10-CM | POA: Diagnosis present

## 2017-03-07 DIAGNOSIS — E785 Hyperlipidemia, unspecified: Secondary | ICD-10-CM | POA: Diagnosis present

## 2017-03-07 DIAGNOSIS — I7389 Other specified peripheral vascular diseases: Secondary | ICD-10-CM | POA: Diagnosis present

## 2017-03-07 DIAGNOSIS — G9341 Metabolic encephalopathy: Secondary | ICD-10-CM | POA: Diagnosis present

## 2017-03-07 DIAGNOSIS — I509 Heart failure, unspecified: Secondary | ICD-10-CM | POA: Diagnosis present

## 2017-03-07 DIAGNOSIS — G301 Alzheimer's disease with late onset: Secondary | ICD-10-CM | POA: Diagnosis not present

## 2017-03-07 DIAGNOSIS — Z885 Allergy status to narcotic agent status: Secondary | ICD-10-CM | POA: Diagnosis not present

## 2017-03-07 DIAGNOSIS — G9340 Encephalopathy, unspecified: Secondary | ICD-10-CM | POA: Diagnosis not present

## 2017-03-07 DIAGNOSIS — F0390 Unspecified dementia without behavioral disturbance: Secondary | ICD-10-CM | POA: Diagnosis present

## 2017-03-07 DIAGNOSIS — R29702 NIHSS score 2: Secondary | ICD-10-CM | POA: Diagnosis present

## 2017-03-07 DIAGNOSIS — F0281 Dementia in other diseases classified elsewhere with behavioral disturbance: Secondary | ICD-10-CM

## 2017-03-07 DIAGNOSIS — F419 Anxiety disorder, unspecified: Secondary | ICD-10-CM | POA: Diagnosis present

## 2017-03-07 DIAGNOSIS — A499 Bacterial infection, unspecified: Secondary | ICD-10-CM | POA: Diagnosis not present

## 2017-03-07 DIAGNOSIS — B9620 Unspecified Escherichia coli [E. coli] as the cause of diseases classified elsewhere: Secondary | ICD-10-CM | POA: Diagnosis present

## 2017-03-07 DIAGNOSIS — G309 Alzheimer's disease, unspecified: Secondary | ICD-10-CM | POA: Diagnosis not present

## 2017-03-07 DIAGNOSIS — R4182 Altered mental status, unspecified: Secondary | ICD-10-CM | POA: Diagnosis present

## 2017-03-07 DIAGNOSIS — I6330 Cerebral infarction due to thrombosis of unspecified cerebral artery: Secondary | ICD-10-CM | POA: Diagnosis not present

## 2017-03-07 LAB — CBC WITH DIFFERENTIAL/PLATELET
BASOS ABS: 0 10*3/uL (ref 0.0–0.1)
Basophils Relative: 1 %
EOS PCT: 2 %
Eosinophils Absolute: 0.1 10*3/uL (ref 0.0–0.7)
HCT: 37.6 % (ref 36.0–46.0)
Hemoglobin: 11.8 g/dL — ABNORMAL LOW (ref 12.0–15.0)
LYMPHS ABS: 1.7 10*3/uL (ref 0.7–4.0)
LYMPHS PCT: 29 %
MCH: 29.4 pg (ref 26.0–34.0)
MCHC: 31.4 g/dL (ref 30.0–36.0)
MCV: 93.5 fL (ref 78.0–100.0)
MONO ABS: 0.5 10*3/uL (ref 0.1–1.0)
Monocytes Relative: 8 %
Neutro Abs: 3.7 10*3/uL (ref 1.7–7.7)
Neutrophils Relative %: 60 %
Platelets: 207 10*3/uL (ref 150–400)
RBC: 4.02 MIL/uL (ref 3.87–5.11)
RDW: 14.2 % (ref 11.5–15.5)
WBC: 6 10*3/uL (ref 4.0–10.5)

## 2017-03-07 LAB — COMPREHENSIVE METABOLIC PANEL
ALT: 15 U/L (ref 14–54)
AST: 21 U/L (ref 15–41)
Albumin: 3.1 g/dL — ABNORMAL LOW (ref 3.5–5.0)
Alkaline Phosphatase: 66 U/L (ref 38–126)
Anion gap: 10 (ref 5–15)
BUN: 15 mg/dL (ref 6–20)
CO2: 24 mmol/L (ref 22–32)
Calcium: 8.9 mg/dL (ref 8.9–10.3)
Chloride: 106 mmol/L (ref 101–111)
Creatinine, Ser: 0.74 mg/dL (ref 0.44–1.00)
Glucose, Bld: 108 mg/dL — ABNORMAL HIGH (ref 65–99)
POTASSIUM: 4 mmol/L (ref 3.5–5.1)
Sodium: 140 mmol/L (ref 135–145)
Total Bilirubin: 0.6 mg/dL (ref 0.3–1.2)
Total Protein: 6.1 g/dL — ABNORMAL LOW (ref 6.5–8.1)

## 2017-03-07 LAB — MAGNESIUM: Magnesium: 1.9 mg/dL (ref 1.7–2.4)

## 2017-03-07 MED ORDER — LORAZEPAM 2 MG/ML IJ SOLN: 1.0000 mg | Freq: Once | INTRAMUSCULAR | Status: DC

## 2017-03-07 NOTE — Progress Notes (Signed)
Pt arrived to floor via stretcher. Alert and Oriented to self only. Accompanied by caregiver at bedside. History obtained from caregiver, Cala BradfordKimberly.  Oriented to room and call light. Educated on plan of care.  Pt declined safety video at this time.  Denies questions at this time.

## 2017-03-07 NOTE — Evaluation (Signed)
Occupational Therapy Evaluation Patient Details Name: Bonnie Mora MRN: [id number] DOB: 12-13-1926 Today's Date: 03/07/2017    History of Present Illness (P) Pt is an 84 y.o. female with history of advanced dementia, hypertension,a nd anxiety who was brought to the ED after being found to be increasingly confused and agitated. Admitted with acute probable metabolic encephalopathy likely from UTI and started on antibiotics. CT revealed suspected progressive encephalomalacia in the anterior right frontal lobe. MRI pending.    Clinical Impression   PTA, pt was living with caregivers who were providing 24 hour assistance with functional mobility and ADL participation. She is able to self-feed with supervision. She has a history of dementia and did demonstrate significant confusion and emotional lability throughout session. Pt able to complete LB ADL with min assist, toilet transfers with mod assist utilizing RW, and seated grooming tasks with min assist this session. She benefits from step-by-step VC's to sequence ADL tasks. At current functional level, recommend SNF level rehabilitation post-acute D/C. Per discussion with daughter, pt's family is interested in seeking higher level of care for pt post-acute D/C as well.     Follow Up Recommendations  SNF;Supervision/Assistance - 24 hour    Equipment Recommendations  Other (comment)(defer to next venue of care)    Recommendations for Other Services       Precautions / Restrictions Restrictions Weight Bearing Restrictions: (P) No      Mobility Bed Mobility Overal bed mobility: (P) Needs Assistance Bed Mobility: (P) Supine to Sit;Sit to Supine     Supine to sit: (P) Min guard Sit to supine: (P) Min guard   General bed mobility comments: (P) Cues to sequence task but no physical assistance required. Min guard assist for safety.   Transfers Overall transfer level: (P) Needs assistance Equipment used: (P) Rolling walker (2  wheeled) Transfers: (P) Sit to/from Stand Sit to Stand: (P) Min assist         General transfer comment: (P) Min assist to power up into standing position with support from therapist and RW.     Balance Overall balance assessment: (P) Needs assistance Sitting-balance support: (P) No upper extremity supported;Feet supported Sitting balance-Leahy Scale: (P) Fair     Standing balance support: (P) During functional activity;Bilateral upper extremity supported Standing balance-Leahy Scale: (P) Poor Standing balance comment: (P) Relies on B UE support and min assist from therapist to maintain balance.                            ADL either performed or assessed with clinical judgement   ADL Overall ADL's : Needs assistance/impaired Eating/Feeding: Set up;Supervision/ safety;Sitting   Grooming: Sitting;Minimal assistance Grooming Details (indicate cue type and reason): Pt with poor ability to plan and coordinate swish and spit after oral care, attempting to swallow water rather than rinse mouth out. Required assistance to open toothpaste container and step-by-step cues.  Upper Body Bathing: Minimal assistance;Sitting   Lower Body Bathing: Minimal assistance;Sit to/from stand Lower Body Bathing Details (indicate cue type and reason): For sit to stand Upper Body Dressing : Minimal assistance;Sitting   Lower Body Dressing: Minimal assistance;Sit to/from stand Lower Body Dressing Details (indicate cue type and reason): For sit<>stand Toilet Transfer: Minimal assistance;Moderate assistance;Ambulation;RW Toilet Transfer Details (indicate cue type and reason): Min assist when ambulating to window but mod on return to bed when taking backward steps. Difficulty coordinating use of RW.  Toileting- Clothing Manipulation and Hygiene: Sit to/from stand;Minimal assistance  Functional mobility during ADLs: Minimal assistance;Moderate assistance;Rolling walker General ADL Comments:  Difficulty coordinating use of RW as this is a new concept. Able to stand with min assist.      Vision   Additional Comments: able to track with therapist and make eye contact     Perception     Praxis      Pertinent Vitals/Pain Pain Assessment: No/denies pain     Hand Dominance     Extremity/Trunk Assessment Upper Extremity Assessment Upper Extremity Assessment: Generalized weakness   Lower Extremity Assessment Lower Extremity Assessment: Generalized weakness       Communication     Cognition Arousal/Alertness: (P) Awake/alert Behavior During Therapy: (P) (emotionally labile; smiling/laughing to crying in moments) Overall Cognitive Status: (P) History of cognitive impairments - at baseline Area of Impairment: (P) Orientation;Attention;Memory;Following commands;Safety/judgement;Awareness;Problem solving                 Orientation Level: (P) Disoriented to;Place;Time;Situation Current Attention Level: (P) Sustained Memory: (P) Decreased recall of precautions;Decreased short-term memory Following Commands: (P) Follows one step commands consistently;Follows one step commands with increased time Safety/Judgement: (P) Decreased awareness of safety;Decreased awareness of deficits Awareness: (P) Intellectual Problem Solving: (P) Slow processing;Decreased initiation;Difficulty sequencing;Requires verbal cues;Requires tactile cues General Comments: (P) Pt with history of advanced dementia. Pleasant but easily upset due to not being at home. Asking where her daughter was when daughter seated in front of her.    General Comments  Daughter, Elita Quick present and engaged in session.     Exercises     Shoulder Instructions      Home Living Family/patient expects to be discharged to:: (P) Skilled nursing facility Living Arrangements: (P) Non-relatives/Friends(24 hour caregivers)                               Additional Comments: (P) Lives with 24 hour caregivers.  Per daughter, planning to D/C to higher level of care.       Prior Functioning/Environment Level of Independence: (P) Needs assistance  Gait / Transfers Assistance Needed: (P) Has RW but does not use. Requires hands on assist for all mobility.  ADL's / Homemaking Assistance Needed: (P) Requires assistance for all tasks due to cognitive deficits. Able to self-feed with supervision and set-up.             OT Problem List: Decreased strength;Decreased activity tolerance;Impaired balance (sitting and/or standing);Decreased safety awareness;Decreased knowledge of use of DME or AE;Decreased knowledge of precautions;Decreased cognition;Decreased coordination;Impaired vision/perception;Pain      OT Treatment/Interventions: Self-care/ADL training;Therapeutic exercise;Energy conservation;DME and/or AE instruction;Therapeutic activities;Patient/family education;Balance training;Cognitive remediation/compensation    OT Goals(Current goals can be found in the care plan section) Acute Rehab OT Goals Patient Stated Goal: (P) repetitively asking to go home OT Goal Formulation: Patient unable to participate in goal setting(with family) Time For Goal Achievement: 03/21/17 Potential to Achieve Goals: Good ADL Goals Pt Will Perform Eating: with set-up;sitting(with minimal VC's) Pt Will Perform Grooming: with set-up;with supervision;sitting(with minimal verbal cueing) Pt Will Perform Lower Body Dressing: with supervision;sit to/from stand(with minimal VC's) Pt Will Transfer to Toilet: with supervision;bedside commode;ambulating(with RW and minimal VC's)  OT Frequency: Min 2X/week   Barriers to D/C:            Co-evaluation              AM-PAC PT "6 Clicks" Daily Activity     Outcome Measure Help from another person eating meals?: A  Little Help from another person taking care of personal grooming?: A Little Help from another person toileting, which includes using toliet, bedpan, or urinal?: A  Lot Help from another person bathing (including washing, rinsing, drying)?: A Lot Help from another person to put on and taking off regular upper body clothing?: A Little Help from another person to put on and taking off regular lower body clothing?: A Little 6 Click Score: 16   End of Session Equipment Utilized During Treatment: Gait belt;Rolling walker  Activity Tolerance: Patient tolerated treatment well Patient left: in bed;with call bell/phone within reach;with bed alarm set;with family/visitor present  OT Visit Diagnosis: Other abnormalities of gait and mobility (R26.89);Muscle weakness (generalized) (M62.81);Other symptoms and signs involving cognitive function                Time: [id number] OT Time Calculation (min): 20 min Charges:  OT General Charges $OT Visit: 1 Visit OT Evaluation $OT Eval Moderate Complexity: 1 Mod G-Codes:     Doristine Sectionharity A Wlliam Grosso, MS OTR/L  Pager: [phone]   Erlene Devita A Chez Bulnes 03/07/2017, 4:01 PM

## 2017-03-07 NOTE — Progress Notes (Addendum)
Patient ID: Bonnie Mora, female   DOB: [birth year], 84 y.o.   MRN: [id number]  PROGRESS NOTE    KENZLEI RUNIONS  WUJ:[id number] DOB: [birth year] DOA: 03/06/2017 PCP: [name], MD   Brief Narrative:  84 year old female with history of advanced dementia, hypertension and anxiety presented with increasing confusion.  Patient was admitted with UTI and started on antibiotics.  MRI of the brain was also ordered.   Assessment & Plan:   Principal Problem:   Acute encephalopathy Active Problems:   Dementia   HLD (hyperlipidemia)   Essential hypertension   Acute lower UTI    1. Acute probable metabolic encephalopathy -likely from UTI.  However since patient symptoms started acutely MRI brain was ordered, which is pending.    Continue Rocephin.  Follow urine culture.    PT/OT/SLP evaluation  2. Hypertension: Blood pressure better controlled.  Will monitor.  Continue metoprolol.  3. History of dementia: Monitor mental status.  Fall precautions. 4. History of anxiety: Continue citalopram.  DVT prophylaxis: Lovenox Code Status: DNR Family Communication: Spoke to patient's caregiver at bedside.  No family member present Disposition Plan: Home in 1-2 days  Consultants: None  Procedures: None  Antimicrobials:  Rocephin    Subjective: Patient seen and examined at bedside.  She is sleeping, wakes up slightly on calling her name but is confused.  No overnight fever or vomiting.  Objective: Vitals:   03/06/17 1900 03/06/17 2120 03/06/17 2252 03/07/17 0540  BP: (!) 164/65  (!) 174/91 95/72  Pulse: (!) 55  67 72  Resp: 18  18 18   Temp:  98.5 F (36.9 C) 98.2 F (36.8 C) 98.1 F (36.7 C)  TempSrc:   Oral Oral  SpO2: 93%  94% 96%  Weight:   69.2 kg (152 lb 8.9 oz)   Height:   5' (1.524 m)     Intake/Output Summary (Last 24 hours) at 03/07/2017 0949 Last data filed at 03/07/2017 0710 Gross per 24 hour  Intake 290.83 ml  Output 200 ml  Net 90.83 ml   Filed Weights   03/06/17 1501 03/06/17 2252  Weight: 67.1 kg (148 lb) 69.2 kg (152 lb 8.9 oz)    Examination:  General exam: Elderly female lying in bed.  Wakes up slightly but is confused Respiratory system: Bilateral decreased breath sound at bases Cardiovascular system: S1 & S2 heard, rate controlled  gastrointestinal system: Abdomen is nondistended, soft and nontender. Normal bowel sounds heard. Extremities: No cyanosis, clubbing, edema   Data Reviewed: I have personally reviewed following labs and imaging studies  CBC: Recent Labs  Lab 03/06/17 1505  WBC 5.9  HGB 13.2  HCT 41.8  MCV 93.3  PLT 221   Basic Metabolic Panel: Recent Labs  Lab 03/06/17 1505  NA 141  K 4.0  CL 106  CO2 22  GLUCOSE 169*  BUN 19  CREATININE 0.79  CALCIUM 9.2   GFR: Estimated Creatinine Clearance: 42.2 mL/min (by C-G formula based on SCr of 0.79 mg/dL). Liver Function Tests: Recent Labs  Lab 03/06/17 1505  AST 25  ALT 18  ALKPHOS 69  BILITOT 0.7  PROT 6.7  ALBUMIN 3.5   No results for input(s): LIPASE, AMYLASE in the last 168 hours. No results for input(s): AMMONIA in the last 168 hours. Coagulation Profile: No results for input(s): INR, PROTIME in the last 168 hours. Cardiac Enzymes: No results for input(s): CKTOTAL, CKMB, CKMBINDEX, TROPONINI in the last 168 hours. BNP (last 3 results) No results for  input(s): PROBNP in the last 8760 hours. HbA1C: No results for input(s): HGBA1C in the last 72 hours. CBG: Recent Labs  Lab 03/06/17 1531  GLUCAP 140*   Lipid Profile: No results for input(s): CHOL, HDL, LDLCALC, TRIG, CHOLHDL, LDLDIRECT in the last 72 hours. Thyroid Function Tests: No results for input(s): TSH, T4TOTAL, FREET4, T3FREE, THYROIDAB in the last 72 hours. Anemia Panel: No results for input(s): VITAMINB12, FOLATE, FERRITIN, TIBC, IRON, RETICCTPCT in the last 72 hours. Sepsis Labs: No results for input(s): PROCALCITON, LATICACIDVEN in the last 168 hours.  No results  found for this or any previous visit (from the past 240 hour(s)).       Radiology Studies: Dg Chest 2 View  Result Date: 03/06/2017 CLINICAL DATA:  Altered mental status. EXAM: CHEST  2 VIEW COMPARISON:  Chest x-ray dated January 21, 2017. FINDINGS: The heart size and mediastinal contours are within normal limits. Normal pulmonary vascularity. Atherosclerotic calcification of the aortic arch. Left basilar atelectasis/scarring in noted. No focal consolidation, pleural effusion, or pneumothorax. No acute osseous abnormality. Unchanged eventration of the right hemidiaphragm. IMPRESSION: No active cardiopulmonary disease. Electronically Signed   By: Obie DredgeWilliam T Derry M.D.   On: 03/06/2017 16:50   Ct Head Wo Contrast  Result Date: 03/06/2017 CLINICAL DATA:  Confusion. EXAM: CT HEAD WITHOUT CONTRAST TECHNIQUE: Contiguous axial images were obtained from the base of the skull through the vertex without intravenous contrast. COMPARISON:  03/16/2009 FINDINGS: Brain: There is motion artifact predominantly through the skull base. Encephalomalacia in the anterior right frontal lobe appears to have mildly progressed from 2011 and may be posttraumatic or postischemic, with motion artifact through this region mildly limiting assessment. No definite acute large territory infarct, intracranial hemorrhage, mass, midline shift, or extra-axial fluid collection is identified. Periventricular white matter hypodensities of progressed and are nonspecific but compatible with moderate chronic small vessel ischemic disease. There is moderate cerebral atrophy. Vascular: Calcified atherosclerosis at the skull base. No hyperdense vessel. Skull: No fracture or suspicious osseous lesion. Sinuses/Orbits: Visualized paranasal sinuses and mastoid air cells are grossly clear. No gross acute abnormality in the included orbits. Other: None. IMPRESSION: 1. Motion degraded examination without definite acute intracranial abnormality identified. 2.  Suspected progressive encephalomalacia in the anterior right frontal lobe. 3. Progressive, moderate chronic small vessel ischemic disease and cerebral atrophy. Electronically Signed   By: Sebastian AcheAllen  Grady M.D.   On: 03/06/2017 18:04        Scheduled Meds: . aspirin  300 mg Rectal Daily   Or  . aspirin  325 mg Oral Daily  . atorvastatin  20 mg Oral QHS  . citalopram  20 mg Oral Daily  . enoxaparin (LOVENOX) injection  40 mg Subcutaneous Q24H  . metoprolol succinate  50 mg Oral Q breakfast  . traZODone  100 mg Oral QHS   Continuous Infusions: . sodium chloride 50 mL/hr at 03/06/17 2311  . cefTRIAXone (ROCEPHIN)  IV Stopped (03/07/17 0030)     LOS: 0 days        Glade LloydKshitiz Duward Allbritton, MD [facility] Pager [phone]  If 7PM-7AM, please contact night-coverage [email or web address] Password [email or web address] 03/07/2017, 9:49 AM

## 2017-03-07 NOTE — Evaluation (Signed)
Physical Therapy Evaluation Patient Details Name: Bonnie Mora MRN: [id number] DOB: 02-Aug-1926 Today's Date: 03/07/2017   History of Present Illness  Pt is an 84 y.o. female with history of advanced dementia, hypertension,a nd anxiety who was brought to the ED after being found to be increasingly confused and agitated. Admitted with acute probable metabolic encephalopathy likely from UTI and started on antibiotics. CT revealed suspected progressive encephalomalacia in the anterior right frontal lobe. MRI pending.   Clinical Impression  Pt is emotionally labile, and very unsteady on her feet requiring min to mod constant hands on assist for safety during short distance gait to the bathroom.  She is appropriate for SNF level rehab if she qualifies at discharge.   PT to follow acutely for deficits listed below.       Follow Up Recommendations SNF    Equipment Recommendations  None recommended by PT    Recommendations for Other Services   NA    Precautions / Restrictions Precautions Precautions: Fall Precaution Comments: very high fall risk and refuses to use RW at home Restrictions Weight Bearing Restrictions: No      Mobility  Bed Mobility Overal bed mobility: Needs Assistance Bed Mobility: Supine to Sit;Sit to Supine     Supine to sit: Min assist Sit to supine: Min guard   General bed mobility comments: Min hand held assist to sequence and initiate movement towards EOB.  Needs multimodal cues  Transfers Overall transfer level: Needs assistance Equipment used: Rolling walker (2 wheeled) Transfers: Sit to/from Stand Sit to Stand: Min assist;Mod assist         General transfer comment: Up to mod assist from lower surfaces and especially to ensure that pt hits her sitting target (i.e. commode and bed).   Ambulation/Gait Ambulation/Gait assistance: Mod assist Ambulation Distance (Feet): 15 Feet Assistive device: Rolling walker (2 wheeled);1 person hand held assist Gait  Pattern/deviations: Step-through pattern;Shuffle;Trunk flexed     General Gait Details: Pt is much safer with RW, but not use without insisting.  With only hand held assist pt requires up to mod assist for balance and tends to tip forward.          Balance Overall balance assessment: Needs assistance Sitting-balance support: No upper extremity supported;Feet supported Sitting balance-Leahy Scale: Fair     Standing balance support: During functional activity;Bilateral upper extremity supported Standing balance-Leahy Scale: Poor Standing balance comment: Relies on B UE support and min assist from therapist to maintain balance.                              Pertinent Vitals/Pain Pain Assessment: No/denies pain    Home Living Family/patient expects to be discharged to:: Skilled nursing facility Living Arrangements: Non-relatives/Friends(24 hour caregivers)               Additional Comments: Lives with 24 hour caregivers. Per daughter, planning to D/C to higher level of care.     Prior Function Level of Independence: Needs assistance   Gait / Transfers Assistance Needed: Has RW but does not use. Requires hands on assist for all mobility.   ADL's / Homemaking Assistance Needed: Requires assistance for all tasks due to cognitive deficits. Able to self-feed with supervision and set-up.            Extremity/Trunk Assessment   Upper Extremity Assessment Upper Extremity Assessment: Defer to OT evaluation    Lower Extremity Assessment Lower Extremity Assessment: Generalized weakness  Cervical / Trunk Assessment Cervical / Trunk Assessment: Kyphotic  Communication      Cognition Arousal/Alertness: Awake/alert Behavior During Therapy: (emotionally labile; smiling/laughing to crying in moments) Overall Cognitive Status: History of cognitive impairments - at baseline Area of Impairment: Orientation;Attention;Memory;Following  commands;Safety/judgement;Awareness;Problem solving                 Orientation Level: Disoriented to;Place;Time;Situation Current Attention Level: Sustained Memory: Decreased recall of precautions;Decreased short-term memory Following Commands: Follows one step commands consistently;Follows one step commands with increased time Safety/Judgement: Decreased awareness of safety;Decreased awareness of deficits Awareness: Intellectual Problem Solving: Slow processing;Decreased initiation;Difficulty sequencing;Requires verbal cues;Requires tactile cues General Comments: H/o dementia, emtionally labile, crying, laughing and cussing within seconds.       General Comments General comments (skin integrity, edema, etc.): Daughter, Elita Quick present and engaged in session.         Assessment/Plan    PT Assessment Patient needs continued PT services  PT Problem List Decreased strength;Decreased activity tolerance;Decreased mobility;Decreased balance;Decreased coordination;Decreased cognition;Decreased knowledge of use of DME;Decreased safety awareness;Decreased knowledge of precautions       PT Treatment Interventions DME instruction;Gait training;Stair training;Functional mobility training;Therapeutic activities;Therapeutic exercise;Balance training;Neuromuscular re-education;Cognitive remediation;Patient/family education    PT Goals (Current goals can be found in the Care Plan section)  Acute Rehab PT Goals Patient Stated Goal: repetitively asking to go home PT Goal Formulation: With family Time For Goal Achievement: 03/21/17 Potential to Achieve Goals: Good    Frequency Min 2X/week           AM-PAC PT "6 Clicks" Daily Activity  Outcome Measure Difficulty turning over in bed (including adjusting bedclothes, sheets and blankets)?: Unable Difficulty moving from lying on back to sitting on the side of the bed? : Unable Difficulty sitting down on and standing up from a chair with arms  (e.g., wheelchair, bedside commode, etc,.)?: Unable Help needed moving to and from a bed to chair (including a wheelchair)?: A Lot Help needed walking in hospital room?: A Lot Help needed climbing 3-5 steps with a railing? : A Lot 6 Click Score: 9    End of Session Equipment Utilized During Treatment: Gait belt Activity Tolerance: Patient tolerated treatment well Patient left: in bed;with call bell/phone within reach;with bed alarm set;with family/visitor present   PT Visit Diagnosis: Unsteadiness on feet (R26.81);Muscle weakness (generalized) (M62.81);Difficulty in walking, not elsewhere classified (R26.2)    Time: 1436-1500 PT Time Calculation (min) (ACUTE ONLY): 24 min   Charges:       Lurena Joiner B. Athol Bolds, PT, DPT [phone]   PT Evaluation $PT Eval Moderate Complexity: 1 Mod PT Treatments $Gait Training: 8-22 mins   03/07/2017, 4:05 PM

## 2017-03-08 ENCOUNTER — Inpatient Hospital Stay (HOSPITAL_COMMUNITY): Payer: Medicare Other

## 2017-03-08 DIAGNOSIS — I639 Cerebral infarction, unspecified: Secondary | ICD-10-CM

## 2017-03-08 DIAGNOSIS — G309 Alzheimer's disease, unspecified: Secondary | ICD-10-CM

## 2017-03-08 DIAGNOSIS — I6330 Cerebral infarction due to thrombosis of unspecified cerebral artery: Secondary | ICD-10-CM

## 2017-03-08 DIAGNOSIS — Z8673 Personal history of transient ischemic attack (TIA), and cerebral infarction without residual deficits: Secondary | ICD-10-CM

## 2017-03-08 DIAGNOSIS — I10 Essential (primary) hypertension: Secondary | ICD-10-CM

## 2017-03-08 DIAGNOSIS — E785 Hyperlipidemia, unspecified: Secondary | ICD-10-CM

## 2017-03-08 DIAGNOSIS — F0280 Dementia in other diseases classified elsewhere without behavioral disturbance: Secondary | ICD-10-CM

## 2017-03-08 LAB — ECHOCARDIOGRAM COMPLETE
CHL CUP MV DEC (S): 335
E decel time: 335 msec
E/e' ratio: 7.97
FS: 42 % (ref 28–44)
HEIGHTINCHES: 60 in
IV/PV OW: 0.83
LA vol index: 23.9 mL/m2
LADIAMINDEX: 2.47 cm/m2
LASIZE: 41 mm
LAVOL: 39.6 mL
LAVOLA4C: 40.8 mL
LDCA: 3.14 cm2
LEFT ATRIUM END SYS DIAM: 41 mm
LV E/e' medial: 7.97
LV E/e'average: 7.97
LV PW d: 12 mm — AB (ref 0.6–1.1)
LV TDI E'MEDIAL: 5.98
LVELAT: 6.64 cm/s
LVOT diameter: 20 mm
Lateral S' vel: 11.2 cm/s
MV pk A vel: 92.2 m/s
MV pk E vel: 52.9 m/s
RV TAPSE: 17.5 mm
TDI e' lateral: 6.64
WEIGHTICAEL: 2440.93 [oz_av]

## 2017-03-08 LAB — CBC WITH DIFFERENTIAL/PLATELET
Basophils Absolute: 0 10*3/uL (ref 0.0–0.1)
Basophils Relative: 1 %
Eosinophils Absolute: 0.1 10*3/uL (ref 0.0–0.7)
Eosinophils Relative: 2 %
HEMATOCRIT: 38.1 % (ref 36.0–46.0)
HEMOGLOBIN: 12 g/dL (ref 12.0–15.0)
LYMPHS ABS: 1.6 10*3/uL (ref 0.7–4.0)
LYMPHS PCT: 27 %
MCH: 29.3 pg (ref 26.0–34.0)
MCHC: 31.5 g/dL (ref 30.0–36.0)
MCV: 93.2 fL (ref 78.0–100.0)
MONOS PCT: 11 %
Monocytes Absolute: 0.7 10*3/uL (ref 0.1–1.0)
NEUTROS ABS: 3.5 10*3/uL (ref 1.7–7.7)
NEUTROS PCT: 59 %
Platelets: 210 10*3/uL (ref 150–400)
RBC: 4.09 MIL/uL (ref 3.87–5.11)
RDW: 14 % (ref 11.5–15.5)
WBC: 6 10*3/uL (ref 4.0–10.5)

## 2017-03-08 LAB — BASIC METABOLIC PANEL
Anion gap: 11 (ref 5–15)
BUN: 18 mg/dL (ref 6–20)
CHLORIDE: 108 mmol/L (ref 101–111)
CO2: 23 mmol/L (ref 22–32)
CREATININE: 0.78 mg/dL (ref 0.44–1.00)
Calcium: 8.9 mg/dL (ref 8.9–10.3)
GFR calc Af Amer: >60 mL/min (ref >60)
GFR calc non Af Amer: >60 mL/min (ref >60)
Glucose, Bld: 108 mg/dL — ABNORMAL HIGH (ref 65–99)
Potassium: 4.6 mmol/L (ref 3.5–5.1)
Sodium: 142 mmol/L (ref 135–145)

## 2017-03-08 LAB — LIPID PANEL
CHOL/HDL RATIO: 2.5 ratio
CHOLESTEROL: 138 mg/dL (ref 0–200)
HDL: 56 mg/dL (ref >40)
LDL Cholesterol: 59 mg/dL (ref 0–99)
Triglycerides: 115 mg/dL (ref <150)
VLDL: 23 mg/dL (ref 0–40)

## 2017-03-08 LAB — HEMOGLOBIN A1C
Hgb A1c MFr Bld: 6.3 % — ABNORMAL HIGH (ref 4.8–5.6)
MEAN PLASMA GLUCOSE: 134.11 mg/dL

## 2017-03-08 LAB — URINE CULTURE: Culture: >=100000 — AB

## 2017-03-08 LAB — MAGNESIUM: Magnesium: 2 mg/dL (ref 1.7–2.4)

## 2017-03-08 MED ORDER — IOPAMIDOL (ISOVUE-370) INJECTION 76%
INTRAVENOUS | Status: AC
  Filled 2017-03-08: qty 50

## 2017-03-08 MED ORDER — ONDANSETRON HCL 4 MG/2ML IJ SOLN
4.0000 mg | Freq: Four times a day (QID) | INTRAMUSCULAR | Status: DC | PRN
  Filled 2017-03-08: qty 2

## 2017-03-08 MED ORDER — IOPAMIDOL (ISOVUE-370) INJECTION 76%
INTRAVENOUS | Status: AC
  Administered 2017-03-08: 50 mL via INTRAVENOUS
  Filled 2017-03-08: qty 50

## 2017-03-08 MED ORDER — SODIUM CHLORIDE 0.9 % IV SOLN
1.0000 g | Freq: Two times a day (BID) | INTRAVENOUS | Status: DC
  Administered 2017-03-08 – 2017-03-10 (×5): 1 g via INTRAVENOUS
  Filled 2017-03-08 (×6): qty 1

## 2017-03-08 MED ORDER — LORAZEPAM 2 MG/ML IJ SOLN
1.0000 mg | Freq: Once | INTRAMUSCULAR | Status: AC
  Administered 2017-03-08: 1 mg via INTRAVENOUS
  Filled 2017-03-08: qty 1

## 2017-03-08 NOTE — Evaluation (Signed)
Speech Language Pathology Evaluation Patient Details Name: Bonnie Mora MRN: [id number] DOB: [birth year] Today's Date: 03/08/2017 Time: 4098-1191 SLP Time Calculation (min) (ACUTE ONLY): 25 min  Problem List:  Patient Active Problem List   Diagnosis Date Noted  . Acute encephalopathy 03/06/2017  . Acute lower UTI 03/06/2017  . Chronic left shoulder pain 08/19/2016  . Routine general medical examination at a health care facility 04/25/2016  . Diarrhea 11/03/2014  . Dementia 03/10/2014  . HLD (hyperlipidemia) 03/10/2014  . Essential hypertension 03/10/2014   Past Medical History:  Past Medical History:  Diagnosis Date  . Dementia   . Hyperlipidemia   . Hypertension   . Parkinson disease [facility])    Past Surgical History:  Past Surgical History:  Procedure Laterality Date  . APPENDECTOMY    . JOINT REPLACEMENT     bilateral knee  . OVARIAN CYST SURGERY    . TONSILLECTOMY AND ADENOIDECTOMY     HPI:  Pt is an 84 y.o. female with PMH of advanced dementia hypertension anxiety was brought to the ER after patient was found to be increasingly confused.  Patient's confusion started around 12 noon.  Prior to that patient was at baseline.  Patient also became agitated.  As per the caregiver patient did not have any nausea vomiting abdominal did not have any chest pain or shortness of breath.  Gets confused as to when she gets UTI. MRI showed acute infarct L corona radiata. CXR showed nothing acute. Bedside swallow eval and cognitive-linguistic evals ordered.    Assessment / Plan / Recommendation Clinical Impression  Pt with moderate-severe cognitive deficits and mild expressive language deficits. Pt with baseline dementia; daughter at bedside feels symptoms have worsened since hospital stay. Pt oriented to self only, decreased ability to follow multistep commands, decreased short-term memory and reasoning/ problem solving. Daughter has noted phonemic paraphasias; this was not noted during  evaluation but pt did have difficulty with divergent naming task which seemed due to decreased sustained attention (was asked to name animals and began naming foods/ repeating things previously stated). Pt will benefit from continued speech therapy acutely and at next level of care for cognitive-linguistic skills along with family/ caregiver education and compensatory strategy training. Son/ daughter agree with recommendations; reviewed simple compensatory strategies- repetition of information, visual cues/ aides for orientation, redirection. Will continue to follow.     SLP Assessment  SLP Recommendation/Assessment: Patient needs continued Speech Lanaguage Pathology Services SLP Visit Diagnosis: Cognitive communication deficit (R41.841)    Follow Up Recommendations  Skilled Nursing facility    Frequency and Duration min 2x/week  2 weeks      SLP Evaluation Cognition  Overall Cognitive Status: Impaired/Different from baseline Arousal/Alertness: Awake/alert Orientation Level: Oriented to person;Disoriented to place;Disoriented to time;Disoriented to situation Attention: Sustained;Selective Sustained Attention: Impaired Sustained Attention Impairment: Verbal basic;Functional basic Memory: Impaired Memory Impairment: Decreased recall of new information;Decreased short term memory Decreased Short Term Memory: Verbal basic Awareness: Impaired       Comprehension  Auditory Comprehension Overall Auditory Comprehension: Impaired Yes/No Questions: Within Functional Limits Commands: Impaired Multistep Basic Commands: 50-74% accurate Reading Comprehension Reading Status: Impaired    Expression Expression Primary Mode of Expression: Verbal Verbal Expression Overall Verbal Expression: Impaired Initiation: No impairment Level of Generative/Spontaneous Verbalization: Sentence Naming: Impairment Confrontation: Impaired Convergent: 75-100% accurate Divergent: 50-74% accurate Pragmatics:  Impairment Impairments: Other (comment)(emotionally labile) Non-Verbal Means of Communication: Not applicable   Oral / Motor  Oral Motor/Sensory Function Overall Oral Motor/Sensory Function: Within functional limits Motor  Speech Overall Motor Speech: Appears within functional limits for tasks assessed   GO                    Metro Kungmy K Oleksiak, MA, CCC-SLP 03/08/2017, 2:05 PM  [phone]

## 2017-03-08 NOTE — Evaluation (Signed)
Clinical/Bedside Swallow Evaluation Patient Details  Name: Bonnie NorthDorothy J Mora MRN: [id number] Date of Birth: 1926-12-14  Today's Date: 03/08/2017 Time: SLP Start Time (ACUTE ONLY): 1325 SLP Stop Time (ACUTE ONLY): 1350 SLP Time Calculation (min) (ACUTE ONLY): 25 min  Past Medical History:  Past Medical History:  Diagnosis Date  . Dementia   . Hyperlipidemia   . Hypertension   . Parkinson disease [facility](HCC)    Past Surgical History:  Past Surgical History:  Procedure Laterality Date  . APPENDECTOMY    . JOINT REPLACEMENT     bilateral knee  . OVARIAN CYST SURGERY    . TONSILLECTOMY AND ADENOIDECTOMY     HPI:  Pt is an 84 y.o. female with PMH of advanced dementia hypertension anxiety was brought to the ER after patient was found to be increasingly confused.  Patient's confusion started around 12 noon.  Prior to that patient was at baseline.  Patient also became agitated.  As per the caregiver patient did not have any nausea vomiting abdominal did not have any chest pain or shortness of breath.  Gets confused as to when she gets UTI. MRI showed acute infarct L corona radiata. CXR showed nothing acute. Bedside swallow eval and cognitive-linguistic evals ordered.    Assessment / Plan / Recommendation Clinical Impression  Pt showed no overt s/s of aspiration with regular food or thin liquid consistencies during clinical evaluation. Pt does have mildly prolonged mastication of solid but this may be due to the particular texture (pulled pork). Cognitive status may mildly increase pt's risk of aspiration. Recommend regular diet, thin liquids, meds whole with liquid, supervision to assist with feeding as needed. SLP will sign off for swallow order, continue to follow for cognition/ language. SLP Visit Diagnosis: Dysphagia, unspecified (R13.10)    Aspiration Risk  Mild aspiration risk    Diet Recommendation Regular;Thin liquid   Liquid Administration via: Cup;Straw Medication Administration: Whole  meds with liquid Supervision: Staff to assist with self feeding;Full supervision/cueing for compensatory strategies Compensations: Minimize environmental distractions;Slow rate;Small sips/bites Postural Changes: Seated upright at 90 degrees    Other  Recommendations Oral Care Recommendations: Oral care BID   Follow up Recommendations Skilled Nursing facility      Frequency and Duration            Prognosis        Swallow Study   General HPI: Pt is an [age] y.o. female with PMH of advanced dementia hypertension anxiety was brought to the ER after patient was found to be increasingly confused.  Patient's confusion started around 12 noon.  Prior to that patient was at baseline.  Patient also became agitated.  As per the caregiver patient did not have any nausea vomiting abdominal did not have any chest pain or shortness of breath.  Gets confused as to when she gets UTI. MRI showed acute infarct L corona radiata. CXR showed nothing acute. Bedside swallow eval and cognitive-linguistic evals ordered.  Type of Study: Bedside Swallow Evaluation Previous Swallow Assessment: none in chart Diet Prior to this Study: Regular;Thin liquids Temperature Spikes Noted: No Respiratory Status: Room air History of Recent Intubation: No Behavior/Cognition: Alert;Cooperative;Pleasant mood Oral Cavity Assessment: Within Functional Limits Oral Care Completed by SLP: No Oral Cavity - Dentition: Adequate natural dentition Vision: Functional for self-feeding Self-Feeding Abilities: Needs assist Patient Positioning: Upright in chair Baseline Vocal Quality: Normal Volitional Cough: Strong    Oral/Motor/Sensory Function Overall Oral Motor/Sensory Function: Within functional limits   Ice Chips Ice chips: Not tested  Thin Liquid Thin Liquid: Within functional limits Presentation: Cup;Straw    Nectar Thick Nectar Thick Liquid: Not tested   Honey Thick Honey Thick Liquid: Not tested   Puree Puree: Not tested    Solid   GO   Solid: Within functional limits Presentation: Acey Lav, MA, CCC-SLP 03/08/2017,1:52 PM  [phone]

## 2017-03-08 NOTE — Progress Notes (Signed)
Pharmacy Antibiotic Note  Bonnie Mora is a 84 y.o. female admitted on 03/06/2017 with sudden onset of confusion concerning for UTI.  Pharmacy has been consulted for meropenem dosing due to ESBL EColi in urine.  Plan: Meropenem 1g IV q12h Follow clinical progression, renal function, LOT  Height: 5' (152.4 cm) Weight: 152 lb 8.9 oz (69.2 kg) IBW/kg (Calculated) : 45.5  Temp (24hrs), Avg:97.9 F (36.6 C), Min:97.5 F (36.4 C), Max:98.3 F (36.8 C)  Recent Labs  Lab 03/06/17 1505 03/07/17 0906 03/08/17 0544  WBC 5.9 6.0 6.0  CREATININE 0.79 0.74 0.78    Estimated Creatinine Clearance: 42.2 mL/min (by C-G formula based on SCr of 0.78 mg/dL).    Allergies  Allergen Reactions  . Codeine Nausea And Vomiting    CTX 2/15>>2/17 Meropenem 2/17>>  2/15 urine: ESBL Ecoli 2/17 BCx:  Thank you for allowing pharmacy to be a part of this patient's care.  Cadel Stairs D. Adriann Thau, PharmD, BCPS Clinical Pharmacist Clinical Phone for 03/08/2017 until 3:30pm: [phone] If after 3:30pm, please call main pharmacy at [phone] 03/08/2017 1:41 PM

## 2017-03-08 NOTE — Consult Note (Signed)
Referring Physician: Dr Pauletta Browns    Chief Complaint: None per pt  HPI: Bonnie Mora is an 84 y.o. female who has PD, HTN, HLD and advanced dementia, baseline mRS 3-4, who has full time 24/7 caregivers at home. She presented to the ER on 2/15 with AMS and some associated word finding difficulty. There were no focal deficits reported at that time. She has had this in the past when diagnosed with UTIs. UA suggests a possible UTI, but brain MRI was also done as part of work up by primary team. This showed a small subcortical stroke in the left [facility]. She has considerable small vessel disease. Neurology was consulted for this new finding of stroke. Upon exam NIH is 2, but score is increased not for clinical findings of stroke, but rather for her dementia, which is back to baseline now per family.   Past Medical History Past Medical History:  Diagnosis Date  . Dementia   . Hyperlipidemia   . Hypertension   . Parkinson disease [employer]     Surgical History Past Surgical History:  Procedure Laterality Date  . APPENDECTOMY    . JOINT REPLACEMENT     bilateral knee  . OVARIAN CYST SURGERY    . TONSILLECTOMY AND ADENOIDECTOMY      Family History  Family History  Problem Relation Age of Onset  . ALS Mother   . Heart disease Father   . Heart disease Maternal Grandfather   . Cancer Maternal Grandfather        breast    Social History:   reports that  has never smoked. she has never used smokeless tobacco. She reports that she does not drink alcohol or use drugs.  Allergies:  Allergies  Allergen Reactions  . Codeine Nausea And Vomiting    Home Medications:  Medications Prior to Admission  Medication Sig Dispense Refill  . albuterol (PROVENTIL HFA;VENTOLIN HFA) 108 (90 Base) MCG/ACT inhaler Inhale 2 puffs into the lungs every 6 (six) hours as needed for wheezing or shortness of breath. 1 Inhaler 2  . atorvastatin (LIPITOR) 20 MG tablet Take 1 tablet (20 mg total) by mouth daily  at 6 PM. (Patient taking differently: Take 20 mg by mouth at bedtime. ) 90 tablet 3  . citalopram (CELEXA) 20 MG tablet Take 1 tablet (20 mg total) by mouth daily. 90 tablet 3  . LORazepam (ATIVAN) 1 MG tablet Take 1 mg by mouth daily as needed.  0  . metoprolol succinate (TOPROL-XL) 50 MG 24 hr tablet Take 1 tablet (50 mg total) by mouth daily with breakfast. 90 tablet 3  . mupirocin ointment (BACTROBAN) 2 % Place 1 application into the nose daily as needed (wound care).    . traZODone (DESYREL) 100 MG tablet Take 1 tablet (100 mg total) by mouth at bedtime. 90 tablet 3  . ALPRAZolam (XANAX) 0.5 MG tablet Take 0.5-1 tablets (0.25-0.5 mg total) by mouth every 12 (twelve) hours as needed for anxiety. (Patient not taking: Reported on 03/06/2017) 14 tablet 0  . promethazine-dextromethorphan (PROMETHAZINE-DM) 6.25-15 MG/5ML syrup Take 5 mLs by mouth 3 (three) times daily as needed for cough. (Patient not taking: Reported on 03/06/2017) 120 mL 0  . saccharomyces boulardii (FLORASTOR) 250 MG capsule Take 1 capsule (250 mg total) by mouth 2 (two) times daily. (Patient not taking: Reported on 03/06/2017) 30 capsule 0    Hospital Medications . aspirin  300 mg Rectal Daily   Or  . aspirin  325 mg Oral  Daily  . atorvastatin  20 mg Oral QHS  . citalopram  20 mg Oral Daily  . enoxaparin (LOVENOX) injection  40 mg Subcutaneous Q24H  . LORazepam  1 mg Intravenous Once  . metoprolol succinate  50 mg Oral Q breakfast  . traZODone  100 mg Oral QHS    ROS: Unable to complete d/t dementia. Denies any pain or h/a:   Physical Examination:  Vitals:   03/07/17 1419 03/07/17 2044 03/08/17 0437 03/08/17 1225  BP: (!) 125/59 (!) 125/51 124/79 (!) 120/103  Pulse: 63 60 60 74  Resp: (!) 24 18 18    Temp: (!) 97.5 F (36.4 C) 98 F (36.7 C) 98.3 F (36.8 C)   TempSrc: Oral Oral Oral   SpO2: 95%  96%   Weight:      Height:        General - frail, elderly pt, pleasantly confused Heart - Regular rate and rhythm  - no murmer appreciated Lungs - Clear to auscultation, no sob  Abdomen - Soft, distended, non tender Extremities - Distal pulses intact - no edema Skin - Warm and dry   Neurologic Examination:  Mental Status: Alert, pleasantly confused. Able to tell me her full name, her DOB, but incorrect age. She tells me its April, makes jokes about what year it may be, but cannot give any answer. She is able to choose from a list of options as to place as "hospital". Speech fluent without evidence of aphasia. Able to name some common items. No dysarthria. Able to follow 3 step commands if assisted with cues.  Cranial Nerves: Face symmetric, with normal sensation. Tongue is midline. Visual fields grossly normal, PERRLA, EOMI. Shrugs equal.  Motor: Right : Upper extremity   5/5    Left:     Upper extremity   5/5  Lower extremity   5/5     Lower extremity   5/5 Tone and bulk:normal tone throughout Sensory: Light touch intact throughout, bilaterally Deep Tendon Reflexes: 2+ and symmetric throughout Plantars: mute Cerebellar: normal finger-to-nose and normal heel-to-shin test Gait: deferred at this time.   NIHSS 1a Level of Conscious:0 1b LOC Questions: 2 1c LOC Commands:0  2 Best Gaze: 0 3 Visual: 0 4 Facial Palsy:0  5a Motor Arm - left:0  5b Motor Arm - Right:0  6a Motor Leg - Left: 0 6b Motor Leg - Right: 0 7 Limb Ataxia: 0 8 Sensory: 0 9 Best Language: 0 10 Dysarthria:0 11 Extinct. and Inattention:0 TOTAL: 2   LABORATORY STUDIES:  Basic Metabolic Panel: Recent Labs  Lab 03/06/17 1505 03/07/17 0906 03/08/17 0544  NA 141 140 142  K 4.0 4.0 4.6  CL 106 106 108  CO2 22 24 23   GLUCOSE 169* 108* 108*  BUN 19 15 18   CREATININE 0.79 0.74 0.78  CALCIUM 9.2 8.9 8.9  MG  --  1.9 2.0    Liver Function Tests: Recent Labs  Lab 03/06/17 1505 03/07/17 0906  AST 25 21  ALT 18 15  ALKPHOS 69 66  BILITOT 0.7 0.6  PROT 6.7 6.1*  ALBUMIN 3.5 3.1*   No results for input(s):  LIPASE, AMYLASE in the last 168 hours. No results for input(s): AMMONIA in the last 168 hours.  CBC: Recent Labs  Lab 03/06/17 1505 03/07/17 0906 03/08/17 0544  WBC 5.9 6.0 6.0  NEUTROABS  --  3.7 3.5  HGB 13.2 11.8* 12.0  HCT 41.8 37.6 38.1  MCV 93.3 93.5 93.2  PLT 221 207 210  Cardiac Enzymes: No results for input(s): CKTOTAL, CKMB, CKMBINDEX, TROPONINI in the last 168 hours.  BNP: Invalid input(s): POCBNP  CBG: Recent Labs  Lab 03/06/17 1531  GLUCAP 140*    Microbiology:   Coagulation Studies: No results for input(s): LABPROT, INR in the last 72 hours.  Urinalysis:  Recent Labs  Lab 03/06/17 1618  COLORURINE YELLOW  LABSPEC 1.024  PHURINE 5.0  GLUCOSEU NEGATIVE  HGBUR NEGATIVE  BILIRUBINUR NEGATIVE  KETONESUR NEGATIVE  PROTEINUR NEGATIVE  NITRITE POSITIVE*  LEUKOCYTESUR SMALL*    Lipid Panel:     Component Value Date/Time   CHOL 153 04/25/2016 1050   TRIG 93.0 04/25/2016 1050   HDL 73.90 04/25/2016 1050   CHOLHDL 2 04/25/2016 1050   VLDL 18.6 04/25/2016 1050   LDLCALC 60 04/25/2016 1050    HgbA1C:  Lab Results  Component Value Date   HGBA1C  11/25/2008    6.1 (NOTE) The ADA recommends the following therapeutic goal for glycemic control related to Hgb A1c measurement: Goal of therapy: <6.5 Hgb A1c  Reference: American Diabetes Association: Clinical Practice Recommendations 2010, Diabetes Care, 2010, 33: (Suppl  1).    Urine Drug Screen:  No results found for: LABOPIA, COCAINSCRNUR, LABBENZ, AMPHETMU, THCU, LABBARB   Alcohol Level:  No results for input(s): ETH in the last 168 hours.  Miscellaneous labs:  EKG  EKG   IMAGING: Dg Chest 2 View  Result Date: 03/06/2017 CLINICAL DATA:  Altered mental status. EXAM: CHEST  2 VIEW COMPARISON:  Chest x-ray dated January 21, 2017. FINDINGS: The heart size and mediastinal contours are within normal limits. Normal pulmonary vascularity. Atherosclerotic calcification of the aortic arch. Left  basilar atelectasis/scarring in noted. No focal consolidation, pleural effusion, or pneumothorax. No acute osseous abnormality. Unchanged eventration of the right hemidiaphragm. IMPRESSION: No active cardiopulmonary disease. Electronically Signed   By: Obie Dredge M.D.   On: 03/06/2017 16:50   Ct Head Wo Contrast  Result Date: 03/06/2017 CLINICAL DATA:  Confusion. EXAM: CT HEAD WITHOUT CONTRAST TECHNIQUE: Contiguous axial images were obtained from the base of the skull through the vertex without intravenous contrast. COMPARISON:  03/16/2009 FINDINGS: Brain: There is motion artifact predominantly through the skull base. Encephalomalacia in the anterior right frontal lobe appears to have mildly progressed from 2011 and may be posttraumatic or postischemic, with motion artifact through this region mildly limiting assessment. No definite acute large territory infarct, intracranial hemorrhage, mass, midline shift, or extra-axial fluid collection is identified. Periventricular white matter hypodensities of progressed and are nonspecific but compatible with moderate chronic small vessel ischemic disease. There is moderate cerebral atrophy. Vascular: Calcified atherosclerosis at the skull base. No hyperdense vessel. Skull: No fracture or suspicious osseous lesion. Sinuses/Orbits: Visualized paranasal sinuses and mastoid air cells are grossly clear. No gross acute abnormality in the included orbits. Other: None. IMPRESSION: 1. Motion degraded examination without definite acute intracranial abnormality identified. 2. Suspected progressive encephalomalacia in the anterior right frontal lobe. 3. Progressive, moderate chronic small vessel ischemic disease and cerebral atrophy. Electronically Signed   By: Sebastian Ache M.D.   On: 03/06/2017 18:04   Mr Brain Wo Contrast  Result Date: 03/08/2017 CLINICAL DATA:  Altered level of consciousness. EXAM: MRI HEAD WITHOUT CONTRAST TECHNIQUE: Multiplanar, multiecho pulse  sequences of the brain and surrounding structures were obtained without intravenous contrast. COMPARISON:  CT head 03/06/2017 FINDINGS: Brain: 1 cm acute infarct in the left corona radiata. No other acute infarct Moderate atrophy. Chronic microvascular ischemic change in the white matter. Cystic  encephalomalacia right inferior frontal lobe appears chronic and unchanged. Negative for hemorrhage or mass. Vascular: Normal arterial flow voids Skull and upper cervical spine: Negative degenerative anterolisthesis C3-4 with mild spinal stenosis. No focal bone lesion. Sinuses/Orbits: Negative Other: Image quality degraded by mild to moderate motion IMPRESSION: 1 cm acute infarct left corona radiata Moderate atrophy and chronic ischemic change. Electronically Signed   By: Marlan Palauharles  Clark M.D.   On: 03/08/2017 10:25      Assessment: 84 y.o. female with AMS in setting of UTI, now improved. MRI incidentally shows small Lt CR infarct Stroke Risk Factors - Age, small vessel ischemic disease, HTN, HLD  # Ischemic lacunar stroke, Left CR- etiology is small vessel disease. Stroke work up ordered # HTN- normotensive goals, no role in permissive HTN at this time as stroke happened several days ago.  # HLD- lipids ordered, goal <100, if not at goal rec increasing home statin to high intensity statin (Lipitor 40mg  daily) # Dementia- advanced with debility. Per family now back to baseline.    Plan:  HgbA1c, fasting lipid panel  CTA head/neck for better vessel evaluation   PT consult, OT consult, Speech consult  Echocardiogram  ASA daily  Statin Therapy, as above  Risk factor modification  Telemetry monitoring  Frequent neuro checks  Fall Precautions  I have seen and examined the patient. Assessment and plan discussed with Neurology team.  Electronically signed: Dr. Caryl PinaEric Alisan Dokes

## 2017-03-08 NOTE — Progress Notes (Signed)
  Echocardiogram 2D Echocardiogram has been performed.  Delcie RochENNINGTON, Serrina Minogue 03/08/2017, 3:06 PM

## 2017-03-08 NOTE — Progress Notes (Addendum)
Patient ID: Bonnie Mora, female   DOB: 05/27/1926, 84 y.o.   MRN: [id number]  PROGRESS NOTE    Bonnie Mora  [id number] DOB: 05/27/1926 DOA: 03/06/2017 PCP: Myrlene Brokerrawford, Elizabeth A, MD   Brief Narrative:  84 year old female with history of advanced dementia, hypertension and anxiety presented with increasing confusion.  Patient was admitted with UTI and started on antibiotics.  MRI of the brain was also ordered.   Assessment & Plan:   Principal Problem:   Acute encephalopathy Active Problems:   Dementia   HLD (hyperlipidemia)   Essential hypertension   Acute lower UTI    1. Acute CVA: MRI done this morning shows acute infarct in the left coronary radiata.  Will call neurology.  PT/OT/SLP evaluation.  Continue aspirin.  Check lipid panel.  2D echo. 2. Acute metabolic encephalopathy -likely from stroke and UTI.    Mental status is improving.  Continue monitoring mental status  3. UTI: Urine cultures growing E. coli.  Continue Rocephin.  Follow sensitivities  4. hypertension: Blood pressure controlled.  Will monitor.  Continue metoprolol.  5. History of dementia: Monitor mental status.  Fall precautions. 6. History of anxiety: Continue citalopram.  DVT prophylaxis: Lovenox Code Status: DNR Family Communication: Spoke to patient's daughter and caregiver at bedside. Disposition Plan: May need nursing home placement.  Social worker consult  Consultants: neurology  Procedures: None  Antimicrobials:  Rocephin    Subjective: Patient seen and examined at bedside.  She is more awake today but still confused.  No overnight fever or vomiting.  Objective: Vitals:   03/07/17 0540 03/07/17 1419 03/07/17 2044 03/08/17 0437  BP: 95/72 (!) 125/59 (!) 125/51 124/79  Pulse: 72 63 60 60  Resp: 18 (!) 24 18 18   Temp: 98.1 F (36.7 C) (!) 97.5 F (36.4 C) 98 F (36.7 C) 98.3 F (36.8 C)  TempSrc: Oral Oral Oral Oral  SpO2: 96% 95%  96%  Weight:      Height:         Intake/Output Summary (Last 24 hours) at 03/08/2017 1137 Last data filed at 03/08/2017 0734 Gross per 24 hour  Intake 400 ml  Output 550 ml  Net -150 ml   Filed Weights   03/06/17 1501 03/06/17 2252  Weight: 67.1 kg (148 lb) 69.2 kg (152 lb 8.9 oz)    Examination:  General exam: Elderly female in no acute distress.  Awake but confused.   Respiratory system: Bilateral decreased breath sound at bases Cardiovascular system: S1 & S2 heard, rate controlled  gastrointestinal system: Abdomen is nondistended, soft and nontender. Normal bowel sounds heard. Central nervous system: Awake but confused.  Moving extremities.  No focal neurological deficit Extremities: No cyanosis, clubbing, edema  Psych: Intermittently tearful Skin: No rashes, ulcers  Data Reviewed: I have personally reviewed following labs and imaging studies  CBC: Recent Labs  Lab 03/06/17 1505 03/07/17 0906 03/08/17 0544  WBC 5.9 6.0 6.0  NEUTROABS  --  3.7 3.5  HGB 13.2 11.8* 12.0  HCT 41.8 37.6 38.1  MCV 93.3 93.5 93.2  PLT 221 207 210   Basic Metabolic Panel: Recent Labs  Lab 03/06/17 1505 03/07/17 0906 03/08/17 0544  NA 141 140 142  K 4.0 4.0 4.6  CL 106 106 108  CO2 22 24 23   GLUCOSE 169* 108* 108*  BUN 19 15 18   CREATININE 0.79 0.74 0.78  CALCIUM 9.2 8.9 8.9  MG  --  1.9 2.0   GFR: Estimated Creatinine Clearance: 42.2 mL/min (  by C-G formula based on SCr of 0.78 mg/dL). Liver Function Tests: Recent Labs  Lab 03/06/17 1505 03/07/17 0906  AST 25 21  ALT 18 15  ALKPHOS 69 66  BILITOT 0.7 0.6  PROT 6.7 6.1*  ALBUMIN 3.5 3.1*   No results for input(s): LIPASE, AMYLASE in the last 168 hours. No results for input(s): AMMONIA in the last 168 hours. Coagulation Profile: No results for input(s): INR, PROTIME in the last 168 hours. Cardiac Enzymes: No results for input(s): CKTOTAL, CKMB, CKMBINDEX, TROPONINI in the last 168 hours. BNP (last 3 results) No results for input(s): PROBNP in  the last 8760 hours. HbA1C: No results for input(s): HGBA1C in the last 72 hours. CBG: Recent Labs  Lab 03/06/17 1531  GLUCAP 140*   Lipid Profile: No results for input(s): CHOL, HDL, LDLCALC, TRIG, CHOLHDL, LDLDIRECT in the last 72 hours. Thyroid Function Tests: No results for input(s): TSH, T4TOTAL, FREET4, T3FREE, THYROIDAB in the last 72 hours. Anemia Panel: No results for input(s): VITAMINB12, FOLATE, FERRITIN, TIBC, IRON, RETICCTPCT in the last 72 hours. Sepsis Labs: No results for input(s): PROCALCITON, LATICACIDVEN in the last 168 hours.  Recent Results (from the past 240 hour(s))  Urine culture     Status: Abnormal   Collection Time: 03/06/17  4:15 PM  Result Value Ref Range Status   Specimen Description URINE, RANDOM  Final   Special Requests NONE  Final   Culture (A)  Final    >=100,000 COLONIES/mL ESCHERICHIA COLI Confirmed Extended Spectrum Beta-Lactamase Producer (ESBL).  In bloodstream infections from ESBL organisms, carbapenems are preferred over piperacillin/tazobactam. They are shown to have a lower risk of mortality. Performed at [facility], [address], [place], [place] [address]    Report Status 03/08/2017 FINAL  Final   Organism ID, Bacteria ESCHERICHIA COLI (A)  Final      Susceptibility   Escherichia coli - MIC*    AMPICILLIN >=32 RESISTANT Resistant     CEFAZOLIN >=64 RESISTANT Resistant     CEFTRIAXONE >=64 RESISTANT Resistant     CIPROFLOXACIN >=4 RESISTANT Resistant     GENTAMICIN <=1 SENSITIVE Sensitive     IMIPENEM <=0.25 SENSITIVE Sensitive     NITROFURANTOIN 64 INTERMEDIATE Intermediate     TRIMETH/SULFA >=320 RESISTANT Resistant     AMPICILLIN/SULBACTAM >=32 RESISTANT Resistant     PIP/TAZO 64 INTERMEDIATE Intermediate     Extended ESBL POSITIVE Resistant     * >=100,000 COLONIES/mL ESCHERICHIA COLI         Radiology Studies: Dg Chest 2 View  Result Date: 03/06/2017 CLINICAL DATA:  Altered mental status. EXAM: CHEST  2  VIEW COMPARISON:  Chest x-ray dated January 21, 2017. FINDINGS: The heart size and mediastinal contours are within normal limits. Normal pulmonary vascularity. Atherosclerotic calcification of the aortic arch. Left basilar atelectasis/scarring in noted. No focal consolidation, pleural effusion, or pneumothorax. No acute osseous abnormality. Unchanged eventration of the right hemidiaphragm. IMPRESSION: No active cardiopulmonary disease. Electronically Signed   By: Obie Dredge M.D.   On: 03/06/2017 16:50   Ct Head Wo Contrast  Result Date: 03/06/2017 CLINICAL DATA:  Confusion. EXAM: CT HEAD WITHOUT CONTRAST TECHNIQUE: Contiguous axial images were obtained from the base of the skull through the vertex without intravenous contrast. COMPARISON:  03/16/2009 FINDINGS: Brain: There is motion artifact predominantly through the skull base. Encephalomalacia in the anterior right frontal lobe appears to have mildly progressed from 2011 and may be posttraumatic or postischemic, with motion artifact through this region  mildly limiting assessment. No definite acute large territory infarct, intracranial hemorrhage, mass, midline shift, or extra-axial fluid collection is identified. Periventricular white matter hypodensities of progressed and are nonspecific but compatible with moderate chronic small vessel ischemic disease. There is moderate cerebral atrophy. Vascular: Calcified atherosclerosis at the skull base. No hyperdense vessel. Skull: No fracture or suspicious osseous lesion. Sinuses/Orbits: Visualized paranasal sinuses and mastoid air cells are grossly clear. No gross acute abnormality in the included orbits. Other: None. IMPRESSION: 1. Motion degraded examination without definite acute intracranial abnormality identified. 2. Suspected progressive encephalomalacia in the anterior right frontal lobe. 3. Progressive, moderate chronic small vessel ischemic disease and cerebral atrophy. Electronically Signed   By: Sebastian Ache M.D.   On: 03/06/2017 18:04   Mr Brain Wo Contrast  Result Date: 03/08/2017 CLINICAL DATA:  Altered level of consciousness. EXAM: MRI HEAD WITHOUT CONTRAST TECHNIQUE: Multiplanar, multiecho pulse sequences of the brain and surrounding structures were obtained without intravenous contrast. COMPARISON:  CT head 03/06/2017 FINDINGS: Brain: 1 cm acute infarct in the left corona radiata. No other acute infarct Moderate atrophy. Chronic microvascular ischemic change in the white matter. Cystic encephalomalacia right inferior frontal lobe appears chronic and unchanged. Negative for hemorrhage or mass. Vascular: Normal arterial flow voids Skull and upper cervical spine: Negative degenerative anterolisthesis C3-4 with mild spinal stenosis. No focal bone lesion. Sinuses/Orbits: Negative Other: Image quality degraded by mild to moderate motion IMPRESSION: 1 cm acute infarct left corona radiata Moderate atrophy and chronic ischemic change. Electronically Signed   By: Marlan Palau M.D.   On: 03/08/2017 10:25        Scheduled Meds: . aspirin  300 mg Rectal Daily   Or  . aspirin  325 mg Oral Daily  . atorvastatin  20 mg Oral QHS  . citalopram  20 mg Oral Daily  . enoxaparin (LOVENOX) injection  40 mg Subcutaneous Q24H  . LORazepam  1 mg Intravenous Once  . metoprolol succinate  50 mg Oral Q breakfast  . traZODone  100 mg Oral QHS   Continuous Infusions: . cefTRIAXone (ROCEPHIN)  IV Stopped (03/07/17 2355)     LOS: 1 day        [name], MD [facility] Pager [phone]  If 7PM-7AM, please contact night-coverage [email or web address] Password [facility] 03/08/2017, 11:37 AM

## 2017-03-08 NOTE — Progress Notes (Signed)
CSW spoke with pt's daughter at bedside.  Pt has dementia and daughter would like for CSW to speak with pt's son Carroll SageGreg Parisien [phone] [facility]) concerning Memory Care Placement.  Pt's daughter reported that a mother and daughter were taking care of her mother.    Budd Palmerara Nilan Iddings LCSWA [phone]

## 2017-03-09 ENCOUNTER — Inpatient Hospital Stay (HOSPITAL_COMMUNITY): Payer: Medicare Other

## 2017-03-09 DIAGNOSIS — G9340 Encephalopathy, unspecified: Secondary | ICD-10-CM

## 2017-03-09 DIAGNOSIS — R4182 Altered mental status, unspecified: Secondary | ICD-10-CM

## 2017-03-09 LAB — LIPID PANEL
CHOL/HDL RATIO: 2.7 ratio
CHOLESTEROL: 139 mg/dL (ref 0–200)
HDL: 51 mg/dL (ref >40)
LDL Cholesterol: 56 mg/dL (ref 0–99)
TRIGLYCERIDES: 159 mg/dL — AB (ref <150)
VLDL: 32 mg/dL (ref 0–40)

## 2017-03-09 LAB — CBC WITH DIFFERENTIAL/PLATELET
BASOS ABS: 0 10*3/uL (ref 0.0–0.1)
BASOS PCT: 0 %
Eosinophils Absolute: 0.2 10*3/uL (ref 0.0–0.7)
Eosinophils Relative: 2 %
HCT: 41.5 % (ref 36.0–46.0)
Hemoglobin: 13.2 g/dL (ref 12.0–15.0)
Lymphocytes Relative: 28 %
Lymphs Abs: 1.8 10*3/uL (ref 0.7–4.0)
MCH: 29.7 pg (ref 26.0–34.0)
MCHC: 31.8 g/dL (ref 30.0–36.0)
MCV: 93.3 fL (ref 78.0–100.0)
MONO ABS: 0.6 10*3/uL (ref 0.1–1.0)
Monocytes Relative: 10 %
NEUTROS PCT: 60 %
Neutro Abs: 3.7 10*3/uL (ref 1.7–7.7)
Platelets: 219 10*3/uL (ref 150–400)
RBC: 4.45 MIL/uL (ref 3.87–5.11)
RDW: 14.2 % (ref 11.5–15.5)
WBC: 6.3 10*3/uL (ref 4.0–10.5)

## 2017-03-09 LAB — BASIC METABOLIC PANEL
ANION GAP: 11 (ref 5–15)
BUN: 13 mg/dL (ref 6–20)
CALCIUM: 9.1 mg/dL (ref 8.9–10.3)
CO2: 26 mmol/L (ref 22–32)
Chloride: 104 mmol/L (ref 101–111)
Creatinine, Ser: 0.77 mg/dL (ref 0.44–1.00)
GLUCOSE: 108 mg/dL — AB (ref 65–99)
Potassium: 3.9 mmol/L (ref 3.5–5.1)
SODIUM: 141 mmol/L (ref 135–145)

## 2017-03-09 LAB — MAGNESIUM: Magnesium: 2 mg/dL (ref 1.7–2.4)

## 2017-03-09 MED ORDER — POLYETHYLENE GLYCOL 3350 17 G PO PACK
17.0000 g | PACK | Freq: Every day | ORAL | Status: DC | PRN
  Administered 2017-03-09: 17 g via ORAL
  Filled 2017-03-09: qty 1

## 2017-03-09 NOTE — Clinical Social Work Note (Signed)
Clinical Social Work Assessment  Patient Details  Name: Bonnie Mora MRN: [id number] Date of Birth: 04/28/1926  Date of referral:  03/09/17               Reason for consult:  Facility Placement                Permission sought to share information with:  Facility Medical sales representativeContact Representative, Family Supports Permission granted to share information::  No  Name::     Bonnie Mora  Agency::  [facility]NF[facility]  Relationship::  Son  Contact Information:  [phone]  Housing/Transportation Living arrangements for the past 2 months:  Single Family Home Source of Information:  Adult Children Patient Interpreter Needed:  None Criminal Activity/Legal Involvement Pertinent to Current Situation/Hospitalization:  No - Comment as needed Significant Relationships:  Adult Children Lives with:  Other (Comment)(Caregiver-Bonnie) Do you feel safe going back to the place where you live?  Yes Need for family participation in patient care:  Yes (Comment)  Care giving concerns:  CSW received consult for possible SNF placement at time of discharge. CSW spoke with patient's son and daughter regarding PT recommendation of SNF placement at time of discharge. Patient's son reported that patient has caregivers with her at home Bonnie Purpura(Bonnie Mora) but they are unable to handle IV antibiotics. Patient's son expressed understanding of PT recommendation and is agreeable to SNF placement at time of discharge. CSW to continue to follow and assist with discharge planning needs.   Social Worker assessment / plan:  CSW spoke with patient's son concerning [facility] before returning [facility].  Employment status:  Retired Database administratornsurance information:  Managed Medicare PT Recommendations:  Skilled Nursing Facility Information / Referral to community resources:  Skilled Nursing Facility  Patient/Family's Response to care:  Patient's son recognizes need for rehab before returning home and [place] Patient reported  preference for [name].  Patient/Family's Understanding of and Emotional Response to Diagnosis, Current Treatment, and Prognosis:  Patient/family is realistic regarding therapy needs and expressed being hopeful for SNF placement. Patient's son expressed understanding of CSW role and discharge process as well as medical condition. No questions/concerns about plan or treatment.    Emotional Assessment Appearance:  Appears stated age Attitude/Demeanor/Rapport:  Unable to Assess Affect (typically observed):  Unable to Assess Orientation:  Oriented to Self Alcohol / Substance use:  Not Applicable Psych involvement (Current and /or in the community):  No (Comment)  Discharge Needs  Concerns to be addressed:  Care Coordination Readmission within the last 30 days:  No Current discharge risk:  None Barriers to Discharge:  Continued Medical Work up   [employer] S Bonnie Mora, LCSWA 03/09/2017, 5:47 PM

## 2017-03-09 NOTE — Progress Notes (Signed)
NEUROHOSPITALISTS STROKE TEAM - DAILY PROGRESS NOTE   ADMISSION HISTORY: Bonnie Mora is an 84 y.o. female who has PD, HTN, HLD and advanced dementia, baseline mRS 3-4, who has full time 24/7 caregivers at home. She presented to the ER on 2/15 with AMS and some associated word finding difficulty. There were no focal deficits reported at that time. She has had this in the past when diagnosed with UTIs. UA suggests a possible UTI, but brain MRI was also done as part of work up by primary team. This showed a small subcortical stroke in the left [facility]. She has considerable small vessel disease. Neurology was consulted for this new finding of stroke. Upon exam NIH is 2, but score is increased not for clinical findings of stroke, but rather for her dementia, which is back to baseline now per family.   NIHSS 1a Level of Conscious:0 1b LOC Questions: 2 1c LOC Commands:0  2 Best Gaze: 0 3 Visual: 0 4 Facial Palsy:0  5a Motor Arm - left:0  5b Motor Arm - Right:0  6a Motor Leg - Left: 0 6b Motor Leg - Right: 0 7 Limb Ataxia: 0 8 Sensory: 0 9 Best Language: 0 10 Dysarthria:0 11 Extinct. and Inattention:0 TOTAL: 2  SUBJECTIVE (INTERVAL HISTORY) Son is at the bedside. Patient is found laying in bed in NAD. Overall he feels her condition is rapidly improving. Voices no new complaints. No new events reported overnight.   OBJECTIVE Lab Results: CBC:  Recent Labs  Lab 03/07/17 0906 03/08/17 0544 03/09/17 0356  WBC 6.0 6.0 6.3  HGB 11.8* 12.0 13.2  HCT 37.6 38.1 41.5  MCV 93.5 93.2 93.3  PLT 207 210 219   BMP: Recent Labs  Lab 03/06/17 1505 03/07/17 0906 03/08/17 0544 03/09/17 0356  NA 141 140 142 141  K 4.0 4.0 4.6 3.9  CL 106 106 108 104  CO2 22 24 23 26   GLUCOSE 169* 108* 108* 108*  BUN 19 15 18 13   CREATININE 0.79 0.74 0.78 0.77  CALCIUM 9.2 8.9 8.9 9.1  MG  --  1.9 2.0 2.0   Liver Function Tests:  Recent Labs   Lab 03/06/17 1505 03/07/17 0906  AST 25 21  ALT 18 15  ALKPHOS 69 66  BILITOT 0.7 0.6  PROT 6.7 6.1*  ALBUMIN 3.5 3.1*   Microbiology:  +EColi - urine Blood Cx - NGTF  Urinalysis:  Recent Labs  Lab 03/06/17 1618  COLORURINE YELLOW  APPEARANCEUR HAZY*  LABSPEC 1.024  PHURINE 5.0  GLUCOSEU NEGATIVE  HGBUR NEGATIVE  BILIRUBINUR NEGATIVE  KETONESUR NEGATIVE  PROTEINUR NEGATIVE  NITRITE POSITIVE*  LEUKOCYTESUR SMALL*   PHYSICAL EXAM Temp:  [97.7 F (36.5 C)-98 F (36.7 C)] 98 F (36.7 C) (02/18 1305) Pulse Rate:  [58-77] 71 (02/18 1305) Resp:  [19-24] 19 (02/18 1305) BP: (141-180)/(48-129) 164/87 (02/18 1305) SpO2:  [91 %-100 %] 96 % (02/18 1305) General - Well nourished, well developed, in no apparent distress HEENT-  Normocephalic,  Cardiovascular - Regular rate and rhythm  Respiratory - Lungs clear bilaterally. No wheezing. Abdomen - soft and non-tender, BS normal Extremities- no edema or cyanosis Mental Status: Alert, pleasantly confused. Able to tell me her full name, her DOB, but incorrect age. She tells me its April, makes jokes about what year it may be, but cannot give any answer. She is able to choose from a list of options as to place as "hospital". Speech fluent without evidence of aphasia. Able to name  some common items. No dysarthria. Able to follow 3 step commands if assisted with cues.  Cranial Nerves: Face symmetric, with normal sensation. Tongue is midline. Visual fields grossly normal, PERRLA, EOMI. Shrugs equal.  Motor: Right : Upper extremity   5/5    Left:     Upper extremity   5/5  Lower extremity   5/5     Lower extremity   5/5 Tone and bulk:normal tone throughout Sensory: Light touch intact throughout, bilaterally Deep Tendon Reflexes: 2+ and symmetric throughout Plantars: mute Cerebellar: normal finger-to-nose and normal heel-to-shin test Gait: deferred at this time.  IMAGING: I have personally reviewed the radiological images below  and agree with the radiology interpretations.  Ct Angio Head/Neck W Or Wo Contrast Result Date: 03/08/2017  IMPRESSION: 1. No emergent large vessel occlusion or flow-limiting stenosis. 2. Bilateral carotid bifurcation and intracranial ICA atherosclerotic calcification without significant stenosis by NASCET criteria. 3.  Aortic Atherosclerosis (ICD10-I70.0). 4. Right frontal encephalomalacia and sequelae of chronic ischemic microangiopathy. Electronically Signed   By: Deatra RobinsonKevin  Herman M.D.   On: 03/08/2017 23:21   Mr Brain Wo Contrast Result Date: 03/08/2017 IMPRESSION: 1 cm acute infarct left corona radiata Moderate atrophy and chronic ischemic change. Electronically Signed   By: Marlan Palauharles  Clark M.D.   On: 03/08/2017 10:25   Echocardiogram:                                               Study Conclusions  - Left ventricle: The cavity size was normal. Wall thickness was   increased in a pattern of mild LVH. Systolic function was   vigorous. The estimated ejection fraction was in the range of 65%   to 70%. Wall motion was normal; there were no regional wall   motion abnormalities. Doppler parameters are consistent with   abnormal left ventricular relaxation (grade 1 diastolic   dysfunction). The E/e&' ratio is <8, suggesting normal LV filling   pressure. - Aortic valve: Sclerosis without stenosis. There was no   regurgitation. - Mitral valve: Mildly thickened leaflets . There was trivial   regurgitation. - Left atrium: The atrium was normal in size. - Inferior vena cava: The vessel was normal in size. The   respirophasic diameter changes were in the normal range (>= 50%),   consistent with normal central venous pressure.  Impressions: - Compared to a prior study in 2010, the LVEF is higher at 65-70%,   mildLVH, normal wall motion, grade 1 DD and normal LV filling   pressure.     IMPRESSION: Ms. Bonnie Mora is a 84 y.o. female with PMH of PD, HTN, HLD and advanced dementia, baseline  mRS 3-4, who has full time 24/7 caregivers at home. She presented to the ER on 2/15 with AMS and some associated word finding difficulty in setting of UTI, now improved. MRI reveals:  1 cm acute infarct left corona radiata   Suspected Etiology: small vessel disease Resultant Symptoms: AMS and word finding difficulty Stroke Risk Factors: diabetes mellitus, hyperlipidemia and hypertension Other Stroke Risk Factors: Advanced age,   Outstanding Stroke Work-up Studies:    Work up is completed  03/09/2017 ASSESSMENT:   Neuro exam is non-focal. Patient is likely at her mentation baseline. Stroke work up is completed. ASA added and continue Lipitor. Neurology follow up in 6 weeks  PLAN  03/09/2017: Continue Aspirin/ Statin  Frequent neuro checks Telemetry monitoring PT/OT/SLP Consult PM & Rehab Consult Case Management /MSW Ongoing aggressive stroke risk factor management Patient's family counseled to be compliant with her antithrombotic medications Patient's family counseled on Lifestyle modifications including, Diet, Exercise, and Stress Follow up with [facility] in 6 weeks  HYPERTENSION: Stable Permissive hypertension (OK if <220/120) for 24-48 hours post stroke and then gradually normalized within 5-7 days. Long term BP goal normotensive. May slowly restart home B/P medications after 48 hours Home Meds: Metroprolol  HYPERLIPIDEMIA:    Component Value Date/Time   CHOL 139 03/09/2017 0356   TRIG 159 (H) 03/09/2017 0356   HDL 51 03/09/2017 0356   CHOLHDL 2.7 03/09/2017 0356   VLDL 32 03/09/2017 0356   LDLCALC 56 03/09/2017 0356   Home Meds:  Lipitor 20 mg LDL  goal < 70 Continued on  Lipitor to 20 mg daily Continue statin at discharge  DIABETES: Lab Results  Component Value Date   HGBA1C 6.3 (H) 03/08/2017  HgbA1c goal < 7.0 Continue CBG monitoring and SSI to maintain glucose 140-180 mg/dl DM education   Other Active Problems: Principal Problem:   Acute  encephalopathy Active Problems:   Dementia   HLD (hyperlipidemia)   Essential hypertension   Acute lower UTI   Cerebral thrombosis with cerebral infarction    Hospital day # 2 VTE prophylaxis: Lovenox  Diet : Fall precautions Diet Heart Room service appropriate? Yes; Fluid consistency: Thin   FAMILY UPDATES: family at bedside  TEAM UPDATES: [name], MD   Prior Home Stroke Medications:  No antithrombotic  Discharge Stroke Meds:  Please discharge patient on aspirin 325 mg daily   Disposition: 01-Home or Self Care Therapy Recs:               PENDING Follow Up:  Follow-up Information    Nilda Riggs, NP. Schedule an appointment as soon as possible for a visit in 6 week(s).   Specialty:  Family Medicine Contact information: [address] [place] [address] [phone]          [name], MD -PCP Follow up in 1-2 weeks      Assessment & plan discussed with with attending physician and they are in agreement.    Beryl Meager, ANP-C Stroke Neurology Team 03/09/2017 3:34 PM I have personally examined this patient, reviewed notes, independently viewed imaging studies, participated in medical decision making and plan of care.ROS completed by me personally and pertinent positives fully documented  I have made any additions or clarifications directly to the above note. Agree with note above. She has presented with a small left subcortical lacunar infarct due to small vessel disease. Agree with aspirin and aggressive risk factor modification. Long discussion with the patient and son and answered questions. Follow-up as an outpatient in stroke clinic in 6 weeks. Greater than 50% time during this 25 minute visit was spent on counseling and coordination of care about lacunar infarct and stroke risk prevention and written discussion and answering questions. Neurology to sign-off at this time. Please call with any further questions or concerns.  Thank you for this consultation. Delia Heady, MD Medical Director [facility] Pager: [phone] 03/09/2017 4:19 PM    To contact Stroke Continuity provider, please refer to [email or web address]. After hours, contact General Neurology

## 2017-03-09 NOTE — [place] FL2 (Signed)
[place] MEDICAID FL2 LEVEL OF CARE SCREENING TOOL     IDENTIFICATION  Patient Name: Bonnie Mora Birthdate: 1926-02-22 Sex: female Admission Date (Current Location): 03/06/2017  [facility] [place]   Facility and Address:  [facility], [address], [place], [place] [address]      Provider Number: [id number]  Attending Physician Name and Address:  Glade LloydAlekh, Kshitiz, MD  Relative Name and Phone Number:  Tammy SoursGreg, son, [phone]    Current Level of Care: Hospital Recommended Level of Care: Skilled Nursing Facility Prior Approval Number:    Date Approved/Denied:   PASRR Number: [id number]  Discharge Plan: SNF    Current Diagnoses: Patient Active Problem List   Diagnosis Date Noted  . Cerebral thrombosis with cerebral infarction 03/08/2017  . Acute encephalopathy 03/06/2017  . Acute lower UTI 03/06/2017  . Chronic left shoulder pain 08/19/2016  . Routine general medical examination at a health care facility 04/25/2016  . Diarrhea 11/03/2014  . Dementia 03/10/2014  . HLD (hyperlipidemia) 03/10/2014  . Essential hypertension 03/10/2014    Orientation RESPIRATION BLADDER Height & Weight     Self  Normal Continent Weight: 69.2 kg (152 lb 8.9 oz) Height:  5' (152.4 cm)  BEHAVIORAL SYMPTOMS/MOOD NEUROLOGICAL BOWEL NUTRITION STATUS      Continent Diet(Please see DC Summary)  AMBULATORY STATUS COMMUNICATION OF NEEDS Skin   Limited Assist Verbally Normal                       Personal Care Assistance Level of Assistance  Bathing, Feeding, Dressing Bathing Assistance: Limited assistance Feeding assistance: Limited assistance Dressing Assistance: Limited assistance     Functional Limitations Info             SPECIAL CARE FACTORS FREQUENCY  PT (By licensed PT)     PT Frequency: 5x/week              Contractures      Additional Factors Info  Code Status, Allergies, Psychotropic, Isolation Precautions  Code Status Info: DNR Allergies Info: Codeine Psychotropic Info: Trazadone   Isolation Precautions Info: ESBL     Current Medications (03/09/2017):  This is the current hospital active medication list Current Facility-Administered Medications  Medication Dose Route Frequency Provider Last Rate Last Dose  . acetaminophen (TYLENOL) tablet 650 mg  650 mg Oral Q4H PRN Eduard ClosKakrakandy, Arshad N, MD       Or  . acetaminophen (TYLENOL) solution 650 mg  650 mg Per Tube Q4H PRN Eduard ClosKakrakandy, Arshad N, MD       Or  . acetaminophen (TYLENOL) suppository 650 mg  650 mg Rectal Q4H PRN Eduard ClosKakrakandy, Arshad N, MD      . albuterol (PROVENTIL) (2.5 MG/3ML) 0.083% nebulizer solution 3 mL  3 mL Inhalation Q6H PRN Eduard ClosKakrakandy, Arshad N, MD      . aspirin suppository 300 mg  300 mg Rectal Daily Eduard ClosKakrakandy, Arshad N, MD       Or  . aspirin tablet 325 mg  325 mg Oral Daily Eduard ClosKakrakandy, Arshad N, MD   325 mg at 03/09/17 1011  . atorvastatin (LIPITOR) tablet 20 mg  20 mg Oral QHS Eduard ClosKakrakandy, Arshad N, MD   20 mg at 03/08/17 2309  . citalopram (CELEXA) tablet 20 mg  20 mg Oral Daily Eduard ClosKakrakandy, Arshad N, MD   20 mg at 03/09/17 1011  . enoxaparin (LOVENOX) injection 40 mg  40 mg Subcutaneous Q24H Eduard ClosKakrakandy, Arshad N, MD  40 mg at 03/09/17 1011  . LORazepam (ATIVAN) injection 1 mg  1 mg Intravenous Once Bodenheimer, Charles A, NP      . LORazepam (ATIVAN) tablet 1 mg  1 mg Oral Daily PRN Eduard Clos, MD   1 mg at 03/08/17 0834  . meropenem (MERREM) 1 g in sodium chloride 0.9 % 100 mL IVPB  1 g Intravenous Q12H Bajbus, Lauren D, RPH   Stopped at 03/09/17 0230  . metoprolol succinate (TOPROL-XL) 24 hr tablet 50 mg  50 mg Oral Q breakfast Eduard Clos, MD   50 mg at 03/09/17 1011  . ondansetron (ZOFRAN) injection 4 mg  4 mg Intravenous Q6H PRN Bodenheimer, Charles A, NP      . polyethylene glycol (MIRALAX / GLYCOLAX) packet 17 g  17 g Oral Daily PRN [name], MD   17 g at 03/09/17 1016  . traZODone  (DESYREL) tablet 100 mg  100 mg Oral QHS Eduard Clos, MD   100 mg at 03/08/17 2308     Discharge Medications: Please see discharge summary for a list of discharge medications.  Relevant Imaging Results:  Relevant Lab Results:   Additional Information SSN: [id number] [place], [place]

## 2017-03-09 NOTE — Care Management Note (Signed)
Case Management Note  Patient Details  Name: Bonnie Mora MRN: [id number] Date of Birth: [birth year]  Subjective/Objective:   Acute Encephalopathy, CVA                Action/Plan: Patient lives at home; PCP: Myrlene Brokerrawford, Elizabeth A, MD; has [facility] with prescription drug coverage; Noted SW following for possible SNF placement; awaiting Physical Therapy eval for disposition needs. CM will continue to follow for progression of care.   Expected Discharge Date:     Possibly 03/12/2017             Expected Discharge Plan:  Skilled Nursing Facility  Discharge planning Services  CM Consult  Status of Service:  In process, will continue to follow:  Reola MosherChandler, Zedrick Springsteen L, RN,MHA,BSN [phone] 03/09/2017, 10:00 AM

## 2017-03-09 NOTE — Progress Notes (Signed)
[name] able to accept patient on IV merrem. They have received auth for patient to go there for a week. Left vm for son.   Osborne Cascoadia Charlii Yost LCSW [phone]

## 2017-03-09 NOTE — Progress Notes (Signed)
Patient ID: Bonnie Mora, female   DOB: August 18, 1926, 84 y.o.   MRN: [id number]  PROGRESS NOTE    Bonnie Mora  WUJ:[id number] DOB: 1926-10-30 DOA: 03/06/2017 PCP: [name], MD   Brief Narrative:  84 year old female with history of advanced dementia, hypertension and anxiety presented with increasing confusion.  Patient was admitted with UTI and started on antibiotics.  MRI of the brain was also ordered.   Assessment & Plan:   Principal Problem:   Acute encephalopathy Active Problems:   Dementia   HLD (hyperlipidemia)   Essential hypertension   Acute lower UTI   Cerebral thrombosis with cerebral infarction    1. Acute CVA: Neurology evaluation appreciated. Continue aspirin and statin.  LDL 56.  Echo showed EF of 65-70% with grade 1 diastolic dysfunction.  PT recommends nursing home placement. 2.  Acute metabolic encephalopathy -likely from stroke and UTI.    Mental status is probably back to baseline.  Continue monitoring mental status  3. UTI: Urine cultures growing ESBL E. coli.  Rocephin has been changed to meropenem.  Continue carbapenem for at least 5 days 4. hypertension: Blood pressure controlled.  Will monitor.  Continue metoprolol.  5. History of dementia: Monitor mental status.  Fall precautions. 6. History of anxiety: Continue citalopram.  DVT prophylaxis: Lovenox Code Status: DNR Family Communication: Spoke to patient's son and caregiver at bedside. Disposition Plan: Nursing home once available  Consultants: neurology  Procedures:  Study Conclusions  - Left ventricle: The cavity size was normal. Wall thickness was   increased in a pattern of mild LVH. Systolic function was   vigorous. The estimated ejection fraction was in the range of 65%   to 70%. Wall motion was normal; there were no regional wall   motion abnormalities. Doppler parameters are consistent with   abnormal left ventricular relaxation (grade 1 diastolic   dysfunction). The E/e&'  ratio is <8, suggesting normal LV filling   pressure. - Aortic valve: Sclerosis without stenosis. There was no   regurgitation. - Mitral valve: Mildly thickened leaflets . There was trivial   regurgitation. - Left atrium: The atrium was normal in size. - Inferior vena cava: The vessel was normal in size. The   respirophasic diameter changes were in the normal range (>= 50%),   consistent with normal central venous pressure.  Impressions:  - Compared to a prior study in 2010, the LVEF is higher at 65-70%,   mildLVH, normal wall motion, grade 1 DD and normal LV filling   pressure.  Antimicrobials:  Rocephin Meropenem from 03/08/2017 onwards  Subjective: Patient seen and examined at bedside.  She is awake, sitting on chair but confused.  No overnight fever or vomiting.  She is tolerating diet  Objective: Vitals:   03/09/17 0638 03/09/17 1010 03/09/17 1011 03/09/17 1305  BP: (!) 144/119 (!) 150/48  (!) 164/87  Pulse: (!) 58  64 71  Resp:    19  Temp: 97.7 F (36.5 C)   98 F (36.7 C)  TempSrc: Axillary   Oral  SpO2: 100%   96%  Weight:      Height:        Intake/Output Summary (Last 24 hours) at 03/09/2017 1322 Last data filed at 03/09/2017 1045 Gross per 24 hour  Intake 220 ml  Output 1750 ml  Net -1530 ml   Filed Weights   03/06/17 1501 03/06/17 2252  Weight: 67.1 kg (148 lb) 69.2 kg (152 lb 8.9 oz)    Examination:  General exam: Elderly female in no acute distress.  Awake but confused.   Respiratory system: Bilateral decreased breath sounds at bases Cardiovascular system: S1 & S2 heard, rate controlled  gastrointestinal system: Abdomen is nondistended, soft and nontender. Normal bowel sounds heard. Extremities: No cyanosis, clubbing, edema   Data Reviewed: I have personally reviewed following labs and imaging studies  CBC: Recent Labs  Lab 03/06/17 1505 03/07/17 0906 03/08/17 0544 03/09/17 0356  WBC 5.9 6.0 6.0 6.3  NEUTROABS  --  3.7 3.5 3.7  HGB  13.2 11.8* 12.0 13.2  HCT 41.8 37.6 38.1 41.5  MCV 93.3 93.5 93.2 93.3  PLT 221 207 210 219   Basic Metabolic Panel: Recent Labs  Lab 03/06/17 1505 03/07/17 0906 03/08/17 0544 03/09/17 0356  NA 141 140 142 141  K 4.0 4.0 4.6 3.9  CL 106 106 108 104  CO2 22 24 23 26   GLUCOSE 169* 108* 108* 108*  BUN 19 15 18 13   CREATININE 0.79 0.74 0.78 0.77  CALCIUM 9.2 8.9 8.9 9.1  MG  --  1.9 2.0 2.0   GFR: Estimated Creatinine Clearance: 42.2 mL/min (by C-G formula based on SCr of 0.77 mg/dL). Liver Function Tests: Recent Labs  Lab 03/06/17 1505 03/07/17 0906  AST 25 21  ALT 18 15  ALKPHOS 69 66  BILITOT 0.7 0.6  PROT 6.7 6.1*  ALBUMIN 3.5 3.1*   No results for input(s): LIPASE, AMYLASE in the last 168 hours. No results for input(s): AMMONIA in the last 168 hours. Coagulation Profile: No results for input(s): INR, PROTIME in the last 168 hours. Cardiac Enzymes: No results for input(s): CKTOTAL, CKMB, CKMBINDEX, TROPONINI in the last 168 hours. BNP (last 3 results) No results for input(s): PROBNP in the last 8760 hours. HbA1C: Recent Labs    03/08/17 1537  HGBA1C 6.3*   CBG: Recent Labs  Lab 03/06/17 1531  GLUCAP 140*   Lipid Profile: Recent Labs    03/08/17 1537 03/09/17 0356  CHOL 138 139  HDL 56 51  LDLCALC 59 56  TRIG 115 159*  CHOLHDL 2.5 2.7   Thyroid Function Tests: No results for input(s): TSH, T4TOTAL, FREET4, T3FREE, THYROIDAB in the last 72 hours. Anemia Panel: No results for input(s): VITAMINB12, FOLATE, FERRITIN, TIBC, IRON, RETICCTPCT in the last 72 hours. Sepsis Labs: No results for input(s): PROCALCITON, LATICACIDVEN in the last 168 hours.  Recent Results (from the past 240 hour(s))  Urine culture     Status: Abnormal   Collection Time: 03/06/17  4:15 PM  Result Value Ref Range Status   Specimen Description URINE, RANDOM  Final   Special Requests NONE  Final   Culture (A)  Final    >=100,000 COLONIES/mL ESCHERICHIA COLI Confirmed  Extended Spectrum Beta-Lactamase Producer (ESBL).  In bloodstream infections from ESBL organisms, carbapenems are preferred over piperacillin/tazobactam. They are shown to have a lower risk of mortality. Performed at [facility], [address] [place], [place] [address]    Report Status 03/08/2017 FINAL  Final   Organism ID, Bacteria ESCHERICHIA COLI (A)  Final      Susceptibility   Escherichia coli - MIC*    AMPICILLIN >=32 RESISTANT Resistant     CEFAZOLIN >=64 RESISTANT Resistant     CEFTRIAXONE >=64 RESISTANT Resistant     CIPROFLOXACIN >=4 RESISTANT Resistant     GENTAMICIN <=1 SENSITIVE Sensitive     IMIPENEM <=0.25 SENSITIVE Sensitive     NITROFURANTOIN 64 INTERMEDIATE Intermediate  TRIMETH/SULFA >=320 RESISTANT Resistant     AMPICILLIN/SULBACTAM >=32 RESISTANT Resistant     PIP/TAZO 64 INTERMEDIATE Intermediate     Extended ESBL POSITIVE Resistant     * >=100,000 COLONIES/mL ESCHERICHIA COLI         Radiology Studies: Ct Angio Head W Or Wo Contrast  Result Date: 03/08/2017 CLINICAL DATA:  Stroke follow-up EXAM: CT ANGIOGRAPHY HEAD AND NECK TECHNIQUE: Multidetector CT imaging of the head and neck was performed using the standard protocol during bolus administration of intravenous contrast. Multiplanar CT image reconstructions and MIPs were obtained to evaluate the vascular anatomy. Carotid stenosis measurements (when applicable) are obtained utilizing NASCET criteria, using the distal internal carotid diameter as the denominator. CONTRAST:  50mL ISOVUE-370 IOPAMIDOL (ISOVUE-370) INJECTION 76% COMPARISON:  None. FINDINGS: CT HEAD FINDINGS Brain: No mass lesion, intraparenchymal hemorrhage or extra-axial collection. No evidence of acute cortical infarct. There is periventricular hypoattenuation compatible with chronic microvascular disease. Right frontal encephalomalacia. Vascular: No hyperdense vessel or unexpected calcification. Skull: Normal visualized skull base,  calvarium and extracranial soft tissues. Sinuses/Orbits: No sinus fluid levels or advanced mucosal thickening. No mastoid effusion. Normal orbits. CTA NECK FINDINGS Aortic arch: There is mild calcific atherosclerosis of the aortic arch. There is no aneurysm, dissection or hemodynamically significant stenosis of the visualized ascending aorta and aortic arch. Conventional 3 vessel aortic branching pattern. The visualized proximal subclavian arteries are widely patent. Right carotid system: The right common carotid origin is widely patent. There is no common carotid or internal carotid artery dissection or aneurysm. Atherosclerotic calcification at the carotid bifurcation without hemodynamically significant stenosis. Retro laryngeal course of the distal common carotid artery. Left carotid system: The left common carotid origin is widely patent. There is no common carotid or internal carotid artery dissection or aneurysm. Atherosclerotic calcification at the carotid bifurcation without hemodynamically significant stenosis. Retro laryngeal course of the distal common carotid artery. Vertebral arteries: The vertebral system is left dominant. Both vertebral artery origins are normal. Both vertebral arteries are normal to their confluence with the basilar artery. Skeleton: There is no bony spinal canal stenosis. No lytic or blastic lesions. Other neck: The nasopharynx is clear. The oropharynx and hypopharynx are normal. The epiglottis is normal. The supraglottic larynx, glottis and subglottic larynx are normal. No retropharyngeal collection. The parapharyngeal spaces are preserved. The parotid and submandibular glands are normal. No sialolithiasis or salivary ductal dilatation. The thyroid gland is normal. There is no cervical lymphadenopathy. Upper chest: No pneumothorax or pleural effusion. No nodules or masses. Review of the MIP images confirms the above findings CTA HEAD FINDINGS Anterior circulation: --Intracranial  internal carotid arteries: Atherosclerotic calcification bilaterally without flow limiting stenosis. --Anterior cerebral arteries: Normal. --Middle cerebral arteries: Normal. --Posterior communicating arteries: Absent bilaterally. Posterior circulation: --Posterior cerebral arteries: Normal. --Superior cerebellar arteries: Normal. --Basilar artery: Normal. --Anterior inferior cerebellar arteries: Normal. --Posterior inferior cerebellar arteries: Normal. Venous sinuses: As permitted by contrast timing, patent. Anatomic variants: None Delayed phase: No parenchymal contrast enhancement. Review of the MIP images confirms the above findings. IMPRESSION: 1. No emergent large vessel occlusion or flow-limiting stenosis. 2. Bilateral carotid bifurcation and intracranial ICA atherosclerotic calcification without significant stenosis by NASCET criteria. 3.  Aortic Atherosclerosis (ICD10-I70.0). 4. Right frontal encephalomalacia and sequelae of chronic ischemic microangiopathy. Electronically Signed   By: Deatra RobinsonKevin  Herman M.D.   On: 03/08/2017 23:21   Ct Angio Neck W Or Wo Contrast  Result Date: 03/08/2017 CLINICAL DATA:  Stroke follow-up EXAM: CT ANGIOGRAPHY HEAD AND NECK TECHNIQUE: Multidetector CT  imaging of the head and neck was performed using the standard protocol during bolus administration of intravenous contrast. Multiplanar CT image reconstructions and MIPs were obtained to evaluate the vascular anatomy. Carotid stenosis measurements (when applicable) are obtained utilizing NASCET criteria, using the distal internal carotid diameter as the denominator. CONTRAST:  50mL ISOVUE-370 IOPAMIDOL (ISOVUE-370) INJECTION 76% COMPARISON:  None. FINDINGS: CT HEAD FINDINGS Brain: No mass lesion, intraparenchymal hemorrhage or extra-axial collection. No evidence of acute cortical infarct. There is periventricular hypoattenuation compatible with chronic microvascular disease. Right frontal encephalomalacia. Vascular: No hyperdense  vessel or unexpected calcification. Skull: Normal visualized skull base, calvarium and extracranial soft tissues. Sinuses/Orbits: No sinus fluid levels or advanced mucosal thickening. No mastoid effusion. Normal orbits. CTA NECK FINDINGS Aortic arch: There is mild calcific atherosclerosis of the aortic arch. There is no aneurysm, dissection or hemodynamically significant stenosis of the visualized ascending aorta and aortic arch. Conventional 3 vessel aortic branching pattern. The visualized proximal subclavian arteries are widely patent. Right carotid system: The right common carotid origin is widely patent. There is no common carotid or internal carotid artery dissection or aneurysm. Atherosclerotic calcification at the carotid bifurcation without hemodynamically significant stenosis. Retro laryngeal course of the distal common carotid artery. Left carotid system: The left common carotid origin is widely patent. There is no common carotid or internal carotid artery dissection or aneurysm. Atherosclerotic calcification at the carotid bifurcation without hemodynamically significant stenosis. Retro laryngeal course of the distal common carotid artery. Vertebral arteries: The vertebral system is left dominant. Both vertebral artery origins are normal. Both vertebral arteries are normal to their confluence with the basilar artery. Skeleton: There is no bony spinal canal stenosis. No lytic or blastic lesions. Other neck: The nasopharynx is clear. The oropharynx and hypopharynx are normal. The epiglottis is normal. The supraglottic larynx, glottis and subglottic larynx are normal. No retropharyngeal collection. The parapharyngeal spaces are preserved. The parotid and submandibular glands are normal. No sialolithiasis or salivary ductal dilatation. The thyroid gland is normal. There is no cervical lymphadenopathy. Upper chest: No pneumothorax or pleural effusion. No nodules or masses. Review of the MIP images confirms the  above findings CTA HEAD FINDINGS Anterior circulation: --Intracranial internal carotid arteries: Atherosclerotic calcification bilaterally without flow limiting stenosis. --Anterior cerebral arteries: Normal. --Middle cerebral arteries: Normal. --Posterior communicating arteries: Absent bilaterally. Posterior circulation: --Posterior cerebral arteries: Normal. --Superior cerebellar arteries: Normal. --Basilar artery: Normal. --Anterior inferior cerebellar arteries: Normal. --Posterior inferior cerebellar arteries: Normal. Venous sinuses: As permitted by contrast timing, patent. Anatomic variants: None Delayed phase: No parenchymal contrast enhancement. Review of the MIP images confirms the above findings. IMPRESSION: 1. No emergent large vessel occlusion or flow-limiting stenosis. 2. Bilateral carotid bifurcation and intracranial ICA atherosclerotic calcification without significant stenosis by NASCET criteria. 3.  Aortic Atherosclerosis (ICD10-I70.0). 4. Right frontal encephalomalacia and sequelae of chronic ischemic microangiopathy. Electronically Signed   By: Deatra Robinson M.D.   On: 03/08/2017 23:21   Mr Brain Wo Contrast  Result Date: 03/08/2017 CLINICAL DATA:  Altered level of consciousness. EXAM: MRI HEAD WITHOUT CONTRAST TECHNIQUE: Multiplanar, multiecho pulse sequences of the brain and surrounding structures were obtained without intravenous contrast. COMPARISON:  CT head 03/06/2017 FINDINGS: Brain: 1 cm acute infarct in the left corona radiata. No other acute infarct Moderate atrophy. Chronic microvascular ischemic change in the white matter. Cystic encephalomalacia right inferior frontal lobe appears chronic and unchanged. Negative for hemorrhage or mass. Vascular: Normal arterial flow voids Skull and upper cervical spine: Negative degenerative anterolisthesis C3-4 with mild  spinal stenosis. No focal bone lesion. Sinuses/Orbits: Negative Other: Image quality degraded by mild to moderate motion  IMPRESSION: 1 cm acute infarct left corona radiata Moderate atrophy and chronic ischemic change. Electronically Signed   By: Marlan Palau M.D.   On: 03/08/2017 10:25        Scheduled Meds: . aspirin  300 mg Rectal Daily   Or  . aspirin  325 mg Oral Daily  . atorvastatin  20 mg Oral QHS  . citalopram  20 mg Oral Daily  . enoxaparin (LOVENOX) injection  40 mg Subcutaneous Q24H  . LORazepam  1 mg Intravenous Once  . metoprolol succinate  50 mg Oral Q breakfast  . traZODone  100 mg Oral QHS   Continuous Infusions: . meropenem (MERREM) IV Stopped (03/09/17 0230)     LOS: 2 days        [name], MD [facility] Pager [phone]  If 7PM-7AM, please contact night-coverage [email or web address] Password [email or web address] 03/09/2017, 1:22 PM

## 2017-03-10 DIAGNOSIS — Z1612 Extended spectrum beta lactamase (ESBL) resistance: Secondary | ICD-10-CM

## 2017-03-10 DIAGNOSIS — A499 Bacterial infection, unspecified: Secondary | ICD-10-CM

## 2017-03-10 LAB — BASIC METABOLIC PANEL
ANION GAP: 9 (ref 5–15)
BUN: 22 mg/dL — ABNORMAL HIGH (ref 6–20)
CALCIUM: 8.9 mg/dL (ref 8.9–10.3)
CO2: 26 mmol/L (ref 22–32)
CREATININE: 0.88 mg/dL (ref 0.44–1.00)
Chloride: 106 mmol/L (ref 101–111)
GFR, EST NON AFRICAN AMERICAN: 57 mL/min — AB (ref >60)
Glucose, Bld: 106 mg/dL — ABNORMAL HIGH (ref 65–99)
Potassium: 4.6 mmol/L (ref 3.5–5.1)
Sodium: 141 mmol/L (ref 135–145)

## 2017-03-10 MED ORDER — POLYETHYLENE GLYCOL 3350 17 G PO PACK: 17.0000 g | PACK | Freq: Every day | ORAL | 0 refills | Status: AC | PRN

## 2017-03-10 MED ORDER — LORAZEPAM 1 MG PO TABS: 1.0000 mg | ORAL_TABLET | Freq: Every day | ORAL | 0 refills | Status: DC | PRN

## 2017-03-10 MED ORDER — SODIUM CHLORIDE 0.9 % IV SOLN: 1.0000 g | Freq: Two times a day (BID) | INTRAVENOUS | 0 refills | Status: DC

## 2017-03-10 MED ORDER — ASPIRIN 325 MG PO TABS: 325.0000 mg | ORAL_TABLET | Freq: Every day | ORAL | 0 refills | Status: AC

## 2017-03-10 MED ORDER — SODIUM CHLORIDE 0.9% FLUSH: 10.0000 mL | INTRAVENOUS | Status: DC | PRN

## 2017-03-10 NOTE — Progress Notes (Signed)
Clinical Social Worker facilitated patient discharge including contacting patient family and facility to confirm patient discharge plans.  Clinical information faxed to facility and family agreeable with plan.  CSW arranged ambulance transport via [facility] to [facility] .  RN to call [phone] (pt will go in room 208) for report prior to discharge.  Clinical Social Worker will sign off for now as social work intervention is no longer needed. Please consult us again if new need arises.  Marrianne MoodAshley Talani Brazee, MSW, [employer]CSWA [phone]

## 2017-03-10 NOTE — Discharge Summary (Signed)
Physician Discharge Summary  Bonnie Mora ZOX:[id number] DOB: [birth year] DOA: 03/06/2017  PCP: [name], MD  Admit date: 03/06/2017 Discharge date: 03/10/2017  Admitted From: Home Disposition: SNF  Recommendations for Outpatient Follow-up:  1. Follow up with nursing home provider at earliest convenience  2. Follow-up with neurology as scheduled PCP in 1 week 3. Activity as per PT recommendations 4. Fall precautions   Home Health: No Equipment/Devices: None  Discharge Condition: Guarded CODE STATUS: DNR Diet recommendation: As per SLP recommendations  Brief/Interim Summary: 84 year old female with history of advanced dementia, hypertension and anxiety presented with increasing confusion.  Patient was admitted with UTI and started on antibiotics.  MRI of the brain was also ordered.  Patient was found to have acute stroke.  Neurology evaluated the patient.  Urine culture grew ESBL E. coli.  Rocephin was switched to intravenous meropenem.  She will be discharged to nursing home to finish 5-day course of intravenous meropenem.  Her mental status has much improved.    Discharge Diagnoses:  Principal Problem:   Acute encephalopathy Active Problems:   Dementia   HLD (hyperlipidemia)   Essential hypertension   Acute lower UTI   Cerebral thrombosis with cerebral infarction   1. Acute CVA: Neurology evaluation appreciated. Continue aspirin and statin.  LDL 56.  Echo showed EF of 65-70% with grade 1 diastolic dysfunction.  PT recommends nursing home placement. 2.  Acute metabolic encephalopathy-likely from stroke and UTI.   Mental status is probably back to baseline.  Continue monitoring mental status  3. UTI: Urine cultures growing ESBL E. coli.    Initially started on Rocephin.  Switch to meropenem.  Patient has already gotten 2 days of intravenous meropenem.  Discharge to nursing home to complete a total of 5-day course of intravenous meropenem  4. hypertension: Blood  pressure controlled.  Will monitor.  Continue metoprolol.  5. History of dementia: Monitor mental status.  Fall precautions. 6. History of anxiety: Continue citalopram.    Discharge Instructions  Discharge Instructions    Ambulatory referral to Neurology   Complete by:  As directed    An appointment is requested in approximately: 6 weeks Follow up with stroke clinic Darrol Angel preferred, if not available, then consider Sylvie Farrier, [facility] or Lucia Gaskins whoever is available) at [facility] in about 6-8 weeks. Thanks.   Call MD for:  difficulty breathing, headache or visual disturbances   Complete by:  As directed    Call MD for:  extreme fatigue   Complete by:  As directed    Call MD for:  hives   Complete by:  As directed    Call MD for:  persistant dizziness or light-headedness   Complete by:  As directed    Call MD for:  persistant nausea and vomiting   Complete by:  As directed    Call MD for:  severe uncontrolled pain   Complete by:  As directed    Call MD for:  temperature >100.4   Complete by:  As directed    Diet - low sodium heart healthy   Complete by:  As directed    Discharge instructions   Complete by:  As directed    Fall precautions Diet as per SLP recommendations Activity as per PT recommendations   Increase activity slowly   Complete by:  As directed      Allergies as of 03/10/2017      Reactions   Codeine Nausea And Vomiting      Medication  List    STOP taking these medications   ALPRAZolam 0.5 MG tablet Commonly known as:  XANAX   promethazine-dextromethorphan 6.25-15 MG/5ML syrup Commonly known as:  PROMETHAZINE-DM   saccharomyces boulardii 250 MG capsule Commonly known as:  FLORASTOR     TAKE these medications   albuterol 108 (90 Base) MCG/ACT inhaler Commonly known as:  PROVENTIL HFA;VENTOLIN HFA Inhale 2 puffs into the lungs every 6 (six) hours as needed for wheezing or shortness of breath.   aspirin 325 MG tablet Take 1 tablet (325 mg  total) by mouth daily.   atorvastatin 20 MG tablet Commonly known as:  LIPITOR Take 1 tablet (20 mg total) by mouth daily at 6 PM. What changed:  when to take this   citalopram 20 MG tablet Commonly known as:  CELEXA Take 1 tablet (20 mg total) by mouth daily.   LORazepam 1 MG tablet Commonly known as:  ATIVAN Take 1 tablet (1 mg total) by mouth daily as needed for anxiety or sleep. What changed:  reasons to take this   meropenem 1 g in sodium chloride 0.9 % 100 mL Inject 1 g into the vein every 12 (twelve) hours.   metoprolol succinate 50 MG 24 hr tablet Commonly known as:  TOPROL-XL Take 1 tablet (50 mg total) by mouth daily with breakfast.   mupirocin ointment 2 % Commonly known as:  BACTROBAN Place 1 application into the nose daily as needed (wound care).   polyethylene glycol packet Commonly known as:  MIRALAX / GLYCOLAX Take 17 g by mouth daily as needed for mild constipation.   traZODone 100 MG tablet Commonly known as:  DESYREL Take 1 tablet (100 mg total) by mouth at bedtime.      Follow-up Information    Nilda Riggs, NP. Schedule an appointment as soon as possible for a visit in 6 week(s).   Specialty:  Family Medicine Contact information: [address] [place] [address] [phone]        [name], MD. Schedule an appointment as soon as possible for a visit in 1 week(s).   Specialty:  Internal Medicine Contact information: [address] [place] [address] [phone]          Allergies  Allergen Reactions  . Codeine Nausea And Vomiting    Consultations:  Neurology   Procedures/Studies: Ct Angio Head W Or Wo Contrast  Result Date: 03/08/2017 CLINICAL DATA:  Stroke follow-up EXAM: CT ANGIOGRAPHY HEAD AND NECK TECHNIQUE: Multidetector CT imaging of the head and neck was performed using the standard protocol during bolus administration of intravenous contrast. Multiplanar CT image  reconstructions and MIPs were obtained to evaluate the vascular anatomy. Carotid stenosis measurements (when applicable) are obtained utilizing NASCET criteria, using the distal internal carotid diameter as the denominator. CONTRAST:  50mL ISOVUE-370 IOPAMIDOL (ISOVUE-370) INJECTION 76% COMPARISON:  None. FINDINGS: CT HEAD FINDINGS Brain: No mass lesion, intraparenchymal hemorrhage or extra-axial collection. No evidence of acute cortical infarct. There is periventricular hypoattenuation compatible with chronic microvascular disease. Right frontal encephalomalacia. Vascular: No hyperdense vessel or unexpected calcification. Skull: Normal visualized skull base, calvarium and extracranial soft tissues. Sinuses/Orbits: No sinus fluid levels or advanced mucosal thickening. No mastoid effusion. Normal orbits. CTA NECK FINDINGS Aortic arch: There is mild calcific atherosclerosis of the aortic arch. There is no aneurysm, dissection or hemodynamically significant stenosis of the visualized ascending aorta and aortic arch. Conventional 3 vessel aortic branching pattern. The visualized proximal subclavian arteries are widely patent.  Right carotid system: The right common carotid origin is widely patent. There is no common carotid or internal carotid artery dissection or aneurysm. Atherosclerotic calcification at the carotid bifurcation without hemodynamically significant stenosis. Retro laryngeal course of the distal common carotid artery. Left carotid system: The left common carotid origin is widely patent. There is no common carotid or internal carotid artery dissection or aneurysm. Atherosclerotic calcification at the carotid bifurcation without hemodynamically significant stenosis. Retro laryngeal course of the distal common carotid artery. Vertebral arteries: The vertebral system is left dominant. Both vertebral artery origins are normal. Both vertebral arteries are normal to their confluence with the basilar artery.  Skeleton: There is no bony spinal canal stenosis. No lytic or blastic lesions. Other neck: The nasopharynx is clear. The oropharynx and hypopharynx are normal. The epiglottis is normal. The supraglottic larynx, glottis and subglottic larynx are normal. No retropharyngeal collection. The parapharyngeal spaces are preserved. The parotid and submandibular glands are normal. No sialolithiasis or salivary ductal dilatation. The thyroid gland is normal. There is no cervical lymphadenopathy. Upper chest: No pneumothorax or pleural effusion. No nodules or masses. Review of the MIP images confirms the above findings CTA HEAD FINDINGS Anterior circulation: --Intracranial internal carotid arteries: Atherosclerotic calcification bilaterally without flow limiting stenosis. --Anterior cerebral arteries: Normal. --Middle cerebral arteries: Normal. --Posterior communicating arteries: Absent bilaterally. Posterior circulation: --Posterior cerebral arteries: Normal. --Superior cerebellar arteries: Normal. --Basilar artery: Normal. --Anterior inferior cerebellar arteries: Normal. --Posterior inferior cerebellar arteries: Normal. Venous sinuses: As permitted by contrast timing, patent. Anatomic variants: None Delayed phase: No parenchymal contrast enhancement. Review of the MIP images confirms the above findings. IMPRESSION: 1. No emergent large vessel occlusion or flow-limiting stenosis. 2. Bilateral carotid bifurcation and intracranial ICA atherosclerotic calcification without significant stenosis by NASCET criteria. 3.  Aortic Atherosclerosis (ICD10-I70.0). 4. Right frontal encephalomalacia and sequelae of chronic ischemic microangiopathy. Electronically Signed   By: Deatra Robinson M.D.   On: 03/08/2017 23:21   Dg Chest 2 View  Result Date: 03/06/2017 CLINICAL DATA:  Altered mental status. EXAM: CHEST  2 VIEW COMPARISON:  Chest x-ray dated January 21, 2017. FINDINGS: The heart size and mediastinal contours are within normal  limits. Normal pulmonary vascularity. Atherosclerotic calcification of the aortic arch. Left basilar atelectasis/scarring in noted. No focal consolidation, pleural effusion, or pneumothorax. No acute osseous abnormality. Unchanged eventration of the right hemidiaphragm. IMPRESSION: No active cardiopulmonary disease. Electronically Signed   By: Obie Dredge M.D.   On: 03/06/2017 16:50   Ct Head Wo Contrast  Result Date: 03/06/2017 CLINICAL DATA:  Confusion. EXAM: CT HEAD WITHOUT CONTRAST TECHNIQUE: Contiguous axial images were obtained from the base of the skull through the vertex without intravenous contrast. COMPARISON:  03/16/2009 FINDINGS: Brain: There is motion artifact predominantly through the skull base. Encephalomalacia in the anterior right frontal lobe appears to have mildly progressed from 2011 and may be posttraumatic or postischemic, with motion artifact through this region mildly limiting assessment. No definite acute large territory infarct, intracranial hemorrhage, mass, midline shift, or extra-axial fluid collection is identified. Periventricular white matter hypodensities of progressed and are nonspecific but compatible with moderate chronic small vessel ischemic disease. There is moderate cerebral atrophy. Vascular: Calcified atherosclerosis at the skull base. No hyperdense vessel. Skull: No fracture or suspicious osseous lesion. Sinuses/Orbits: Visualized paranasal sinuses and mastoid air cells are grossly clear. No gross acute abnormality in the included orbits. Other: None. IMPRESSION: 1. Motion degraded examination without definite acute intracranial abnormality identified. 2. Suspected progressive encephalomalacia in the anterior right  frontal lobe. 3. Progressive, moderate chronic small vessel ischemic disease and cerebral atrophy. Electronically Signed   By: Sebastian Ache M.D.   On: 03/06/2017 18:04   Ct Angio Neck W Or Wo Contrast  Result Date: 03/08/2017 CLINICAL DATA:  Stroke  follow-up EXAM: CT ANGIOGRAPHY HEAD AND NECK TECHNIQUE: Multidetector CT imaging of the head and neck was performed using the standard protocol during bolus administration of intravenous contrast. Multiplanar CT image reconstructions and MIPs were obtained to evaluate the vascular anatomy. Carotid stenosis measurements (when applicable) are obtained utilizing NASCET criteria, using the distal internal carotid diameter as the denominator. CONTRAST:  50mL ISOVUE-370 IOPAMIDOL (ISOVUE-370) INJECTION 76% COMPARISON:  None. FINDINGS: CT HEAD FINDINGS Brain: No mass lesion, intraparenchymal hemorrhage or extra-axial collection. No evidence of acute cortical infarct. There is periventricular hypoattenuation compatible with chronic microvascular disease. Right frontal encephalomalacia. Vascular: No hyperdense vessel or unexpected calcification. Skull: Normal visualized skull base, calvarium and extracranial soft tissues. Sinuses/Orbits: No sinus fluid levels or advanced mucosal thickening. No mastoid effusion. Normal orbits. CTA NECK FINDINGS Aortic arch: There is mild calcific atherosclerosis of the aortic arch. There is no aneurysm, dissection or hemodynamically significant stenosis of the visualized ascending aorta and aortic arch. Conventional 3 vessel aortic branching pattern. The visualized proximal subclavian arteries are widely patent. Right carotid system: The right common carotid origin is widely patent. There is no common carotid or internal carotid artery dissection or aneurysm. Atherosclerotic calcification at the carotid bifurcation without hemodynamically significant stenosis. Retro laryngeal course of the distal common carotid artery. Left carotid system: The left common carotid origin is widely patent. There is no common carotid or internal carotid artery dissection or aneurysm. Atherosclerotic calcification at the carotid bifurcation without hemodynamically significant stenosis. Retro laryngeal course of  the distal common carotid artery. Vertebral arteries: The vertebral system is left dominant. Both vertebral artery origins are normal. Both vertebral arteries are normal to their confluence with the basilar artery. Skeleton: There is no bony spinal canal stenosis. No lytic or blastic lesions. Other neck: The nasopharynx is clear. The oropharynx and hypopharynx are normal. The epiglottis is normal. The supraglottic larynx, glottis and subglottic larynx are normal. No retropharyngeal collection. The parapharyngeal spaces are preserved. The parotid and submandibular glands are normal. No sialolithiasis or salivary ductal dilatation. The thyroid gland is normal. There is no cervical lymphadenopathy. Upper chest: No pneumothorax or pleural effusion. No nodules or masses. Review of the MIP images confirms the above findings CTA HEAD FINDINGS Anterior circulation: --Intracranial internal carotid arteries: Atherosclerotic calcification bilaterally without flow limiting stenosis. --Anterior cerebral arteries: Normal. --Middle cerebral arteries: Normal. --Posterior communicating arteries: Absent bilaterally. Posterior circulation: --Posterior cerebral arteries: Normal. --Superior cerebellar arteries: Normal. --Basilar artery: Normal. --Anterior inferior cerebellar arteries: Normal. --Posterior inferior cerebellar arteries: Normal. Venous sinuses: As permitted by contrast timing, patent. Anatomic variants: None Delayed phase: No parenchymal contrast enhancement. Review of the MIP images confirms the above findings. IMPRESSION: 1. No emergent large vessel occlusion or flow-limiting stenosis. 2. Bilateral carotid bifurcation and intracranial ICA atherosclerotic calcification without significant stenosis by NASCET criteria. 3.  Aortic Atherosclerosis (ICD10-I70.0). 4. Right frontal encephalomalacia and sequelae of chronic ischemic microangiopathy. Electronically Signed   By: Deatra Robinson M.D.   On: 03/08/2017 23:21   Mr Brain  Wo Contrast  Result Date: 03/08/2017 CLINICAL DATA:  Altered level of consciousness. EXAM: MRI HEAD WITHOUT CONTRAST TECHNIQUE: Multiplanar, multiecho pulse sequences of the brain and surrounding structures were obtained without intravenous contrast. COMPARISON:  CT head 03/06/2017 FINDINGS:  Brain: 1 cm acute infarct in the left corona radiata. No other acute infarct Moderate atrophy. Chronic microvascular ischemic change in the white matter. Cystic encephalomalacia right inferior frontal lobe appears chronic and unchanged. Negative for hemorrhage or mass. Vascular: Normal arterial flow voids Skull and upper cervical spine: Negative degenerative anterolisthesis C3-4 with mild spinal stenosis. No focal bone lesion. Sinuses/Orbits: Negative Other: Image quality degraded by mild to moderate motion IMPRESSION: 1 cm acute infarct left corona radiata Moderate atrophy and chronic ischemic change. Electronically Signed   By: Marlan Palau M.D.   On: 03/08/2017 10:25    Echo Study Conclusions  - Left ventricle: The cavity size was normal. Wall thickness was increased in a pattern of mild LVH. Systolic function was vigorous. The estimated ejection fraction was in the range of 65% to 70%. Wall motion was normal; there were no regional wall motion abnormalities. Doppler parameters are consistent with abnormal left ventricular relaxation (grade 1 diastolic dysfunction). The E/e&' ratio is <8, suggesting normal LV filling pressure. - Aortic valve: Sclerosis without stenosis. There was no regurgitation. - Mitral valve: Mildly thickened leaflets . There was trivial regurgitation. - Left atrium: The atrium was normal in size. - Inferior vena cava: The vessel was normal in size. The respirophasic diameter changes were in the normal range (>= 50%), consistent with normal central venous pressure.  Impressions:  - Compared to a prior study in 2010, the LVEF is higher at  65-70%, mildLVH, normal wall motion, grade 1 DD and normal LV filling pressure.     Subjective: Patient seen and examined at bedside.  She is awake, having her breakfast but confused.  No overnight fever or vomiting.  Discharge Exam: Vitals:   03/10/17 0544 03/10/17 0956  BP: (!) 164/75 (!) 134/54  Pulse: (!) 59 77  Resp: 19 20  Temp: 98.5 F (36.9 C) 98.5 F (36.9 C)  SpO2: 96% 97%   Vitals:   03/09/17 2131 03/09/17 2134 03/10/17 0544 03/10/17 0956  BP:  119/69 (!) 164/75 (!) 134/54  Pulse: 68  (!) 59 77  Resp: 18  19 20   Temp: 98.1 F (36.7 C)  98.5 F (36.9 C) 98.5 F (36.9 C)  TempSrc: Oral  Oral Oral  SpO2:  96% 96% 97%  Weight:      Height:        General: Pt is elderly female lying in bed, awake but confused  cardiovascular: Rate controlled, S1/S2 + Respiratory: Bilateral decreased breath sounds at bases  abdominal: Soft, NT, ND, bowel sounds + Extremities: Trace edema, no cyanosis    The results of significant diagnostics from this hospitalization (including imaging, microbiology, ancillary and laboratory) are listed below for reference.     Microbiology: Recent Results (from the past 240 hour(s))  Urine culture     Status: Abnormal   Collection Time: 03/06/17  4:15 PM  Result Value Ref Range Status   Specimen Description URINE, RANDOM  Final   Special Requests NONE  Final   Culture (A)  Final    >=100,000 COLONIES/mL ESCHERICHIA COLI Confirmed Extended Spectrum Beta-Lactamase Producer (ESBL).  In bloodstream infections from ESBL organisms, carbapenems are preferred over piperacillin/tazobactam. They are shown to have a lower risk of mortality. Performed at [facility], [address], [place], [place] [address]    Report Status 03/08/2017 FINAL  Final   Organism ID, Bacteria ESCHERICHIA COLI (A)  Final      Susceptibility   Escherichia coli - MIC*  AMPICILLIN >=32 RESISTANT Resistant     CEFAZOLIN >=64 RESISTANT Resistant      CEFTRIAXONE >=64 RESISTANT Resistant     CIPROFLOXACIN >=4 RESISTANT Resistant     GENTAMICIN <=1 SENSITIVE Sensitive     IMIPENEM <=0.25 SENSITIVE Sensitive     NITROFURANTOIN 64 INTERMEDIATE Intermediate     TRIMETH/SULFA >=320 RESISTANT Resistant     AMPICILLIN/SULBACTAM >=32 RESISTANT Resistant     PIP/TAZO 64 INTERMEDIATE Intermediate     Extended ESBL POSITIVE Resistant     * >=100,000 COLONIES/mL ESCHERICHIA COLI  Culture, blood (routine x 2)     Status: None (Preliminary result)   Collection Time: 03/08/17  5:45 AM  Result Value Ref Range Status   Specimen Description BLOOD RIGHT HAND  Final   Special Requests IN PEDIATRIC BOTTLE Blood Culture adequate volume  Final   Culture   Final    NO GROWTH 1 DAY Performed at [facility], [address], [place], [place] [address]    Report Status PENDING  Incomplete  Culture, blood (routine x 2)     Status: None (Preliminary result)   Collection Time: 03/08/17  5:45 AM  Result Value Ref Range Status   Specimen Description BLOOD RIGHT HAND  Final   Special Requests IN PEDIATRIC BOTTLE Blood Culture adequate volume  Final   Culture   Final    NO GROWTH 1 DAY Performed at [facility], [address] [place], [place] [address]    Report Status PENDING  Incomplete     Labs: BNP (last 3 results) No results for input(s): BNP in the last 8760 hours. Basic Metabolic Panel: Recent Labs  Lab 03/06/17 1505 03/07/17 0906 03/08/17 0544 03/09/17 0356 03/10/17 0324  NA 141 140 142 141 141  K 4.0 4.0 4.6 3.9 4.6  CL 106 106 108 104 106  CO2 22 24 23 26 26   GLUCOSE 169* 108* 108* 108* 106*  BUN 19 15 18 13  22*  CREATININE 0.79 0.74 0.78 0.77 0.88  CALCIUM 9.2 8.9 8.9 9.1 8.9  MG  --  1.9 2.0 2.0  --    Liver Function Tests: Recent Labs  Lab 03/06/17 1505 03/07/17 0906  AST 25 21  ALT 18 15  ALKPHOS 69 66  BILITOT 0.7 0.6  PROT 6.7 6.1*  ALBUMIN 3.5 3.1*   No results for input(s): LIPASE, AMYLASE in the last  168 hours. No results for input(s): AMMONIA in the last 168 hours. CBC: Recent Labs  Lab 03/06/17 1505 03/07/17 0906 03/08/17 0544 03/09/17 0356  WBC 5.9 6.0 6.0 6.3  NEUTROABS  --  3.7 3.5 3.7  HGB 13.2 11.8* 12.0 13.2  HCT 41.8 37.6 38.1 41.5  MCV 93.3 93.5 93.2 93.3  PLT 221 207 210 219   Cardiac Enzymes: No results for input(s): CKTOTAL, CKMB, CKMBINDEX, TROPONINI in the last 168 hours. BNP: Invalid input(s): POCBNP CBG: Recent Labs  Lab 03/06/17 1531  GLUCAP 140*   D-Dimer No results for input(s): DDIMER in the last 72 hours. Hgb A1c Recent Labs    03/08/17 1537  HGBA1C 6.3*   Lipid Profile Recent Labs    03/08/17 1537 03/09/17 0356  CHOL 138 139  HDL 56 51  LDLCALC 59 56  TRIG 115 159*  CHOLHDL 2.5 2.7   Thyroid function studies No results for input(s): TSH, T4TOTAL, T3FREE, THYROIDAB in the last 72 hours.  Invalid input(s): FREET3 Anemia work up No results for input(s): VITAMINB12, FOLATE, FERRITIN, TIBC, IRON,  RETICCTPCT in the last 72 hours. Urinalysis    Component Value Date/Time   COLORURINE YELLOW 03/06/2017 1618   APPEARANCEUR HAZY (A) 03/06/2017 1618   LABSPEC 1.024 03/06/2017 1618   PHURINE 5.0 03/06/2017 1618   GLUCOSEU NEGATIVE 03/06/2017 1618   HGBUR NEGATIVE 03/06/2017 1618   BILIRUBINUR NEGATIVE 03/06/2017 1618   BILIRUBINUR neg 11/03/2016 1634   KETONESUR NEGATIVE 03/06/2017 1618   PROTEINUR NEGATIVE 03/06/2017 1618   UROBILINOGEN 0.2 11/03/2016 1634   UROBILINOGEN 0.2 03/10/2014 0931   NITRITE POSITIVE (A) 03/06/2017 1618   LEUKOCYTESUR SMALL (A) 03/06/2017 1618   Sepsis Labs Invalid input(s): PROCALCITONIN,  WBC,  LACTICIDVEN Microbiology Recent Results (from the past 240 hour(s))  Urine culture     Status: Abnormal   Collection Time: 03/06/17  4:15 PM  Result Value Ref Range Status   Specimen Description URINE, RANDOM  Final   Special Requests NONE  Final   Culture (A)  Final    >=100,000 COLONIES/mL ESCHERICHIA  COLI Confirmed Extended Spectrum Beta-Lactamase Producer (ESBL).  In bloodstream infections from ESBL organisms, carbapenems are preferred over piperacillin/tazobactam. They are shown to have a lower risk of mortality. Performed at [facility], [address] [place], [place] [id number]    Report Status 03/08/2017 FINAL  Final   Organism ID, Bacteria ESCHERICHIA COLI (A)  Final      Susceptibility   Escherichia coli - MIC*    AMPICILLIN >=32 RESISTANT Resistant     CEFAZOLIN >=64 RESISTANT Resistant     CEFTRIAXONE >=64 RESISTANT Resistant     CIPROFLOXACIN >=4 RESISTANT Resistant     GENTAMICIN <=1 SENSITIVE Sensitive     IMIPENEM <=0.25 SENSITIVE Sensitive     NITROFURANTOIN 64 INTERMEDIATE Intermediate     TRIMETH/SULFA >=320 RESISTANT Resistant     AMPICILLIN/SULBACTAM >=32 RESISTANT Resistant     PIP/TAZO 64 INTERMEDIATE Intermediate     Extended ESBL POSITIVE Resistant     * >=100,000 COLONIES/mL ESCHERICHIA COLI  Culture, blood (routine x 2)     Status: None (Preliminary result)   Collection Time: 03/08/17  5:45 AM  Result Value Ref Range Status   Specimen Description BLOOD RIGHT HAND  Final   Special Requests IN PEDIATRIC BOTTLE Blood Culture adequate volume  Final   Culture   Final    NO GROWTH 1 DAY Performed at [facility], [address] [place], [place] [address]    Report Status PENDING  Incomplete  Culture, blood (routine x 2)     Status: None (Preliminary result)   Collection Time: 03/08/17  5:45 AM  Result Value Ref Range Status   Specimen Description BLOOD RIGHT HAND  Final   Special Requests IN PEDIATRIC BOTTLE Blood Culture adequate volume  Final   Culture   Final    NO GROWTH 1 DAY Performed at [facility], [address] [place], [place] [address]    Report Status PENDING  Incomplete     Time coordinating discharge: 40 minutes  SIGNED:   Glade LloydKshitiz Seniyah Esker, MD  [facility] 03/10/2017, 10:03 AM Pager: [phone]  If  7PM-7AM, please contact night-coverage [email or web address] Password [email or web address]

## 2017-03-10 NOTE — Progress Notes (Signed)
Pt discharged to [facility]. Report called to receiving facility. Midline in place to LUE upon dc. PTAR in to transport patient, family aware.

## 2017-03-11 ENCOUNTER — Telehealth: Payer: Self-pay | Admitting: *Deleted

## 2017-03-11 NOTE — Telephone Encounter (Signed)
Pt was on TCM report to hospital went to ED for being confuse, and was admitted 03/06/17 for UTI. Pt started on antibiotics. MRI of the brain was also ordered.  Patient was found to have acute stroke. Her mental status has improved. Pt D/c 03/10/17 to SNF to finish up IV meropenem. Once d/c from SNF will need to f/u w/PCP 1 week later.Bonnie Mora.Raechel Chute/lmb

## 2017-03-13 LAB — CULTURE, BLOOD (ROUTINE X 2)
CULTURE: NO GROWTH
Culture: NO GROWTH
Special Requests: ADEQUATE
Special Requests: ADEQUATE

## 2017-03-24 ENCOUNTER — Encounter: Payer: Self-pay | Admitting: Internal Medicine

## 2017-03-24 ENCOUNTER — Ambulatory Visit: Payer: Medicare Other | Admitting: Internal Medicine

## 2017-03-24 ENCOUNTER — Telehealth: Payer: Self-pay

## 2017-03-24 VITALS — BP 138/86 | HR 73 | Temp 98.4°F | Ht 60.0 in

## 2017-03-24 DIAGNOSIS — M1991 Primary osteoarthritis, unspecified site: Secondary | ICD-10-CM

## 2017-03-24 DIAGNOSIS — I10 Essential (primary) hypertension: Secondary | ICD-10-CM

## 2017-03-24 DIAGNOSIS — G301 Alzheimer's disease with late onset: Secondary | ICD-10-CM | POA: Diagnosis not present

## 2017-03-24 DIAGNOSIS — F0281 Dementia in other diseases classified elsewhere with behavioral disturbance: Secondary | ICD-10-CM

## 2017-03-24 DIAGNOSIS — N390 Urinary tract infection, site not specified: Secondary | ICD-10-CM | POA: Diagnosis not present

## 2017-03-24 DIAGNOSIS — Z8673 Personal history of transient ischemic attack (TIA), and cerebral infarction without residual deficits: Secondary | ICD-10-CM

## 2017-03-24 MED ORDER — LORAZEPAM 1 MG PO TABS: 1.0000 mg | ORAL_TABLET | Freq: Every day | ORAL | 5 refills | Status: DC | PRN

## 2017-03-24 NOTE — Telephone Encounter (Signed)
[facility] [phone](P) [phone] (F) [phone] Address [address]               [place] [address]

## 2017-03-24 NOTE — Telephone Encounter (Signed)
Do they have any kind of contact information for this company so we can send the order to them.

## 2017-03-24 NOTE — Telephone Encounter (Signed)
Copied from CRM [phone]. Topic: General - Other >> Mar 24, 2017  1:32 PM Arlyss Gandyichardson, Taren N, NT wrote: Reason for CRM: Roselie AwkwardKimberly Moser pts caregiver calling and states they need the hospital bed that is being ordered through [facility] to go to [address] [place] [address]. CB#: [phone]. She needs the bed for tonight.

## 2017-03-24 NOTE — Progress Notes (Signed)
   Subjective:    Patient ID: Bonnie Mora, female    DOB: 1926/02/09, 84 y.o.   MRN: [id number]  HPI  The patient is an 84 YO female coming in for rehab and hospital follow up (in for encephalopathy due to stroke and infection, treated with antibiotics and therapy, went to rehab for further therapy). She has been able to do some improvement at home with the therapy. They are still coming to the house at this time. She has fallen a couple of times and there are some skin tears on her right arm. She denies concerns. She has moderate to severe dementia and is not able to tell about herself. Her daughters provide history. They deny chest pains, abdominal pain, diarrhea, constipation. She does have some SOB with walking and cough at night time. She does not cough after eating. She is having some congestion. Not taking anything for that. Overall gradually improved from the hospital. Does have sundowning and needs supervision at night time.   Due to current medical condition of stroke, osteoarthritis the patient requires positioning of the body in ways not feasible with an ordinary bed. Pillows and wedges have been considered and ruled out. Patient requires a bed height different than a fixed height hospital bed to permit transfers. The patient also requires frequent changes in body position due to their condition.   [facility], social history reviewed and updated.   Review of Systems  Unable to perform ROS: Dementia  Constitutional: Positive for activity change.  HENT: Negative.   Eyes: Negative.   Respiratory: Positive for cough.   Cardiovascular: Negative.   Gastrointestinal: Negative.   Musculoskeletal: Negative.   Skin: Positive for wound.  Neurological: Positive for weakness.      Objective:   Physical Exam  Constitutional: She appears well-developed and well-nourished.  HENT:  Head: Normocephalic and atraumatic.  Eyes: EOM are normal.  Neck: Normal range of motion.  Cardiovascular:  Normal rate and regular rhythm.  Pulmonary/Chest: Effort normal and breath sounds normal. No respiratory distress. She has no wheezes. She has no rales. She exhibits no tenderness.  Abdominal: Soft. Bowel sounds are normal. She exhibits no distension. There is no tenderness. There is no rebound and no guarding.  Neurological: She is alert. Coordination abnormal.  Can follow 1 step command  Skin: Skin is warm and dry.  2 covered skin tears on the right forearm   Vitals:   03/24/17 1114  BP: 138/86  Pulse: 73  Temp: 98.4 F (36.9 C)  TempSrc: Oral  SpO2: 94%  Height: 5' (1.524 m)      Assessment & Plan:

## 2017-03-24 NOTE — Patient Instructions (Signed)
We will get the hospital bed to the house.   We will send in the refills.

## 2017-03-24 NOTE — Telephone Encounter (Signed)
Pt's care taker Cala BradfordKimberly called back in to update provider. Pt's order is actually with [facility] of [facility]    CB: [phone]

## 2017-03-24 NOTE — Telephone Encounter (Signed)
This does not typically happen this quickly. Generally these have to be approved through insurance and it could be up to 1 week.

## 2017-03-24 NOTE — Telephone Encounter (Signed)
You can fax the hospital bed order to them. Should be able to print from the computer orders section.

## 2017-03-24 NOTE — Telephone Encounter (Signed)
Order faxed to [facility]

## 2017-03-27 ENCOUNTER — Ambulatory Visit: Payer: Medicare Other | Admitting: Internal Medicine

## 2017-03-27 NOTE — Assessment & Plan Note (Signed)
Moderate to severe at this time, refill ativan to help with sleep at night time. She is taking celexa which had helped initially with some improvement and will keep trazodone for sleep as well.

## 2017-03-27 NOTE — Assessment & Plan Note (Signed)
Resolved at this time no symptoms and mental status is baseline.

## 2017-03-27 NOTE — Assessment & Plan Note (Signed)
Workup without a fib in the hospital and sent home on aspirin for prevention of further episodes. Symptoms are stable now. Needs hospital bed for positioning and safety at home. Ordered at visit.

## 2017-03-27 NOTE — Assessment & Plan Note (Signed)
BP at goal on her metoprolol.

## 2017-04-01 ENCOUNTER — Telehealth: Payer: Self-pay | Admitting: Internal Medicine

## 2017-04-01 NOTE — Telephone Encounter (Signed)
Copied from CRM [id number]. Topic: General - Other >> Apr 01, 2017  3:40 PM Maia PettiesOrtiz, Kristie S wrote: Requesting VO for OT 2x week for 3 weeks

## 2017-04-02 NOTE — Telephone Encounter (Signed)
Notified Leslie w/MD response../lmb 

## 2017-04-02 NOTE — Telephone Encounter (Signed)
fine

## 2017-04-08 DIAGNOSIS — Z87440 Personal history of urinary (tract) infections: Secondary | ICD-10-CM

## 2017-04-08 DIAGNOSIS — F329 Major depressive disorder, single episode, unspecified: Secondary | ICD-10-CM | POA: Diagnosis not present

## 2017-04-08 DIAGNOSIS — I10 Essential (primary) hypertension: Secondary | ICD-10-CM | POA: Diagnosis not present

## 2017-04-08 DIAGNOSIS — Z9181 History of falling: Secondary | ICD-10-CM

## 2017-04-08 DIAGNOSIS — F419 Anxiety disorder, unspecified: Secondary | ICD-10-CM | POA: Diagnosis not present

## 2017-04-08 DIAGNOSIS — F0390 Unspecified dementia without behavioral disturbance: Secondary | ICD-10-CM | POA: Diagnosis not present

## 2017-04-08 DIAGNOSIS — Z8673 Personal history of transient ischemic attack (TIA), and cerebral infarction without residual deficits: Secondary | ICD-10-CM

## 2017-04-10 ENCOUNTER — Ambulatory Visit: Payer: Medicare Other | Admitting: Family

## 2017-04-10 ENCOUNTER — Encounter: Payer: Self-pay | Admitting: Family

## 2017-04-10 VITALS — BP 124/80 | HR 70 | Temp 98.7°F | Ht 60.0 in | Wt 156.0 lb

## 2017-04-10 DIAGNOSIS — F0391 Unspecified dementia with behavioral disturbance: Secondary | ICD-10-CM

## 2017-04-10 MED ORDER — ALPRAZOLAM 0.5 MG PO TABS: 0.5000 mg | ORAL_TABLET | Freq: Two times a day (BID) | ORAL | 0 refills | Status: DC | PRN

## 2017-04-10 MED ORDER — MUPIROCIN 2 % EX OINT: 1.0000 "application " | TOPICAL_OINTMENT | Freq: Every day | CUTANEOUS | 0 refills | Status: DC | PRN

## 2017-04-10 NOTE — Progress Notes (Signed)
Bonnie Mora is a 84 y.o. female with the following history as recorded in EpicCare:  Patient Active Problem List   Diagnosis Date Noted  . Hx of completed stroke 03/08/2017  . Acute lower UTI 03/06/2017  . Chronic left shoulder pain 08/19/2016  . Routine general medical examination at a health care facility 04/25/2016  . Dementia 03/10/2014  . HLD (hyperlipidemia) 03/10/2014  . Essential hypertension 03/10/2014    Current Outpatient Medications  Medication Sig Dispense Refill  . sulfamethoxazole-trimethoprim (BACTRIM DS,SEPTRA DS) 800-160 MG tablet Take 1 tablet by mouth 2 (two) times daily.    Marland Kitchen albuterol (PROVENTIL HFA;VENTOLIN HFA) 108 (90 Base) MCG/ACT inhaler Inhale 2 puffs into the lungs every 6 (six) hours as needed for wheezing or shortness of breath. 1 Inhaler 2  . ALPRAZolam (XANAX) 0.5 MG tablet Take 1 tablet (0.5 mg total) by mouth 2 (two) times daily as needed for anxiety. 60 tablet 0  . aspirin 325 MG tablet Take 1 tablet (325 mg total) by mouth daily. 30 tablet 0  . atorvastatin (LIPITOR) 20 MG tablet Take 1 tablet (20 mg total) by mouth daily at 6 PM. (Patient taking differently: Take 20 mg by mouth at bedtime. ) 90 tablet 3  . citalopram (CELEXA) 20 MG tablet Take 1 tablet (20 mg total) by mouth daily. 90 tablet 3  . metoprolol succinate (TOPROL-XL) 50 MG 24 hr tablet Take 1 tablet (50 mg total) by mouth daily with breakfast. 90 tablet 3  . mupirocin ointment (BACTROBAN) 2 % Place 1 application into the nose daily as needed (wound care). 22 g 0  . polyethylene glycol (MIRALAX / GLYCOLAX) packet Take 17 g by mouth daily as needed for mild constipation. 5 each 0  . traZODone (DESYREL) 100 MG tablet Take 1 tablet (100 mg total) by mouth at bedtime. 90 tablet 3   No current facility-administered medications for this visit.     Allergies: Codeine  Past Medical History:  Diagnosis Date  . Dementia   . Hyperlipidemia   . Hypertension   . Parkinson disease [facility])      Past Surgical History:  Procedure Laterality Date  . APPENDECTOMY    . JOINT REPLACEMENT     bilateral knee  . OVARIAN CYST SURGERY    . TONSILLECTOMY AND ADENOIDECTOMY      Family History  Problem Relation Age of Onset  . ALS Mother   . Heart disease Father   . Heart disease Maternal Grandfather   . Cancer Maternal Grandfather        breast    Social History   Tobacco Use  . Smoking status: Never Smoker  . Smokeless tobacco: Never Used  Substance Use Topics  . Alcohol use: No    Alcohol/week: 0.0 oz    Subjective:  Went to [facility] on Tuesday with concerns for UTI; care giver was able to get sample back for evaluation- started on Bactrim DS bid x 14 days; patent's caregiver notes that patient injured her left leg while getting up to urinate frequently with the recent UTI; asking for evaluation of wound today and would like to discuss getting home health to come evaluate patient; patient has marked dementia and her caregiver notes that behaviors are changing/ care at home becoming more difficult for the caregiver; caregiver wonders about stopping Lorazepam and going back to Alprazolam; she feels that Lorazepam not working and can actually irritate the patient more;   Objective:  Vitals:   04/10/17 1504  BP:  124/80  Pulse: 70  Temp: 98.7 F (37.1 C)  TempSrc: Oral  SpO2: 94%  Weight: 156 lb 0.2 oz (70.8 kg)  Height: 5' (1.524 m)    General: Well developed, well nourished, in no acute distress; seen in wheelchair today Skin : Warm and dry. Superficial laceration noted along left shin Head: Normocephalic and atraumatic  Eyes: Sclera and conjunctiva clear; pupils round and reactive to light; extraocular movements intact  Neurologic: Dementia noted in office; uses wheelchair for transport;  Assessment:  1. Dementia with behavioral disturbance, unspecified dementia type     Plan:  Home health referral updated as requested; feel that skilled nursing would be beneficial for  patient at this time as dementia seems to be worsening; UTI has recently been diagnosed and is being treated; d/c Lorazepam- change to Alprazolam; wound is dressed in office with Bactroban cream and coban- caregiver feels comfortable to cotinue treating at home; follow up as needed otherwise.   No follow-ups on file.  Orders Placed This Encounter  Procedures  . Ambulatory referral to Home Health    Referral Priority:   Routine    Referral Type:   Home Health Care    Referral Reason:   Specialty Services Required    Requested Specialty:   Home Health Services    Number of Visits Requested:   1    Requested Prescriptions   Signed Prescriptions Disp Refills  . mupirocin ointment (BACTROBAN) 2 % 22 g 0    Sig: Place 1 application into the nose daily as needed (wound care).  . ALPRAZolam (XANAX) 0.5 MG tablet 60 tablet 0    Sig: Take 1 tablet (0.5 mg total) by mouth 2 (two) times daily as needed for anxiety.

## 2017-04-13 ENCOUNTER — Inpatient Hospital Stay (HOSPITAL_COMMUNITY)
Admission: EM | Admit: 2017-04-13 | Discharge: 2017-04-17 | DRG: 690 | Disposition: A | Discharge status: Home / Self Care | Payer: Medicare Other | Attending: Family Medicine | Admitting: Family Medicine

## 2017-04-13 ENCOUNTER — Other Ambulatory Visit: Payer: Self-pay

## 2017-04-13 ENCOUNTER — Encounter (HOSPITAL_COMMUNITY): Payer: Self-pay | Admitting: Emergency Medicine

## 2017-04-13 DIAGNOSIS — G20 Parkinson's disease: Secondary | ICD-10-CM | POA: Diagnosis present

## 2017-04-13 DIAGNOSIS — Z87440 Personal history of urinary (tract) infections: Secondary | ICD-10-CM

## 2017-04-13 DIAGNOSIS — F0390 Unspecified dementia without behavioral disturbance: Secondary | ICD-10-CM | POA: Diagnosis present

## 2017-04-13 DIAGNOSIS — Z8673 Personal history of transient ischemic attack (TIA), and cerebral infarction without residual deficits: Secondary | ICD-10-CM | POA: Diagnosis not present

## 2017-04-13 DIAGNOSIS — N179 Acute kidney failure, unspecified: Secondary | ICD-10-CM | POA: Diagnosis present

## 2017-04-13 DIAGNOSIS — Z79899 Other long term (current) drug therapy: Secondary | ICD-10-CM

## 2017-04-13 DIAGNOSIS — I10 Essential (primary) hypertension: Secondary | ICD-10-CM | POA: Diagnosis present

## 2017-04-13 DIAGNOSIS — E785 Hyperlipidemia, unspecified: Secondary | ICD-10-CM | POA: Diagnosis present

## 2017-04-13 DIAGNOSIS — F0391 Unspecified dementia with behavioral disturbance: Secondary | ICD-10-CM | POA: Diagnosis not present

## 2017-04-13 DIAGNOSIS — Z885 Allergy status to narcotic agent status: Secondary | ICD-10-CM

## 2017-04-13 DIAGNOSIS — R4189 Other symptoms and signs involving cognitive functions and awareness: Secondary | ICD-10-CM | POA: Diagnosis present

## 2017-04-13 DIAGNOSIS — F05 Delirium due to known physiological condition: Secondary | ICD-10-CM | POA: Diagnosis not present

## 2017-04-13 DIAGNOSIS — Z8249 Family history of ischemic heart disease and other diseases of the circulatory system: Secondary | ICD-10-CM | POA: Diagnosis not present

## 2017-04-13 DIAGNOSIS — F329 Major depressive disorder, single episode, unspecified: Secondary | ICD-10-CM | POA: Diagnosis present

## 2017-04-13 DIAGNOSIS — M545 Low back pain: Secondary | ICD-10-CM | POA: Diagnosis present

## 2017-04-13 DIAGNOSIS — Z66 Do not resuscitate: Secondary | ICD-10-CM | POA: Diagnosis present

## 2017-04-13 DIAGNOSIS — E872 Acidosis: Secondary | ICD-10-CM | POA: Diagnosis present

## 2017-04-13 DIAGNOSIS — B9620 Unspecified Escherichia coli [E. coli] as the cause of diseases classified elsewhere: Secondary | ICD-10-CM | POA: Diagnosis present

## 2017-04-13 DIAGNOSIS — R531 Weakness: Secondary | ICD-10-CM

## 2017-04-13 DIAGNOSIS — N390 Urinary tract infection, site not specified: Principal | ICD-10-CM | POA: Diagnosis present

## 2017-04-13 DIAGNOSIS — F419 Anxiety disorder, unspecified: Secondary | ICD-10-CM | POA: Diagnosis present

## 2017-04-13 DIAGNOSIS — G9340 Encephalopathy, unspecified: Secondary | ICD-10-CM | POA: Diagnosis present

## 2017-04-13 DIAGNOSIS — Z96653 Presence of artificial knee joint, bilateral: Secondary | ICD-10-CM | POA: Diagnosis present

## 2017-04-13 DIAGNOSIS — Z1612 Extended spectrum beta lactamase (ESBL) resistance: Secondary | ICD-10-CM

## 2017-04-13 DIAGNOSIS — B9629 Other Escherichia coli [E. coli] as the cause of diseases classified elsewhere: Secondary | ICD-10-CM

## 2017-04-13 DIAGNOSIS — Z7982 Long term (current) use of aspirin: Secondary | ICD-10-CM

## 2017-04-13 DIAGNOSIS — G9349 Other encephalopathy: Secondary | ICD-10-CM | POA: Diagnosis present

## 2017-04-13 DIAGNOSIS — A498 Other bacterial infections of unspecified site: Secondary | ICD-10-CM

## 2017-04-13 DIAGNOSIS — R410 Disorientation, unspecified: Secondary | ICD-10-CM | POA: Diagnosis not present

## 2017-04-13 LAB — CBC WITH DIFFERENTIAL/PLATELET
BASOS ABS: 0.1 10*3/uL (ref 0.0–0.1)
BASOS PCT: 1 %
EOS ABS: 0.1 10*3/uL (ref 0.0–0.7)
Eosinophils Relative: 1 %
HEMATOCRIT: 42.2 % (ref 36.0–46.0)
Hemoglobin: 13.8 g/dL (ref 12.0–15.0)
Lymphocytes Relative: 27 %
Lymphs Abs: 1.4 10*3/uL (ref 0.7–4.0)
MCH: 29.6 pg (ref 26.0–34.0)
MCHC: 32.7 g/dL (ref 30.0–36.0)
MCV: 90.4 fL (ref 78.0–100.0)
MONO ABS: 1.2 10*3/uL — AB (ref 0.1–1.0)
MONOS PCT: 22 %
NEUTROS ABS: 2.5 10*3/uL (ref 1.7–7.7)
NEUTROS PCT: 49 %
Platelets: 256 10*3/uL (ref 150–400)
RBC: 4.67 MIL/uL (ref 3.87–5.11)
RDW: 14.2 % (ref 11.5–15.5)
WBC: 5.2 10*3/uL (ref 4.0–10.5)

## 2017-04-13 LAB — URINALYSIS, ROUTINE W REFLEX MICROSCOPIC
Bilirubin Urine: NEGATIVE
GLUCOSE, UA: NEGATIVE mg/dL
Hgb urine dipstick: NEGATIVE
Ketones, ur: NEGATIVE mg/dL
NITRITE: POSITIVE — AB
PH: 5 (ref 5.0–8.0)
Protein, ur: 30 mg/dL — AB
SPECIFIC GRAVITY, URINE: 1.016 (ref 1.005–1.030)

## 2017-04-13 LAB — COMPREHENSIVE METABOLIC PANEL
ALBUMIN: 3.7 g/dL (ref 3.5–5.0)
ALT: 28 U/L (ref 14–54)
ANION GAP: 12 (ref 5–15)
AST: 24 U/L (ref 15–41)
Alkaline Phosphatase: 78 U/L (ref 38–126)
BILIRUBIN TOTAL: 0.6 mg/dL (ref 0.3–1.2)
BUN: 17 mg/dL (ref 6–20)
CALCIUM: 9.8 mg/dL (ref 8.9–10.3)
CO2: 25 mmol/L (ref 22–32)
CREATININE: 1.2 mg/dL — AB (ref 0.44–1.00)
Chloride: 101 mmol/L (ref 101–111)
GFR calc non Af Amer: 39 mL/min — ABNORMAL LOW (ref >60)
GFR, EST AFRICAN AMERICAN: 45 mL/min — AB (ref >60)
GLUCOSE: 136 mg/dL — AB (ref 65–99)
Potassium: 4.6 mmol/L (ref 3.5–5.1)
SODIUM: 138 mmol/L (ref 135–145)
TOTAL PROTEIN: 7.4 g/dL (ref 6.5–8.1)

## 2017-04-13 LAB — I-STAT CG4 LACTIC ACID, ED: LACTIC ACID, VENOUS: 2.02 mmol/L — AB (ref 0.5–1.9)

## 2017-04-13 MED ORDER — ONDANSETRON HCL 4 MG PO TABS: 4.0000 mg | ORAL_TABLET | Freq: Four times a day (QID) | ORAL | Status: DC | PRN

## 2017-04-13 MED ORDER — POLYETHYLENE GLYCOL 3350 17 G PO PACK: 17.0000 g | PACK | Freq: Every day | ORAL | Status: DC | PRN

## 2017-04-13 MED ORDER — MEROPENEM 1 G IV SOLR
1.0000 g | Freq: Once | INTRAVENOUS | Status: AC
  Administered 2017-04-13: 1 g via INTRAVENOUS
  Filled 2017-04-13: qty 1

## 2017-04-13 MED ORDER — ACETAMINOPHEN 325 MG PO TABS: 650.0000 mg | ORAL_TABLET | Freq: Four times a day (QID) | ORAL | Status: DC | PRN

## 2017-04-13 MED ORDER — CITALOPRAM HYDROBROMIDE 20 MG PO TABS
20.0000 mg | ORAL_TABLET | Freq: Every day | ORAL | Status: DC
  Administered 2017-04-14 – 2017-04-17 (×4): 20 mg via ORAL
  Filled 2017-04-13 (×4): qty 1

## 2017-04-13 MED ORDER — ATORVASTATIN CALCIUM 20 MG PO TABS
20.0000 mg | ORAL_TABLET | Freq: Every day | ORAL | Status: DC
  Administered 2017-04-14 – 2017-04-16 (×4): 20 mg via ORAL
  Filled 2017-04-13 (×4): qty 1

## 2017-04-13 MED ORDER — SODIUM CHLORIDE 0.9% FLUSH
3.0000 mL | Freq: Two times a day (BID) | INTRAVENOUS | Status: DC
  Administered 2017-04-13 – 2017-04-17 (×8): 3 mL via INTRAVENOUS

## 2017-04-13 MED ORDER — ONDANSETRON HCL 4 MG/2ML IJ SOLN: 4.0000 mg | Freq: Four times a day (QID) | INTRAMUSCULAR | Status: DC | PRN

## 2017-04-13 MED ORDER — LACTATED RINGERS IV BOLUS
1000.0000 mL | Freq: Once | INTRAVENOUS | Status: AC
  Administered 2017-04-13: 1000 mL via INTRAVENOUS

## 2017-04-13 MED ORDER — ACETAMINOPHEN 650 MG RE SUPP: 650.0000 mg | Freq: Four times a day (QID) | RECTAL | Status: DC | PRN

## 2017-04-13 MED ORDER — METOPROLOL SUCCINATE ER 50 MG PO TB24
50.0000 mg | ORAL_TABLET | Freq: Every day | ORAL | Status: DC
  Administered 2017-04-14 – 2017-04-17 (×4): 50 mg via ORAL
  Filled 2017-04-13 (×4): qty 1

## 2017-04-13 MED ORDER — SODIUM CHLORIDE 0.9 % IV SOLN: 250.0000 mL | INTRAVENOUS | Status: DC | PRN

## 2017-04-13 MED ORDER — SODIUM CHLORIDE 0.9% FLUSH: 3.0000 mL | INTRAVENOUS | Status: DC | PRN

## 2017-04-13 MED ORDER — ALBUTEROL SULFATE (2.5 MG/3ML) 0.083% IN NEBU: 2.5000 mg | INHALATION_SOLUTION | Freq: Four times a day (QID) | RESPIRATORY_TRACT | Status: DC | PRN

## 2017-04-13 MED ORDER — ASPIRIN 325 MG PO TABS
325.0000 mg | ORAL_TABLET | Freq: Every day | ORAL | Status: DC
  Administered 2017-04-14 – 2017-04-17 (×4): 325 mg via ORAL
  Filled 2017-04-13 (×4): qty 1

## 2017-04-13 MED ORDER — TRAZODONE HCL 100 MG PO TABS
100.0000 mg | ORAL_TABLET | Freq: Every day | ORAL | Status: DC
  Administered 2017-04-13 – 2017-04-16 (×4): 100 mg via ORAL
  Filled 2017-04-13: qty 2
  Filled 2017-04-13 (×3): qty 1

## 2017-04-13 MED ORDER — ENOXAPARIN SODIUM 30 MG/0.3ML ~~LOC~~ SOLN
30.0000 mg | SUBCUTANEOUS | Status: DC
  Administered 2017-04-14: 30 mg via SUBCUTANEOUS
  Filled 2017-04-13: qty 0.3

## 2017-04-13 MED ORDER — SODIUM CHLORIDE 0.9 % IV SOLN
1.0000 g | Freq: Two times a day (BID) | INTRAVENOUS | Status: DC
  Administered 2017-04-14 – 2017-04-17 (×7): 1 g via INTRAVENOUS
  Filled 2017-04-13 (×8): qty 1

## 2017-04-13 MED ORDER — ALPRAZOLAM 0.5 MG PO TABS
0.5000 mg | ORAL_TABLET | Freq: Two times a day (BID) | ORAL | Status: DC | PRN
  Administered 2017-04-14 – 2017-04-17 (×3): 0.5 mg via ORAL
  Filled 2017-04-13 (×3): qty 1

## 2017-04-13 MED ORDER — HYDROCODONE-ACETAMINOPHEN 5-325 MG PO TABS: 1.0000 | ORAL_TABLET | ORAL | Status: DC | PRN

## 2017-04-13 NOTE — ED Triage Notes (Signed)
Provider bedside, spoke to provider, needs labs first.

## 2017-04-13 NOTE — Progress Notes (Signed)
Pharmacy Antibiotic Note  Bonnie NorthDorothy J Mora is a 84 y.o. female admitted on 04/13/2017 with confusion and disorientation from recurrent UTI.  She has a history of ESBL E.coli UTI.  Pharmacy has been consulted for Merrem dosing.    Patient has renal dysfunction. Afebrile, WBC WNL, LA 2.02, UA dirty.   Plan: Merrem 1gm IV Q12H Monitor renal fxn, micro data, abx LOT     Temp (24hrs), Avg:97.5 F (36.4 C), Min:97.5 F (36.4 C), Max:97.5 F (36.4 C)  Recent Labs  Lab 04/13/17 2003 04/13/17 2100  WBC 5.2  --   CREATININE 1.20*  --   LATICACIDVEN  --  2.02*    Estimated Creatinine Clearance: 28.4 mL/min (A) (by C-G formula based on SCr of 1.2 mg/dL (H)).    Allergies  Allergen Reactions  . Codeine Nausea And Vomiting     Merrem 3/25 >> Bactrim PTA  3/25 BCx -  3/25 UCx -    Bonnie Mora D. Bonnie Mora, PharmD, BCPS Pager:  [phone] 04/13/2017, 10:37 PM

## 2017-04-13 NOTE — H&P (Signed)
History and Physical    Bonnie NorthDorothy J Romick ZOX:[id number] DOB: 04-19-1926 DOA: 04/13/2017  PCP: Myrlene Brokerrawford, Elizabeth A, MD   Patient coming from: Home   Chief Complaint: Increased confusion, outpatient urine culture with ESBL   HPI: Bonnie Mora is a 84 y.o. female with medical history significant for history of CVA, dementia, anxiety, and hypertension, now presenting to the emergency department for evaluation of increased confusion and UTI with outpatient culture growing ESBL.  Patient was admitted to the hospital last month with ESBL UTI, improved, but developed a recurrence in her symptoms and was recently started on Bactrim.  Despite this, her confusion has continued to worsen and she has been complaining of low back pain.  Caregiver was notified by the patient's PCP today that the urine culture was again growing ESBL, and it was recommended that she go to the hospital for treatment.  No fevers have been documented and aside from low back pain, the patient has not had any complaints.  ED Course: Upon arrival to the ED, patient is found to be afebrile, saturating well on room air, and with vitals otherwise normal.  Chemistry panel is notable for a creatinine of 1.20, up from 0.9 last month.  CBC is unremarkable.  Lactic acid is slightly elevated. Urinalysis is nitrite positive.  Blood and urine cultures were collected in the emergency department, 1 L of normal saline was administered, and the patient was started on meropenem.  She remains hemodynamically stable, in no apparent respiratory distress, and will be admitted to the medical-surgical unit for ongoing evaluation and management of recurrent ESBL UTI.  Review of Systems:  All other systems reviewed and apart from HPI, are negative.  Past Medical History:  Diagnosis Date  . Dementia   . Hyperlipidemia   . Hypertension   . Parkinson disease [facility](HCC)     Past Surgical History:  Procedure Laterality Date  . APPENDECTOMY    . JOINT  REPLACEMENT     bilateral knee  . OVARIAN CYST SURGERY    . TONSILLECTOMY AND ADENOIDECTOMY       reports that she has never smoked. She has never used smokeless tobacco. She reports that she does not drink alcohol or use drugs.  Allergies  Allergen Reactions  . Codeine Nausea And Vomiting    Family History  Problem Relation Age of Onset  . ALS Mother   . Heart disease Father   . Heart disease Maternal Grandfather   . Cancer Maternal Grandfather        breast     Prior to Admission medications   Medication Sig Start Date End Date Taking? Authorizing Provider  albuterol (PROVENTIL HFA;VENTOLIN HFA) 108 (90 Base) MCG/ACT inhaler Inhale 2 puffs into the lungs every 6 (six) hours as needed for wheezing or shortness of breath. 05/14/15   Allegra GranaArnett, Margaret G, FNP  ALPRAZolam Prudy Feeler(XANAX) 0.5 MG tablet Take 1 tablet (0.5 mg total) by mouth 2 (two) times daily as needed for anxiety. 04/10/17   Olive BassMurray, Laura Woodruff, FNP  aspirin 325 MG tablet Take 1 tablet (325 mg total) by mouth daily. 03/10/17   Glade LloydAlekh, Kshitiz, MD  atorvastatin (LIPITOR) 20 MG tablet Take 1 tablet (20 mg total) by mouth daily at 6 PM. Patient taking differently: Take 20 mg by mouth at bedtime.  04/25/16   Myrlene Brokerrawford, Elizabeth A, MD  citalopram (CELEXA) 20 MG tablet Take 1 tablet (20 mg total) by mouth daily. 04/25/16   Myrlene Brokerrawford, Elizabeth A, MD  metoprolol succinate (TOPROL-XL)  50 MG 24 hr tablet Take 1 tablet (50 mg total) by mouth daily with breakfast. 04/25/16   [name], MD  mupirocin ointment (BACTROBAN) 2 % Place 1 application into the nose daily as needed (wound care). 04/10/17   Olive Bass, FNP  polyethylene glycol [facility]) packet Take 17 g by mouth daily as needed for mild constipation. 03/10/17   [name], MD  sulfamethoxazole-trimethoprim (BACTRIM DS,SEPTRA DS) 800-160 MG tablet Take 1 tablet by mouth 2 (two) times daily.    [provider]  traZODone (DESYREL) 100 MG tablet  Take 1 tablet (100 mg total) by mouth at bedtime. 04/25/16   [name], MD    Physical Exam: Vitals:   04/13/17 1858  BP: 124/69  Pulse: 79  Resp: 14  Temp: (!) 97.5 F (36.4 C)  TempSrc: Oral  SpO2: 100%      Constitutional: NAD, calm Eyes: PERTLA, lids and conjunctivae normal ENMT: Mucous membranes are moist. Posterior pharynx clear of any exudate or lesions.   Neck: normal, supple, no masses, no thyromegaly Respiratory: clear to auscultation bilaterally, no wheezing, no crackles. Normal respiratory effort.   Cardiovascular: S1 & S2 heard, regular rate and rhythm. No significant JVD. Abdomen: No distension, suprapubic tenderness, no CVA tenderness, soft. Bowel sounds normal.  Musculoskeletal: no clubbing / cyanosis. No joint deformity upper and lower extremities.   Skin: no significant rashes, lesions, ulcers. Warm, dry, well-perfused. Neurologic: No facial asymmetry. Sensation intact. Moving all extremities.  Psychiatric: Alert, oriented to person and place only. Cooperative.     Labs on Admission: I have personally reviewed following labs and imaging studies  CBC: Recent Labs  Lab 04/13/17 2003  WBC 5.2  NEUTROABS 2.5  HGB 13.8  HCT 42.2  MCV 90.4  PLT 256   Basic Metabolic Panel: Recent Labs  Lab 04/13/17 2003  NA 138  K 4.6  CL 101  CO2 25  GLUCOSE 136*  BUN 17  CREATININE 1.20*  CALCIUM 9.8   GFR: Estimated Creatinine Clearance: 28.4 mL/min (A) (by C-G formula based on SCr of 1.2 mg/dL (H)). Liver Function Tests: Recent Labs  Lab 04/13/17 2003  AST 24  ALT 28  ALKPHOS 78  BILITOT 0.6  PROT 7.4  ALBUMIN 3.7   No results for input(s): LIPASE, AMYLASE in the last 168 hours. No results for input(s): AMMONIA in the last 168 hours. Coagulation Profile: No results for input(s): INR, PROTIME in the last 168 hours. Cardiac Enzymes: No results for input(s): CKTOTAL, CKMB, CKMBINDEX, TROPONINI in the last 168 hours. BNP (last 3  results) No results for input(s): PROBNP in the last 8760 hours. HbA1C: No results for input(s): HGBA1C in the last 72 hours. CBG: No results for input(s): GLUCAP in the last 168 hours. Lipid Profile: No results for input(s): CHOL, HDL, LDLCALC, TRIG, CHOLHDL, LDLDIRECT in the last 72 hours. Thyroid Function Tests: No results for input(s): TSH, T4TOTAL, FREET4, T3FREE, THYROIDAB in the last 72 hours. Anemia Panel: No results for input(s): VITAMINB12, FOLATE, FERRITIN, TIBC, IRON, RETICCTPCT in the last 72 hours. Urine analysis:    Component Value Date/Time   COLORURINE YELLOW 04/13/2017 2000   APPEARANCEUR CLOUDY (A) 04/13/2017 2000   LABSPEC 1.016 04/13/2017 2000   PHURINE 5.0 04/13/2017 2000   GLUCOSEU NEGATIVE 04/13/2017 2000   HGBUR NEGATIVE 04/13/2017 2000   BILIRUBINUR NEGATIVE 04/13/2017 2000   BILIRUBINUR neg 11/03/2016 1634   KETONESUR NEGATIVE 04/13/2017 2000   PROTEINUR 30 (A) 04/13/2017 2000  UROBILINOGEN 0.2 11/03/2016 1634   UROBILINOGEN 0.2 03/10/2014 0931   NITRITE POSITIVE (A) 04/13/2017 2000   LEUKOCYTESUR TRACE (A) 04/13/2017 2000   Sepsis Labs: @LABRCNTIP (procalcitonin:4,lacticidven:4) )No results found for this or any previous visit (from the past 240 hour(s)).   Radiological Exams on Admission: No results found.  EKG: Not performed.   Assessment/Plan  1. ESBL UTI  - Presents with increased confusion at direction of her PCP after recent urine culture grew ESBL E coli  - She was admitted last month with UTI secondary to ESBL, suffered recent recurrence in sxs, was started on Bactrim, but culture now growing ESBL again  - She is not septic, but has increased confusion and low back pain  - Blood and urine cultures collected in ED and meropenem started  - Continue meropenem; may require 10-14 days treatment    2. Dementia  - Has been more confused past several days per caregiver, likely from acute infection, no focal deficits identified   3.  Hypertension  - BP at goal, continue Toprol    4. History of CVA  - No acute deficits   - Continue daily ASA 325 and statin   5. Mild renal insufficiency  - SCr is 1.20 on admission, up from recent prior in 0.9 range  - Possibly from Bactrim and/or dehydration  - Treated with 1 liter NS in ED  - Renally-dose medications, avoid nephrotoxins, repeat chem panel in am   - If fails to improve with IVF, may need imaging    DVT prophylaxis: Lovenox Code Status: DNR Family Communication: Caregiver updated at bedside Consults called: none Admission status: Inpatient    Briscoe Deutscher, MD [facility] Pager [phone]  If 7PM-7AM, please contact night-coverage [email or web address] Password [facility]  04/13/2017, 10:22 PM

## 2017-04-13 NOTE — ED Triage Notes (Addendum)
Pt received notice from Dr.s office b/c her urine was tested and it has ecoli in it and she needs IV antibiotics.  Pt is altered mental status (more than normal), pt had dementia.  Frequent urination and voices trouble going.

## 2017-04-13 NOTE — ED Provider Notes (Signed)
[facility] EMERGENCY DEPARTMENT Provider Note   CSN: [id number] Arrival date & time: 04/13/17  1821     History   Chief Complaint Chief Complaint  Patient presents with  . Recurrent UTI    HPI Bonnie Mora is a 84 y.o. female.    84yo F w/ PMH including ESBL UTI, dementia, Parkinson's disease, HTN who p/w recurrent UTI. She was admitted last month for UTI. Caregiver reports that after discharge, she was well for about 2 weeks. One week ago she woke up disoriented and "off". 5 days ago she was tested for UTI and it was positive; she was started on bactrim pending culture. They were contacted today and told she needed to be admitted for IV antibiotics. She has had severe disorientation and confusion with talking to things. She has been severely generally weak, requiring a lot of assistance. She has stated her back is hurting her and she has had nausea. No fevers, vomiting.  LEVEL 5 CAVEAT DUE TO DEMENTIA     Past Medical History:  Diagnosis Date  . Dementia   . Hyperlipidemia   . Hypertension   . Parkinson disease [facility])     Patient Active Problem List   Diagnosis Date Noted  . Hx of completed stroke 03/08/2017  . Acute encephalopathy 03/06/2017  . Acute lower UTI 03/06/2017  . Chronic left shoulder pain 08/19/2016  . Routine general medical examination at a health care facility 04/25/2016  . Dementia 03/10/2014  . HLD (hyperlipidemia) 03/10/2014  . Essential hypertension 03/10/2014    Past Surgical History:  Procedure Laterality Date  . APPENDECTOMY    . JOINT REPLACEMENT     bilateral knee  . OVARIAN CYST SURGERY    . TONSILLECTOMY AND ADENOIDECTOMY       OB History   None      Home Medications    Prior to Admission medications   Medication Sig Start Date End Date Taking? Authorizing Provider  albuterol (PROVENTIL HFA;VENTOLIN HFA) 108 (90 Base) MCG/ACT inhaler Inhale 2 puffs into the lungs every 6 (six) hours as needed for  wheezing or shortness of breath. 05/14/15   Allegra Grana, FNP  ALPRAZolam Prudy Feeler) 0.5 MG tablet Take 1 tablet (0.5 mg total) by mouth 2 (two) times daily as needed for anxiety. 04/10/17   Olive Bass, FNP  aspirin 325 MG tablet Take 1 tablet (325 mg total) by mouth daily. 03/10/17   [name], MD  atorvastatin (LIPITOR) 20 MG tablet Take 1 tablet (20 mg total) by mouth daily at 6 PM. Patient taking differently: Take 20 mg by mouth at bedtime.  04/25/16   [name], MD  citalopram (CELEXA) 20 MG tablet Take 1 tablet (20 mg total) by mouth daily. 04/25/16   [name], MD  metoprolol succinate (TOPROL-XL) 50 MG 24 hr tablet Take 1 tablet (50 mg total) by mouth daily with breakfast. 04/25/16   [name], MD  mupirocin ointment (BACTROBAN) 2 % Place 1 application into the nose daily as needed (wound care). 04/10/17   Olive Bass, FNP  polyethylene glycol [facility] Ethelene Hal) packet Take 17 g by mouth daily as needed for mild constipation. 03/10/17   [name], MD  sulfamethoxazole-trimethoprim (BACTRIM DS,SEPTRA DS) 800-160 MG tablet Take 1 tablet by mouth 2 (two) times daily.    [provider]  traZODone (DESYREL) 100 MG tablet Take 1 tablet (100 mg total) by mouth at bedtime. 04/25/16   Hillard Danker  A, MD    Family History Family History  Problem Relation Age of Onset  . ALS Mother   . Heart disease Father   . Heart disease Maternal Grandfather   . Cancer Maternal Grandfather        breast    Social History Social History   Tobacco Use  . Smoking status: Never Smoker  . Smokeless tobacco: Never Used  Substance Use Topics  . Alcohol use: No    Alcohol/week: 0.0 oz  . Drug use: No     Allergies   Codeine   Review of Systems Review of Systems  Unable to perform ROS: Dementia     Physical Exam Updated Vital Signs BP 124/69 (BP Location: Left Arm)   Pulse 79   Temp (!) 97.5 F (36.4 C) (Oral)    Resp 14   SpO2 100%   Physical Exam  Constitutional: She appears well-developed and well-nourished. No distress.  HENT:  Head: Normocephalic and atraumatic.  Moist mucous membranes  Eyes: Pupils are equal, round, and reactive to light. Conjunctivae are normal.  Neck: Neck supple.  Cardiovascular: Normal rate, regular rhythm and normal heart sounds.  No murmur heard. Pulmonary/Chest: Effort normal and breath sounds normal.  Abdominal: Soft. Bowel sounds are normal. She exhibits no distension. There is no tenderness.  Musculoskeletal: She exhibits no edema.  Neurological: She is alert.  Oriented to person  Skin: Skin is warm and dry.  Small skin tear L lower leg  Nursing note and vitals reviewed.    ED Treatments / Results  Labs (all labs ordered are listed, but only abnormal results are displayed) Labs Reviewed  COMPREHENSIVE METABOLIC PANEL - Abnormal; Notable for the following components:      Result Value   Glucose, Bld 136 (*)    Creatinine, Ser 1.20 (*)    GFR calc non Af Amer 39 (*)    GFR calc Af Amer 45 (*)    All other components within normal limits  CBC WITH DIFFERENTIAL/PLATELET - Abnormal; Notable for the following components:   Monocytes Absolute 1.2 (*)    All other components within normal limits  URINALYSIS, ROUTINE W REFLEX MICROSCOPIC - Abnormal; Notable for the following components:   APPearance CLOUDY (*)    Protein, ur 30 (*)    Nitrite POSITIVE (*)    Leukocytes, UA TRACE (*)    Bacteria, UA MANY (*)    Squamous Epithelial / LPF 6-30 (*)    All other components within normal limits  I-STAT CG4 LACTIC ACID, ED - Abnormal; Notable for the following components:   Lactic Acid, Venous 2.02 (*)    All other components within normal limits  URINE CULTURE  CULTURE, BLOOD (ROUTINE X 2)  CULTURE, BLOOD (ROUTINE X 2)  I-STAT CG4 LACTIC ACID, ED    EKG None  Radiology No results found.  Procedures Procedures (including critical care  time)  Medications Ordered in ED Medications  lactated ringers bolus 1,000 mL (has no administration in time range)  meropenem (MERREM) 1 g in sodium chloride 0.9 % 100 mL IVPB (has no administration in time range)  traZODone (DESYREL) tablet 100 mg (has no administration in time range)     Initial Impression / Assessment and Plan / ED Course  I have reviewed the triage vital signs and the nursing notes.  Pertinent labs & imaging results that were available during my care of the patient were reviewed by me and considered in my medical decision making (see chart  for details).    PT w/ h/o ESBL UTI last month p/w worsening confusion and weakness in setting of recent initiation of bactrim for UTI.  Nontoxic on exam with reassuring vital signs.  Labs show lactate mildly above cut off at 2.02, creatinine 1.2, normal CBC.  I reviewed her culture data which shows ESBL on previous urinary culture.  Gave meropenem and IV fluid bolus.  Discussed admission with [facility], Dr. Antionette Char, and pt admitted for further treatment.  Final Clinical Impressions(s) / ED Diagnoses   Final diagnoses:  Infection due to ESBL-producing Escherichia coli  Complicated UTI (urinary tract infection)  Confusion  Weakness    ED Discharge Orders    None       Little, Ambrose Finland, MD 04/13/17 2212

## 2017-04-13 NOTE — ED Provider Notes (Signed)
Patient placed in Quick Look pathway, seen and evaluated   Chief Complaint: UTI  HPI:   Bonnie Mora is a 84 y.o. female with hx of dementia, HTN and Parkinson's who presents to the ED with her caregiver after patient's PCP called today and told them that [facility] antibiotics for UTI. Patient has been on Bactrim but symptoms have gotten worse.   ROS: patient keeps saying over and over my back hurts.   Physical Exam:  BP 124/69 (BP Location: Left Arm)   Pulse 79   Temp (!) 97.5 F (36.4 C) (Oral)   Resp 14   SpO2 100%    Gen: No distress  Neuro: Awake and Alert  Skin: Warm and dry  Abdomen soft, non tender, no CVA tenderness.  Focused Exam:    Initiation of care has begun. The patient has been counseled on the process, plan, and necessity for staying for the completion/evaluation, and the remainder of the medical screening examination    Janne Napoleoneese, Hope M, NP 04/13/17 2007    Arby BarrettePfeiffer, Marcy, MD 04/25/17 [id number]

## 2017-04-14 ENCOUNTER — Encounter (HOSPITAL_COMMUNITY): Payer: Self-pay | Admitting: *Deleted

## 2017-04-14 LAB — CBC WITH DIFFERENTIAL/PLATELET
Basophils Absolute: 0 10*3/uL (ref 0.0–0.1)
Basophils Relative: 1 %
EOS ABS: 0 10*3/uL (ref 0.0–0.7)
Eosinophils Relative: 1 %
HCT: 38.9 % (ref 36.0–46.0)
Hemoglobin: 12.5 g/dL (ref 12.0–15.0)
LYMPHS ABS: 1.7 10*3/uL (ref 0.7–4.0)
LYMPHS PCT: 37 %
MCH: 29.3 pg (ref 26.0–34.0)
MCHC: 32.1 g/dL (ref 30.0–36.0)
MCV: 91.1 fL (ref 78.0–100.0)
MONO ABS: 0.9 10*3/uL (ref 0.1–1.0)
Monocytes Relative: 19 %
Neutro Abs: 2 10*3/uL (ref 1.7–7.7)
Neutrophils Relative %: 42 %
PLATELETS: 213 10*3/uL (ref 150–400)
RBC: 4.27 MIL/uL (ref 3.87–5.11)
RDW: 14.5 % (ref 11.5–15.5)
WBC: 4.7 10*3/uL (ref 4.0–10.5)

## 2017-04-14 LAB — BASIC METABOLIC PANEL
Anion gap: 11 (ref 5–15)
BUN: 16 mg/dL (ref 6–20)
CHLORIDE: 104 mmol/L (ref 101–111)
CO2: 23 mmol/L (ref 22–32)
CREATININE: 1 mg/dL (ref 0.44–1.00)
Calcium: 8.9 mg/dL (ref 8.9–10.3)
GFR calc Af Amer: 57 mL/min — ABNORMAL LOW (ref >60)
GFR, EST NON AFRICAN AMERICAN: 49 mL/min — AB (ref >60)
GLUCOSE: 104 mg/dL — AB (ref 65–99)
POTASSIUM: 4.5 mmol/L (ref 3.5–5.1)
SODIUM: 138 mmol/L (ref 135–145)

## 2017-04-14 LAB — LACTIC ACID, PLASMA: LACTIC ACID, VENOUS: 1 mmol/L (ref 0.5–1.9)

## 2017-04-14 MED ORDER — SODIUM CHLORIDE 0.9 % IV SOLN
INTRAVENOUS | Status: DC
  Administered 2017-04-14: 14:00:00 via INTRAVENOUS

## 2017-04-14 MED ORDER — ENOXAPARIN SODIUM 40 MG/0.4ML ~~LOC~~ SOLN
40.0000 mg | SUBCUTANEOUS | Status: DC
  Administered 2017-04-15 – 2017-04-17 (×3): 40 mg via SUBCUTANEOUS
  Filled 2017-04-14 (×3): qty 0.4

## 2017-04-14 NOTE — Progress Notes (Signed)
PROGRESS NOTE    Bonnie Mora  ZOX:[id number] DOB: [birth year] DOA: 04/13/2017 PCP: [name], MD   Brief Narrative: Patient is a 84 year old female with past medical history of CVA, dementia, hypertension who presents to the emergency department for the evaluation of increased confusion.  Patient was evaluated at urgent care a few days ago for urinary retention.  Urine retention resolved but urine culture sent at the time was reported to grow ESBL bacteria.  Patient was recently admitted here for CVA and ESBL UTI (treated with 7 days course of Meropenem)and was discharged to skilled nursing facility .  Patient admitted this time for management of possible ESBL UTI.  Urine culture has been sent.  Assessment & Plan:   Principal Problem:   Acute lower UTI Active Problems:   Dementia   Essential hypertension   Acute encephalopathy   Hx of completed stroke   UTI due to extended-spectrum beta lactamase (ESBL) producing Escherichia coli  Urinary tract infection:She has H/O recurrent UTI.  Urinalysis done in ED suggestive of UTI  .  No report of fever, chills or lower urinary tract symptoms. She was treated with Bactrim for UTI as an outpatient. Urine culture collected as an outpatient was found to have ESBL bacteria so she was sent to ED. This could also be colonization.  We have sent repeat urine culture here.  We will follow that. Started on meropenem. She is hemodynamically stable.  She is afebrile and her white cell counts are stable. Discussed with on-call infectious disease attending, Dr. Orvan Falconer.  As per the discussion, there is really not on an accurate way to determine if this is a true infection or colonization.  If her mental status continues to improve then it is reasonable to consider this as a new infection and continue meropenem for total of 5-7 days course.  At the meantime will also follow-up urine culture that has been sent here.  Dementia: As per the daughter,  patient has pretty significant dementia .She gets confused on and off.  Patient has high risk for developing delirium/agitation. I have requested for family to be on the bedside, make her room brighter with the light. Try to minimize benzodiazepines.  If she continues to be confused/agitated, we can try on low-dose Zyprexa.  History of depression/anxiety: On Celexa, trazodone and as needed Xanax.  History of CVA: No acute deficits.  Continue aspirin 325 mg daily and statin.  Renal insufficiency: Currently kidney function is on baseline.  Continue gentle IV fluids.  Hypertension: Continue metoprolol.  Blood pressure stable.  Hyperlipidemia: Continue statin  Lactic acidosis: Mild.  Continue gentle IV fluids    DVT prophylaxis:  Lovenox Code Status: DNR Family Communication: Daughter present on the bedside Disposition Plan: Home after resolution of UTI and confusion   Consultants: None.  Phone consult with ID  Procedures: None  Antimicrobials: Meropenem since 04/13/17  Subjective: Patient seen and examined the bedside this morning. Looks comfortable.  No complaints.  Not agitated but confused.  Daughter was present on the bedside.  Objective: Vitals:   04/13/17 2303 04/13/17 2320 04/14/17 0001 04/14/17 0500  BP: (!) 162/83 (!) 112/95 133/78 (!) 114/94  Pulse: 78 85 77 60  Resp: (!) 23 19 18 17   Temp:   97.7 F (36.5 C) 98 F (36.7 C)  TempSrc:   Oral Oral  SpO2: 96% 95% 94% 92%    Intake/Output Summary (Last 24 hours) at 04/14/2017 1059 Last data filed at 04/14/2017 0900 Gross per  24 hour  Intake 220 ml  Output -  Net 220 ml   There were no vitals filed for this visit.  Examination:  General exam: Appears calm and comfortable ,Not in distress,average built, pleasantly demented HEENT:PERRL,Oral mucosa moist, Ear/Nose normal on gross exam Respiratory system: Bilateral equal air entry, normal vesicular breath sounds, no wheezes or crackles  Cardiovascular system: S1  & S2 heard, RRR. No JVD, murmurs, rubs, gallops or clicks. No pedal edema. Gastrointestinal system: Abdomen is nondistended, soft and nontender. No organomegaly or masses felt. Normal bowel sounds heard. Central nervous system: Alert but not  oriented. No focal neurological deficits. Extremities: No edema, no clubbing ,no cyanosis, distal peripheral pulses palpable. Skin: No rashes, lesions or ulcers,no icterus ,no pallor   Data Reviewed: I have personally reviewed following labs and imaging studies  CBC: Recent Labs  Lab 04/13/17 2003 04/14/17 0536  WBC 5.2 4.7  NEUTROABS 2.5 2.0  HGB 13.8 12.5  HCT 42.2 38.9  MCV 90.4 91.1  PLT 256 213   Basic Metabolic Panel: Recent Labs  Lab 04/13/17 2003 04/14/17 0536  NA 138 138  K 4.6 4.5  CL 101 104  CO2 25 23  GLUCOSE 136* 104*  BUN 17 16  CREATININE 1.20* 1.00  CALCIUM 9.8 8.9   GFR: Estimated Creatinine Clearance: 34.1 mL/min (by C-G formula based on SCr of 1 mg/dL). Liver Function Tests: Recent Labs  Lab 04/13/17 2003  AST 24  ALT 28  ALKPHOS 78  BILITOT 0.6  PROT 7.4  ALBUMIN 3.7   No results for input(s): LIPASE, AMYLASE in the last 168 hours. No results for input(s): AMMONIA in the last 168 hours. Coagulation Profile: No results for input(s): INR, PROTIME in the last 168 hours. Cardiac Enzymes: No results for input(s): CKTOTAL, CKMB, CKMBINDEX, TROPONINI in the last 168 hours. BNP (last 3 results) No results for input(s): PROBNP in the last 8760 hours. HbA1C: No results for input(s): HGBA1C in the last 72 hours. CBG: No results for input(s): GLUCAP in the last 168 hours. Lipid Profile: No results for input(s): CHOL, HDL, LDLCALC, TRIG, CHOLHDL, LDLDIRECT in the last 72 hours. Thyroid Function Tests: No results for input(s): TSH, T4TOTAL, FREET4, T3FREE, THYROIDAB in the last 72 hours. Anemia Panel: No results for input(s): VITAMINB12, FOLATE, FERRITIN, TIBC, IRON, RETICCTPCT in the last 72 hours. Sepsis  Labs: Recent Labs  Lab 04/13/17 2100  LATICACIDVEN 2.02*    Recent Results (from the past 240 hour(s))  Culture, blood (routine x 2)     Status: None (Preliminary result)   Collection Time: 04/13/17 10:15 PM  Result Value Ref Range Status   Specimen Description BLOOD LEFT HAND  Final   Special Requests   Final    BOTTLES DRAWN AEROBIC AND ANAEROBIC Blood Culture adequate volume   Culture   Final    NO GROWTH < 12 HOURS Performed at [facility], [address] [place], [place] [address]    Report Status PENDING  Incomplete  Culture, blood (routine x 2)     Status: None (Preliminary result)   Collection Time: 04/13/17 10:18 PM  Result Value Ref Range Status   Specimen Description BLOOD RIGHT HAND  Final   Special Requests IN PEDIATRIC BOTTLE Blood Culture adequate volume  Final   Culture   Final    NO GROWTH < 12 HOURS Performed at [facility], [address] [place], [place] [address]    Report Status PENDING  Incomplete  Radiology Studies: No results found.      Scheduled Meds: . aspirin  325 mg Oral Daily  . atorvastatin  20 mg Oral QHS  . citalopram  20 mg Oral Daily  . enoxaparin (LOVENOX) injection  30 mg Subcutaneous Q24H  . metoprolol succinate  50 mg Oral Q breakfast  . sodium chloride flush  3 mL Intravenous Q12H  . traZODone  100 mg Oral QHS   Continuous Infusions: . sodium chloride    . meropenem (MERREM) IV 1 g (04/14/17 1045)     LOS: 1 day    Time spent: More than 50% of that time was spent in counseling and/or coordination of care.      Burnadette Pop, MD [facility] Pager [phone]  If 7PM-7AM, please contact night-coverage [email or web address] Password [facility] 04/14/2017, 10:59 AM

## 2017-04-15 ENCOUNTER — Other Ambulatory Visit: Payer: Self-pay

## 2017-04-15 DIAGNOSIS — N390 Urinary tract infection, site not specified: Principal | ICD-10-CM

## 2017-04-15 LAB — URINE CULTURE

## 2017-04-15 NOTE — Progress Notes (Signed)
Patient Demographics:    Bonnie Mora, is a 84 y.o. female, DOB - Oct 30, 1926, BJY:[id number]  Admit date - 04/13/2017   Admitting Physician Briscoe Deutscherimothy S Opyd, MD  Outpatient Primary MD for the patient is Myrlene Brokerrawford, Elizabeth A, MD  LOS - 2   Chief Complaint  Patient presents with  . Recurrent UTI        Subjective:    Bonnie Laoorothy Nobles today has no fevers, no emesis,  No chest pain, intermittently confused, caregiver at bedside, questions answered  Assessment  & Plan :    Principal Problem:   Acute lower UTI Active Problems:   Dementia   Essential hypertension   Acute encephalopathy   Hx of completed stroke   UTI due to extended-spectrum beta lactamase (ESBL) producing Escherichia coli   Principal Problem:   Acute lower UTI Active Problems:   Dementia   Essential hypertension   Acute encephalopathy   Hx of completed stroke   UTI due to extended-spectrum beta lactamase (ESBL) producing Escherichia coli   Brief Narrative: Patient is a [age] year old female with past medical history of CVA, dementia, hypertension who presents to the emergency department for the evaluation of increased confusion.  Patient was evaluated at urgent care a few days ago for urinary retention.  Urine retention resolved but urine culture sent at the time was reported to grow ESBL bacteria.  Patient was recently admitted here for CVA and ESBL UTI (treated with 7 days course of Meropenem)and was discharged to skilled nursing facility .  Patient admitted on 04/13/2017 this time for management of possible ESBL UTI.     Assessment & Plan:     1)Possible Urinary tract infection: Initial urine culture done here on admission  appears to be contaminated, will try to recollect UA with catheterized specimen , she has H/O recurrent UTI.   N She was treated with Bactrim for UTI as an outpatient.  According to report urine culture collected  as an outpatient was found to have ESBL bacteria so she was sent to ED. This could also be colonization.    Continue meropenem started on 04/13/2017, given that outside urine culture grew ESBL consider treating for  5 to 7 days, no evidence of pyelonephritis or sepsis.  No fevers or leukocytosis.  Dr. Renford DillsAdhikari  previously discussed with on-call infectious disease attending, Dr. Orvan Falconerampbell.  As per the discussion, there is really not on an accurate way to determine if this is a true infection or colonization.  If her mental status continues to improve then it is reasonable to consider this as a new infection and continue meropenem for total of 5-7 days course.    2) advanced dementia: As per  caregiver, patient has significant cognitive deficits with sundowning.She gets confused on and off.  Patient has high risk for developing delirium/agitation. Try to minimize benzodiazepines.  If she continues to be confused/agitated, we can try on low-dose Zyprexa.  3)History of depression/anxiety: On Celexa, trazodone   4)History of CVA: No acute deficits.  Continue aspirin 325 mg daily and statin.  5)Renal insufficiency: Currently kidney function is on baseline.  Continue gentle IV fluids.  6)Hypertension: Continue metoprolol.  Blood pressure stable.   DVT prophylaxis:  Lovenox Code Status: DNR Family Communication: Daughter present  on the bedside Disposition Plan: Home after resolution of UTI and confusion   Consultants: None.  Phone consult with ID  Procedures: None  Antimicrobials: Meropenem since 04/13/17    DVT Prophylaxis  :  Lovenox    Lab Results  Component Value Date   PLT 213 04/14/2017    Inpatient Medications  Scheduled Meds: . aspirin  325 mg Oral Daily  . atorvastatin  20 mg Oral QHS  . citalopram  20 mg Oral Daily  . enoxaparin (LOVENOX) injection  40 mg Subcutaneous Q24H  . metoprolol succinate  50 mg Oral Q breakfast  . sodium chloride flush  3 mL Intravenous  Q12H  . traZODone  100 mg Oral QHS   Continuous Infusions: . sodium chloride    . sodium chloride 100 mL/hr at 04/14/17 1413  . meropenem (MERREM) IV Stopped (04/15/17 1150)   PRN Meds:.sodium chloride, acetaminophen **OR** acetaminophen, albuterol, ALPRAZolam, HYDROcodone-acetaminophen, ondansetron **OR** ondansetron (ZOFRAN) IV, polyethylene glycol, sodium chloride flush    Anti-infectives (From admission, onward)   Start     Dose/Rate Route Frequency Ordered Stop   04/14/17 1000  meropenem (MERREM) 1 g in sodium chloride 0.9 % 100 mL IVPB     1 g 200 mL/hr over 30 Minutes Intravenous Every 12 hours 04/13/17 2238     04/13/17 2200  meropenem (MERREM) 1 g in sodium chloride 0.9 % 100 mL IVPB     1 g 200 mL/hr over 30 Minutes Intravenous  Once 04/13/17 2149 04/13/17 2305        Objective:   Vitals:   04/14/17 0500 04/14/17 1300 04/14/17 1957 04/15/17 0600  BP: (!) 114/94 (!) 129/53 128/60 (!) 151/105  Pulse: 60 67 68 70  Resp: 17 16 16 18   Temp: 98 F (36.7 C) 98.1 F (36.7 C) 97.9 F (36.6 C) 98 F (36.7 C)  TempSrc: Oral Oral Oral Oral  SpO2: 92% 99% 98% 96%    Wt Readings from Last 3 Encounters:  04/10/17 70.8 kg (156 lb 0.2 oz)  03/06/17 69.2 kg (152 lb 8.9 oz)  11/03/16 68.9 kg (152 lb)     Intake/Output Summary (Last 24 hours) at 04/15/2017 1702 Last data filed at 04/15/2017 1120 Gross per 24 hour  Intake 10 ml  Output -  Net 10 ml     Physical Exam  Gen:- Awake Alert, cooperative HEENT:- [place].AT, No sclera icterus Neck-Supple Neck,No JVD,.  Lungs-  CTAB  CV- S1, S2 normal Abd-  +ve B.Sounds, Abd Soft, No tenderness,    Extremity/Skin:- No  edema,    Psych-affect is appropriate, disoriented from time to time  neuro-no new focal deficits, no tremors   Data Review:   Micro Results Recent Results (from the past 240 hour(s))  Urine culture     Status: Abnormal   Collection Time: 04/13/17  8:00 PM  Result Value Ref Range Status   Specimen  Description URINE, CLEAN CATCH  Final   Special Requests   Final    NONE Performed at [facility], [address], [place], [place] [address]    Culture MULTIPLE SPECIES PRESENT, SUGGEST RECOLLECTION (A)  Final   Report Status 04/15/2017 FINAL  Final  Culture, blood (routine x 2)     Status: None (Preliminary result)   Collection Time: 04/13/17 10:15 PM  Result Value Ref Range Status   Specimen Description BLOOD LEFT HAND  Final   Special Requests   Final    BOTTLES DRAWN AEROBIC AND ANAEROBIC Blood  Culture adequate volume   Culture   Final    NO GROWTH 2 DAYS Performed at [facility], [address] [place], [place] [address]    Report Status PENDING  Incomplete  Culture, blood (routine x 2)     Status: None (Preliminary result)   Collection Time: 04/13/17 10:18 PM  Result Value Ref Range Status   Specimen Description BLOOD RIGHT HAND  Final   Special Requests IN PEDIATRIC BOTTLE Blood Culture adequate volume  Final   Culture   Final    NO GROWTH 2 DAYS Performed at [facility], [address], [place], [place] [address]    Report Status PENDING  Incomplete    Radiology Reports No results found.   CBC Recent Labs  Lab 04/13/17 2003 04/14/17 0536  WBC 5.2 4.7  HGB 13.8 12.5  HCT 42.2 38.9  PLT 256 213  MCV 90.4 91.1  MCH 29.6 29.3  MCHC 32.7 32.1  RDW 14.2 14.5  LYMPHSABS 1.4 1.7  MONOABS 1.2* 0.9  EOSABS 0.1 0.0  BASOSABS 0.1 0.0    Chemistries  Recent Labs  Lab 04/13/17 2003 04/14/17 0536  NA 138 138  K 4.6 4.5  CL 101 104  CO2 25 23  GLUCOSE 136* 104*  BUN 17 16  CREATININE 1.20* 1.00  CALCIUM 9.8 8.9  AST 24  --   ALT 28  --   ALKPHOS 78  --   BILITOT 0.6  --    ------------------------------------------------------------------------------------------------------------------ No results for input(s): CHOL, HDL, LDLCALC, TRIG, CHOLHDL, LDLDIRECT in the last 72 hours.  Lab Results  Component Value Date   HGBA1C 6.3 (H)  03/08/2017   ------------------------------------------------------------------------------------------------------------------ No results for input(s): TSH, T4TOTAL, T3FREE, THYROIDAB in the last 72 hours.  Invalid input(s): FREET3 ------------------------------------------------------------------------------------------------------------------ No results for input(s): VITAMINB12, FOLATE, FERRITIN, TIBC, IRON, RETICCTPCT in the last 72 hours.  Coagulation profile No results for input(s): INR, PROTIME in the last 168 hours.  No results for input(s): DDIMER in the last 72 hours.  Cardiac Enzymes No results for input(s): CKMB, TROPONINI, MYOGLOBIN in the last 168 hours.  Invalid input(s): CK ------------------------------------------------------------------------------------------------------------------ No results found for: BNP   Shon Hale M.D on 04/15/2017 at 5:02 PM  Between 7am to 7pm - Pager - [phone]  After 7pm go to [email or web address] - password [email or web address]  [facility] -  Office  [phone]   Voice Recognition Reubin Milan dictation system was used to create this note, attempts have been made to correct errors. Please contact the author with questions and/or clarifications.

## 2017-04-16 NOTE — Progress Notes (Signed)
Patient Demographics:    Bonnie Mora, is a 84 y.o. female, DOB - 1926-02-21, [id number]  Admit date - 04/13/2017   Admitting Physician Briscoe Deutscherimothy S Opyd, MD  Outpatient Primary MD for the patient is Myrlene Brokerrawford, Elizabeth A, MD  LOS - 3   Chief Complaint  Patient presents with  . Recurrent UTI        Subjective:    Bonnie Laoorothy Azeez today has no fevers, no emesis,  No chest pain, less confused, caregiver Ms Cala BradfordKimberly at bedside, questions answered, no dysuria  Assessment  & Plan :    Principal Problem:   Acute lower UTI Active Problems:   Dementia   Essential hypertension   Acute encephalopathy   Hx of completed stroke   UTI due to extended-spectrum beta lactamase (ESBL) producing Escherichia coli   Principal Problem:   Acute lower UTI Active Problems:   Dementia   Essential hypertension   Acute encephalopathy   Hx of completed stroke   UTI due to extended-spectrum beta lactamase (ESBL) producing Escherichia coli   Brief Narrative: Patient is a 84 year old female with past medical history of CVA, dementia, hypertension who presents to the emergency department for the evaluation of increased confusion.  Patient was evaluated at urgent care a few days ago for urinary retention.  Urine retention resolved but urine culture sent at the time was reported to grow ESBL bacteria.  Patient was recently admitted here for CVA and ESBL UTI (treated with 7 days course of Meropenem)and was discharged to skilled nursing facility .  Patient admitted on 04/13/2017 this time for management of possible ESBL UTI.     Assessment & Plan:     1)Possible Urinary tract infection:    Less confused, no fevers, no dysuria, Initial urine culture  done here on 04/13/17  on admission  appears to be contaminated, Repeat  UA with catheterized specimen from 04/15/17 is pending , she has H/O recurrent UTI.   She was treated with  Bactrim for UTI as an outpatient.  According to report urine culture collected as an outpatient was found to have ESBL bacteria so she was sent to ED. This could also be colonization.    Continue meropenem started on 04/13/2017, given that outside urine culture grew ESBL consider treating for  5 to 7 days, no evidence of pyelonephritis or sepsis.  No fevers or leukocytosis.  Dr. Renford DillsAdhikari  previously discussed with on-call infectious disease attending, Dr. Orvan Falconerampbell.  As per the discussion, there is really not on an accurate way to determine if this is a true infection or colonization.  If her mental status continues to improve then it is reasonable to consider this as a new infection and continue meropenem for total of 5 days course.    2) advanced dementia: As per  caregiver, patient has significant cognitive deficits with sundowning.She gets confused on and off.  Patient has high risk for developing delirium/agitation. Try to minimize benzodiazepines.  If she continues to be confused/agitated, we can try on low-dose Zyprexa.  3)History of depression/anxiety: On Celexa, trazodone   4)History of CVA: No acute deficits.  Continue aspirin 325 mg daily and statin.  5)AKI----acute kidney injury   -     creatinine on admission= 1.20  ,  baseline creatinine = 0.8   , creatinine is now= 1.0     ,  Avoid nephrotoxic agents/dehydration/hypotension,  Continue gentle IV fluids.  6)Hypertension: Continue metoprolol.  Blood pressure stable.   DVT prophylaxis:  Lovenox Code Status: DNR Family Communication: Daughter present on the bedside Disposition Plan: Home after resolution of UTI and confusion   Consultants: None.  Phone consult with ID  Procedures: None  Antimicrobials: Meropenem since 04/13/17    DVT Prophylaxis  :  Lovenox    Lab Results  Component Value Date   PLT 213 04/14/2017    Inpatient Medications  Scheduled Meds: . aspirin  325 mg Oral Daily  . atorvastatin  20 mg Oral  QHS  . citalopram  20 mg Oral Daily  . enoxaparin (LOVENOX) injection  40 mg Subcutaneous Q24H  . metoprolol succinate  50 mg Oral Q breakfast  . sodium chloride flush  3 mL Intravenous Q12H  . traZODone  100 mg Oral QHS   Continuous Infusions: . sodium chloride    . sodium chloride 100 mL/hr at 04/14/17 1413  . meropenem (MERREM) IV 1 g (04/16/17 1048)   PRN Meds:.sodium chloride, acetaminophen **OR** acetaminophen, albuterol, ALPRAZolam, HYDROcodone-acetaminophen, ondansetron **OR** ondansetron (ZOFRAN) IV, polyethylene glycol, sodium chloride flush    Anti-infectives (From admission, onward)   Start     Dose/Rate Route Frequency Ordered Stop   04/14/17 1000  meropenem (MERREM) 1 g in sodium chloride 0.9 % 100 mL IVPB     1 g 200 mL/hr over 30 Minutes Intravenous Every 12 hours 04/13/17 2238     04/13/17 2200  meropenem (MERREM) 1 g in sodium chloride 0.9 % 100 mL IVPB     1 g 200 mL/hr over 30 Minutes Intravenous  Once 04/13/17 2149 04/13/17 2305        Objective:   Vitals:   04/15/17 1728 04/15/17 2059 04/16/17 0607 04/16/17 1504  BP: (!) 131/44 (!) 137/57 (!) 132/58 (!) 157/38  Pulse: 60 60 62 67  Resp:  16 16 17   Temp:  97.8 F (36.6 C) 97.9 F (36.6 C) 98.7 F (37.1 C)  TempSrc:  Oral Oral Oral  SpO2:  95% 93% 94%    Wt Readings from Last 3 Encounters:  04/10/17 70.8 kg (156 lb 0.2 oz)  03/06/17 69.2 kg (152 lb 8.9 oz)  11/03/16 68.9 kg (152 lb)     Intake/Output Summary (Last 24 hours) at 04/16/2017 1527 Last data filed at 04/16/2017 1500 Gross per 24 hour  Intake 110 ml  Output 450 ml  Net -340 ml     Physical Exam  Gen:- Awake Alert, cooperative HEENT:- [place].AT, No sclera icterus Neck-Supple Neck,No JVD,.  Lungs-  CTAB  CV- S1, S2 normal Abd-  +ve B.Sounds, Abd Soft, No tenderness,    Extremity/Skin:- No  edema,    Psych-affect is appropriate, less confused from time to time  neuro-no new focal deficits, no tremors   Data Review:   Micro  Results Recent Results (from the past 240 hour(s))  Urine culture     Status: Abnormal   Collection Time: 04/13/17  8:00 PM  Result Value Ref Range Status   Specimen Description URINE, CLEAN CATCH  Final   Special Requests   Final    NONE Performed at [facility], [address] [place], [place] [address]    Culture MULTIPLE SPECIES PRESENT, SUGGEST RECOLLECTION (A)  Final   Report Status 04/15/2017 FINAL  Final  Culture, blood (routine x  2)     Status: None (Preliminary result)   Collection Time: 04/13/17 10:15 PM  Result Value Ref Range Status   Specimen Description BLOOD LEFT HAND  Final   Special Requests   Final    BOTTLES DRAWN AEROBIC AND ANAEROBIC Blood Culture adequate volume   Culture   Final    NO GROWTH 3 DAYS Performed at [facility], [address] [place], [place] [address]    Report Status PENDING  Incomplete  Culture, blood (routine x 2)     Status: None (Preliminary result)   Collection Time: 04/13/17 10:18 PM  Result Value Ref Range Status   Specimen Description BLOOD RIGHT HAND  Final   Special Requests IN PEDIATRIC BOTTLE Blood Culture adequate volume  Final   Culture   Final    NO GROWTH 3 DAYS Performed at [facility], [address] [place], [place] [address]    Report Status PENDING  Incomplete    Radiology Reports No results found.   CBC Recent Labs  Lab 04/13/17 2003 04/14/17 0536  WBC 5.2 4.7  HGB 13.8 12.5  HCT 42.2 38.9  PLT 256 213  MCV 90.4 91.1  MCH 29.6 29.3  MCHC 32.7 32.1  RDW 14.2 14.5  LYMPHSABS 1.4 1.7  MONOABS 1.2* 0.9  EOSABS 0.1 0.0  BASOSABS 0.1 0.0    Chemistries  Recent Labs  Lab 04/13/17 2003 04/14/17 0536  NA 138 138  K 4.6 4.5  CL 101 104  CO2 25 23  GLUCOSE 136* 104*  BUN 17 16  CREATININE 1.20* 1.00  CALCIUM 9.8 8.9  AST 24  --   ALT 28  --   ALKPHOS 78  --   BILITOT 0.6  --     ------------------------------------------------------------------------------------------------------------------ No results for input(s): CHOL, HDL, LDLCALC, TRIG, CHOLHDL, LDLDIRECT in the last 72 hours.  Lab Results  Component Value Date   HGBA1C 6.3 (H) 03/08/2017   ------------------------------------------------------------------------------------------------------------------ No results for input(s): TSH, T4TOTAL, T3FREE, THYROIDAB in the last 72 hours.  Invalid input(s): FREET3 ------------------------------------------------------------------------------------------------------------------ No results for input(s): VITAMINB12, FOLATE, FERRITIN, TIBC, IRON, RETICCTPCT in the last 72 hours.  Coagulation profile No results for input(s): INR, PROTIME in the last 168 hours.  No results for input(s): DDIMER in the last 72 hours.  Cardiac Enzymes No results for input(s): CKMB, TROPONINI, MYOGLOBIN in the last 168 hours.  Invalid input(s): CK ------------------------------------------------------------------------------------------------------------------ No results found for: BNP   Shon Hale M.D on 04/16/2017 at 3:27 PM  Between 7am to 7pm - Pager - [phone]  After 7pm go to [email or web address] - password [email or web address]  [facility] -  Office  [phone]   Voice Recognition Reubin Milan dictation system was used to create this note, attempts have been made to correct errors. Please contact the author with questions and/or clarifications.

## 2017-04-16 NOTE — Progress Notes (Signed)
Pharmacy Antibiotic Note  Bonnie NorthDorothy J Mora is a 84 y.o. female admitted on 04/13/2017 with confusion and disorientation from recurrent UTI.  She has a history of ESBL E.coli UTI.  Pharmacy has been consulted for Merrem dosing.    D#4 for UTI, hx ESBL E.coli with disorientation/confusion on admit. Has hx of dementia, questionable colonization. Afebrile, WBC wnl. UA dirty with epithelial cells. Treated with Bactrim as outpatient but E. Coli was resistant back in Feb.  Plan: Continue Merrem 1gm IV Q12H  Monitor clinical picture, renal function F/U C&S, LOT  Can we treat empirically for 5 days and stop?    Temp (24hrs), Avg:97.7 F (36.5 C), Min:97.4 F (36.3 C), Max:97.9 F (36.6 C)  Recent Labs  Lab 04/13/17 2003 04/13/17 2100 04/14/17 0536 04/14/17 1138  WBC 5.2  --  4.7  --   CREATININE 1.20*  --  1.00  --   LATICACIDVEN  --  2.02*  --  1.0    Estimated Creatinine Clearance: 34.1 mL/min (by C-G formula based on SCr of 1 mg/dL).    Allergies  Allergen Reactions  . Codeine Nausea And Vomiting    Merrem 3/25 >> Bactrim PTA  3/25 BCx - ngtd 3/25 UCx - multiple species  Enzo BiNathan Likisha Alles, PharmD, [facility] HospitalBCPS Clinical Pharmacist Pager [phone] 04/16/2017 12:41 PM

## 2017-04-17 ENCOUNTER — Other Ambulatory Visit: Payer: Self-pay | Admitting: Internal Medicine

## 2017-04-17 LAB — URINE CULTURE: CULTURE: NO GROWTH

## 2017-04-17 MED ORDER — ATORVASTATIN CALCIUM 20 MG PO TABS: 20.0000 mg | ORAL_TABLET | Freq: Every day | ORAL | 2 refills | Status: DC

## 2017-04-17 MED ORDER — METOPROLOL SUCCINATE ER 50 MG PO TB24: 50.0000 mg | ORAL_TABLET | Freq: Every day | ORAL | 3 refills | Status: AC

## 2017-04-17 MED ORDER — ALPRAZOLAM 0.5 MG PO TABS: 0.5000 mg | ORAL_TABLET | Freq: Two times a day (BID) | ORAL | 0 refills | Status: DC | PRN

## 2017-04-17 MED ORDER — ACETAMINOPHEN 325 MG PO TABS: 650.0000 mg | ORAL_TABLET | Freq: Four times a day (QID) | ORAL | 0 refills | Status: DC | PRN

## 2017-04-17 MED ORDER — ALPRAZOLAM 0.5 MG PO TABS: 0.5000 mg | ORAL_TABLET | Freq: Two times a day (BID) | ORAL | 0 refills | Status: AC | PRN

## 2017-04-17 MED ORDER — CITALOPRAM HYDROBROMIDE 20 MG PO TABS: 20.0000 mg | ORAL_TABLET | Freq: Every day | ORAL | 3 refills | Status: AC

## 2017-04-17 NOTE — Discharge Summary (Signed)
Bonnie NorthDorothy J Mora, is a 84 y.o. female  DOB [birth year]  MRN [id number].  Admission date:  04/13/2017  Admitting Physician  Briscoe Deutscherimothy S Opyd, MD  Discharge Date:  04/17/2017   Primary MD  Myrlene Brokerrawford, Elizabeth A, MD  Recommendations for primary care physician for things to follow:   Admission Diagnosis  Confusion [R41.0] Weakness [R53.1] Complicated UTI (urinary tract infection) [N39.0] Infection due to ESBL-producing Escherichia coli [A49.8, Z16.12]   Discharge Diagnosis  Confusion [R41.0] Weakness [R53.1] Complicated UTI (urinary tract infection) [N39.0] Infection due to ESBL-producing Escherichia coli [A49.8, Z16.12]    Principal Problem:   Acute lower UTI Active Problems:   Dementia   Essential hypertension   Acute encephalopathy   Hx of completed stroke   UTI due to extended-spectrum beta lactamase (ESBL) producing Escherichia coli      Past Medical History:  Diagnosis Date  . Dementia   . Hyperlipidemia   . Hypertension   . Parkinson disease [facility](HCC)     Past Surgical History:  Procedure Laterality Date  . APPENDECTOMY    . JOINT REPLACEMENT     bilateral knee  . OVARIAN CYST SURGERY    . TONSILLECTOMY AND ADENOIDECTOMY         HPI  from the history and physical done on the day of admission:     Patient coming from: Home   Chief Complaint: Increased confusion, outpatient urine culture with ESBL   HPI: Bonnie Mora is a 84 y.o. female with medical history significant for history of CVA, dementia, anxiety, and hypertension, now presenting to the emergency department for evaluation of increased confusion and UTI with outpatient culture growing ESBL.  Patient was admitted to the hospital last month with ESBL UTI, improved, but developed a recurrence in her symptoms and was recently started on Bactrim.  Despite this, her confusion has continued to worsen and she has been complaining of  low back pain.  Caregiver was notified by the patient's PCP today that the urine culture was again growing ESBL, and it was recommended that she go to the hospital for treatment.  No fevers have been documented and aside from low back pain, the patient has not had any complaints.  ED Course: Upon arrival to the ED, patient is found to be afebrile, saturating well on room air, and with vitals otherwise normal.  Chemistry panel is notable for a creatinine of 1.20, up from 0.9 last month.  CBC is unremarkable.  Lactic acid is slightly elevated. Urinalysis is nitrite positive.  Blood and urine cultures were collected in the emergency department, 1 L of normal saline was administered, and the patient was started on meropenem.  She remains hemodynamically stable, in no apparent respiratory distress, and will be admitted to the medical-surgical unit for ongoing evaluation and management of recurrent ESBL UTI.      Hospital Course:    Brief Narrative: Patient is a 84 year old female with past medical history of CVA, dementia, hypertension who presents to the emergency department for the evaluation of  increased confusion.Patient was evaluated at urgent care a few days ago for urinary retention.Urine retention resolved but urine culture sent at the time was reported to grow ESBL bacteria.Patient was recently admitted here for CVA and ESBL UTI (treated with 7 days course of Meropenem)and was discharged to skilled nursing facility.Patient admitted on 04/13/2017 this time for management of possible ESBL UTI.     Assessment & Plan:   1)Possible Urinary tract infection:  Much  Less confused, no fevers, no dysuria, Initial urine culture  done here on 04/13/17  on admission  appears to be contaminated, Repeat  UA with catheterized specimen from 04/15/17 is w/o growth , she has H/Orecurrent UTI.  She wastreated with Bactrim for UTI as an outpatient.  According to report urine culture collected as an  outpatient was found to have ESBL bacteriaso she was sent to ED. This could also be colonization.  she was treated with iv meropenem started on 04/13/2017 thru 04/17/17 given that outside urine culture grew ESBL .  no evidence of pyelonephritis or sepsis.  No fevers or leukocytosis.  Dr. Renford Dills  previously discussed with on-call infectious disease attending,Dr. Orvan Falconer.As per the discussion, there is really not on an accurate way to determine if this is a trueinfection or colonization.   2) advanced dementia:As per  caregiver, patient has significant cognitive deficits with sundowning.She gets confused on and off.Overall much improved at this time  3)History of depression/anxiety: On Celexa, trazodone   4)History of CVA: No acute deficits. Continue aspirin 325 mg daily and statin.  5)AKI----acute kidney injury   -     creatinine on admission= 1.20  ,   baseline creatinine = 0.8   , creatinine is now= 1.0     ,  Avoid nephrotoxic agents/dehydration/hypotension,   6)Hypertension: Continue metoprolol. Blood pressure stable.   DVT prophylaxis:Lovenox Code Status:DNR Family Communication:Daughter present on the bedside Disposition Plan:Home with caregiver Consultants:None.Phone consult with ID  Discharge Condition: stable  Diet and Activity recommendation:  As advised  Discharge Instructions     Discharge Instructions    Call MD for:  difficulty breathing, headache or visual disturbances   Complete by:  As directed    Call MD for:  persistant dizziness or light-headedness   Complete by:  As directed    Call MD for:  persistant nausea and vomiting   Complete by:  As directed    Call MD for:  severe uncontrolled pain   Complete by:  As directed    Call MD for:  temperature >100.4   Complete by:  As directed    Diet - low sodium heart healthy   Complete by:  As directed    Discharge instructions   Complete by:  As directed    Drink Plenty  fluids/maintain adequate hydration Follow-up with primary care physician in 1-2 weeks for recheck   Increase activity slowly   Complete by:  As directed         Discharge Medications     Allergies as of 04/17/2017      Reactions   Codeine Nausea And Vomiting      Medication List    STOP taking these medications   mupirocin ointment 2 % Commonly known as:  BACTROBAN   sulfamethoxazole-trimethoprim 800-160 MG tablet Commonly known as:  BACTRIM DS,SEPTRA DS     TAKE these medications   acetaminophen 325 MG tablet Commonly known as:  TYLENOL Take 2 tablets (650 mg total) by mouth every 6 (six) hours as needed  for mild pain (or Fever >/= 101).   albuterol 108 (90 Base) MCG/ACT inhaler Commonly known as:  PROVENTIL HFA;VENTOLIN HFA Inhale 2 puffs into the lungs every 6 (six) hours as needed for wheezing or shortness of breath.   ALPRAZolam 0.5 MG tablet Commonly known as:  XANAX Take 1 tablet (0.5 mg total) by mouth 2 (two) times daily as needed for anxiety.   aspirin 325 MG tablet Take 1 tablet (325 mg total) by mouth daily.   atorvastatin 20 MG tablet Commonly known as:  LIPITOR Take 1 tablet (20 mg total) by mouth at bedtime.   citalopram 20 MG tablet Commonly known as:  CELEXA Take 1 tablet (20 mg total) by mouth daily.   metoprolol succinate 50 MG 24 hr tablet Commonly known as:  TOPROL-XL Take 1 tablet (50 mg total) by mouth daily with breakfast.   polyethylene glycol packet Commonly known as:  MIRALAX / GLYCOLAX Take 17 g by mouth daily as needed for mild constipation.   traZODone 100 MG tablet Commonly known as:  DESYREL Take 1 tablet (100 mg total) by mouth at bedtime.       Major procedures and Radiology Reports - PLEASE review detailed and final reports for all details, in brief -   No results found.  Micro Results   Recent Results (from the past 240 hour(s))  Urine culture     Status: Abnormal   Collection Time: 04/13/17  8:00 PM  Result  Value Ref Range Status   Specimen Description URINE, CLEAN CATCH  Final   Special Requests   Final    NONE Performed at [facility], [address], [place], [place] [address]    Culture MULTIPLE SPECIES PRESENT, SUGGEST RECOLLECTION (A)  Final   Report Status 04/15/2017 FINAL  Final  Culture, blood (routine x 2)     Status: None (Preliminary result)   Collection Time: 04/13/17 10:15 PM  Result Value Ref Range Status   Specimen Description BLOOD LEFT HAND  Final   Special Requests   Final    BOTTLES DRAWN AEROBIC AND ANAEROBIC Blood Culture adequate volume   Culture   Final    NO GROWTH 4 DAYS Performed at [facility], [address], [place], [place] [address]    Report Status PENDING  Incomplete  Culture, blood (routine x 2)     Status: None (Preliminary result)   Collection Time: 04/13/17 10:18 PM  Result Value Ref Range Status   Specimen Description BLOOD RIGHT HAND  Final   Special Requests IN PEDIATRIC BOTTLE Blood Culture adequate volume  Final   Culture   Final    NO GROWTH 4 DAYS Performed at [facility], [address], [place], [place] [address]    Report Status PENDING  Incomplete  Culture, Urine     Status: None   Collection Time: 04/16/17 10:35 AM  Result Value Ref Range Status   Specimen Description URINE, CATHETERIZED  Final   Special Requests NONE  Final   Culture   Final    NO GROWTH Performed at [facility], [address], [place], [place] [address]    Report Status 04/17/2017 FINAL  Final       Today   Subjective    Marieann Zipp today has no new complaints, No fever  Or chills , eating and drinking well, nausea, vomiting, diarrhea           Patient has been seen and  examined prior to discharge   Objective   Blood pressure (!) 159/42, pulse 62, temperature 98 F (36.7 C), temperature source Oral, resp. rate 17, SpO2 93 %.   Intake/Output Summary (Last 24 hours) at 04/17/2017 1759 Last data filed at 04/17/2017  1300 Gross per 24 hour  Intake 480 ml  Output -  Net 480 ml    Exam Gen:- Awake Alert, cooperative HEENT:- [place].AT, No sclera icterus Neck-Supple Neck,No JVD,.  Lungs-  CTAB  CV- S1, S2 normal Abd-  +ve B.Sounds, Abd Soft, No tenderness, no CVA tenderness   Extremity/Skin:- No  edema,    Psych-affect is appropriate, much less confused  neuro-no new focal deficits, no tremors     Data Review   CBC w Diff:  Lab Results  Component Value Date   WBC 4.7 04/14/2017   HGB 12.5 04/14/2017   HCT 38.9 04/14/2017   PLT 213 04/14/2017   LYMPHOPCT 37 04/14/2017   MONOPCT 19 04/14/2017   EOSPCT 1 04/14/2017   BASOPCT 1 04/14/2017    CMP:  Lab Results  Component Value Date   NA 138 04/14/2017   NA 141 03/06/2017   K 4.5 04/14/2017   CL 104 04/14/2017   CO2 23 04/14/2017   BUN 16 04/14/2017   BUN 19 03/06/2017   CREATININE 1.00 04/14/2017   GLU 169 03/06/2017   PROT 7.4 04/13/2017   ALBUMIN 3.7 04/13/2017   BILITOT 0.6 04/13/2017   ALKPHOS 78 04/13/2017   AST 24 04/13/2017   ALT 28 04/13/2017  .   Total Discharge time is about 33 minutes  Shon Hale M.D on 04/17/2017 at 5:59 PM  [facility]   Office  [phone]  Voice Recognition Reubin Milan dictation system was used to create this note, attempts have been made to correct errors. Please contact the author with questions and/or clarifications.

## 2017-04-18 LAB — CULTURE, BLOOD (ROUTINE X 2)
Culture: NO GROWTH
Culture: NO GROWTH
Special Requests: ADEQUATE
Special Requests: ADEQUATE

## 2017-05-04 ENCOUNTER — Encounter: Payer: Self-pay | Admitting: Internal Medicine

## 2017-05-04 ENCOUNTER — Ambulatory Visit: Payer: Medicare Other | Admitting: Internal Medicine

## 2017-05-04 ENCOUNTER — Other Ambulatory Visit: Payer: Medicare Other

## 2017-05-04 VITALS — BP 144/90 | HR 119 | Ht 60.0 in | Wt 154.0 lb

## 2017-05-04 DIAGNOSIS — N390 Urinary tract infection, site not specified: Secondary | ICD-10-CM

## 2017-05-04 DIAGNOSIS — R300 Dysuria: Secondary | ICD-10-CM | POA: Diagnosis not present

## 2017-05-04 DIAGNOSIS — F0391 Unspecified dementia with behavioral disturbance: Secondary | ICD-10-CM

## 2017-05-04 LAB — POCT URINALYSIS DIPSTICK
Bilirubin, UA: NEGATIVE
GLUCOSE UA: NEGATIVE
Ketones, UA: 1.03
NITRITE UA: NEGATIVE
PROTEIN UA: 15
RBC UA: 2
Spec Grav, UA: >=1.03 — AB (ref 1.010–1.025)
Urobilinogen, UA: 0.2 E.U./dL
pH, UA: 6 (ref 5.0–8.0)

## 2017-05-04 MED ORDER — FOSFOMYCIN TROMETHAMINE 3 G PO PACK: 3.0000 g | PACK | Freq: Once | ORAL | 0 refills | Status: AC

## 2017-05-04 NOTE — Assessment & Plan Note (Signed)
Rx for fosfomycin as not resistant to gentamycin at last culture in February. There have been some concerns about colonization but given the mental status changes reported by caregiver we will treat as infection. Culture sent but small amount so given supplies for caregiver to collect at home and refrigerate at home and bring in tomorrow so we can check culture given ESBL and multidrug resistant in the past. If still resistant may be able to arrange outpatient IV therapy or back to hospital for treatment.

## 2017-05-04 NOTE — Addendum Note (Signed)
Addended by: Berton LanGULCH, BRIANA R on: 05/04/2017 05:06 PM   Modules accepted: Orders

## 2017-05-04 NOTE — Patient Instructions (Signed)
We have sent in the fosfomycin to take. You can put it in applesauce to give her. We will get a culture and then check if the antibiotic will work.  It is okay to use 2 xanax at bedtime to help her sleep. Do not give trazodone as well.   We will have hospice come out to help out with you.

## 2017-05-04 NOTE — Assessment & Plan Note (Signed)
Symptoms are worsening in the last several months and with several episodes of recurrent agitation and encephalopathy with recurrent UTI. The UTIs may not be treatable given, multidrug resistance will refer to hospice. The family was not at the visit and it is unknown how much treatment they are willing for the patient to undergo. Can increase xanax to 2 with bedtime (skip trazodone) to help with sleep during uti.

## 2017-05-04 NOTE — Progress Notes (Signed)
   Subjective:    Patient ID: Bonnie Mora, female    DOB: 1927/01/20, 84 y.o.   MRN: [id number]  HPI The patient is an 84 YO female coming in for confusion. She is with caregiver. Started about 36 hours ago and she has not slept since that time. She has had a lot of utis recently which are very resistant to antibiotics. Did 5 day course of meropenem in the hospital about 2-3 weeks ago. Symptoms started with not being able to urinate and some confusion. She does have advanced dementia but was less oriented. She has been using xanax to help calm her down. She does repeatedly try to get out of bed and is at high risk to injure herself.   Review of Systems  Unable to perform ROS: Dementia      Objective:   Physical Exam  Constitutional: She appears well-developed. She appears distressed.  HENT:  Head: Normocephalic and atraumatic.  Eyes: EOM are normal.  Cardiovascular: Normal rate and regular rhythm.  Pulmonary/Chest: Effort normal. No respiratory distress. She has no wheezes. She has no rales.  Abdominal: Soft. Bowel sounds are normal. She exhibits no distension. There is no tenderness. There is no rebound.  Musculoskeletal: She exhibits no edema.  Neurological: She is alert. Coordination abnormal.  Not oriented and not able to follow directions.   Skin: Skin is warm and dry.   Vitals:   05/04/17 1608  BP: (!) 144/90  Pulse: (!) 119  SpO2: 96%  Weight: 154 lb (69.9 kg)  Height: 5' (1.524 m)      Assessment & Plan:

## 2017-05-06 ENCOUNTER — Telehealth: Payer: Self-pay

## 2017-05-06 NOTE — Telephone Encounter (Signed)
Dr. Delanna NoticeHertwick with pt is eligible for palliative care but not for hospice due pt is very functioning at this time. Gave verbal okay for palliative assessment and recommendations.

## 2017-05-07 NOTE — Telephone Encounter (Signed)
Fine

## 2017-05-12 ENCOUNTER — Telehealth: Payer: Self-pay

## 2017-05-12 NOTE — Telephone Encounter (Signed)
Spoke with caregiver, Visit scheduled for Tuesday 05/19/17.

## 2017-05-19 ENCOUNTER — Other Ambulatory Visit: Payer: Self-pay | Admitting: Hospice and Palliative Medicine

## 2017-05-19 ENCOUNTER — Other Ambulatory Visit: Payer: Medicare Other | Admitting: Internal Medicine

## 2017-05-19 DIAGNOSIS — Z515 Encounter for palliative care: Secondary | ICD-10-CM

## 2017-05-19 NOTE — Progress Notes (Signed)
PALLIATIVE CARE CONSULT VISIT   PATIENT NAME: Bonnie Mora DOB: 1926/05/06 MRN: [id number]  PRIMARY CARE PROVIDER: [name], MD  RESPONSIBLE PARTY:   Son   RECOMMENDATIONS and PLAN:  1. Weakness - she lives at home with a 24/ caregiver. She has a son and daughter who also live nearby. At baseline, patient can stand on her own and ambulates with assistance of caregivers. She is dependent upon others for assistance with ADLs but can mostly toilet and feed herself. She has no recent falls or injuries. Oral intake is reportedly quite good. FAST 6C 2. Recurrent UTIs - patient has several hospitalizations in the past year for same. Caregivers have been allowing patient to toilet herself. I recommended that they assist with this going forward as it could be contributing to the development of UTIs. Might also consider referral to urology if needed.  3. Pain - stable.  4. Anxiety - stable on alprazolam and Celexa 5. GOC - family would be interested in hospice in the future if appropriate. However, they would like to continue treatment of UTIs and would hospitalize her if necessary for treatment. Patient is a DNR.   I spent 45 minutes providing this consultation,  from 1100 to 1145. More than 50% of the time in this consultation was spent coordinating communication.   HISTORY OF PRESENT ILLNESS:  Bonnie Mora is a 84 y.o. year old female with multiple medical problems including Alzheimer's dementia, recurrent UTIs, HLD, HTN, anxiety, and chronic L. Shoulder pain requiring steroid injections. Patient was referred to hospice but was felt not eligible due to her performance status. Palliative Care was therefore asked to follow in the home.   CODE STATUS: DNR  PPS: 30% HOSPICE ELIGIBILITY/DIAGNOSIS: TBD  PAST MEDICAL HISTORY:  Past Medical History:  Diagnosis Date  . Dementia   . Hyperlipidemia   . Hypertension   . Parkinson disease (HCC)     SOCIAL HX:  Social History    Tobacco Use  . Smoking status: Never Smoker  . Smokeless tobacco: Never Used  Substance Use Topics  . Alcohol use: No    Alcohol/week: 0.0 oz    ALLERGIES:  Allergies  Allergen Reactions  . Codeine Nausea And Vomiting     PERTINENT MEDICATIONS:  Outpatient Encounter Medications as of 05/19/2017  Medication Sig  . acetaminophen (TYLENOL) 325 MG tablet Take 2 tablets (650 mg total) by mouth every 6 (six) hours as needed for mild pain (or Fever >/= 101).  Marland Kitchen albuterol (PROVENTIL HFA;VENTOLIN HFA) 108 (90 Base) MCG/ACT inhaler Inhale 2 puffs into the lungs every 6 (six) hours as needed for wheezing or shortness of breath.  . ALPRAZolam (XANAX) 0.5 MG tablet Take 1 tablet (0.5 mg total) by mouth 2 (two) times daily as needed for anxiety.  Marland Kitchen aspirin 325 MG tablet Take 1 tablet (325 mg total) by mouth daily.  Marland Kitchen atorvastatin (LIPITOR) 20 MG tablet Take 1 tablet (20 mg total) by mouth at bedtime.  . citalopram (CELEXA) 20 MG tablet Take 1 tablet (20 mg total) by mouth daily.  . metoprolol succinate (TOPROL-XL) 50 MG 24 hr tablet Take 1 tablet (50 mg total) by mouth daily with breakfast.  . polyethylene glycol (MIRALAX / GLYCOLAX) packet Take 17 g by mouth daily as needed for mild constipation. (Patient not taking: Reported on 05/04/2017)  . traZODone (DESYREL) 100 MG tablet Take 1 tablet (100 mg total) by mouth at bedtime.   No facility-administered encounter medications on file  as of 05/19/2017.     PHYSICAL EXAM:   General: NAD, frail appearing, thin Cardiovascular: regular rate and rhythm Pulmonary: clear ant fields Abdomen: soft, nontender, + bowel sounds GU: no suprapubic tenderness Extremities: no edema, no joint deformities Skin: no rashes Neurological: Weakness, answers simple questions, confused  Malachy Moan, NP

## 2017-06-16 ENCOUNTER — Telehealth: Payer: Self-pay | Admitting: Internal Medicine

## 2017-06-16 MED ORDER — FOSFOMYCIN TROMETHAMINE 3 G PO PACK: 3.0000 g | PACK | Freq: Once | ORAL | 0 refills | Status: AC

## 2017-06-16 NOTE — Telephone Encounter (Signed)
Can we get culture results from that facility to make an accurate decision?

## 2017-06-16 NOTE — Telephone Encounter (Signed)
Called [facility] they are going to fax results to [place]

## 2017-06-16 NOTE — Telephone Encounter (Signed)
Caregiver informed.

## 2017-06-16 NOTE — Telephone Encounter (Signed)
Where was she diagnosed with UTI and who is she saying called her and asked her to stop taking it? She has not been to any facilities that I have access to see.

## 2017-06-16 NOTE — Telephone Encounter (Signed)
Please contact caregiver when available.

## 2017-06-16 NOTE — Telephone Encounter (Signed)
Sent in fosfomycin which the bacteria should respond to. Take the packet with liquid and drink to clear infection.

## 2017-06-16 NOTE — Telephone Encounter (Signed)
Caregiver spoke with [facility] on 5/25.  States that patient was dx with UTI on Tuesday.  States that patient was prescribed macrobid and was informed to stop taking that medication.  States did not think any other medication was called into patients pharmacy.  States that patient is requesting fosfomycin.  States that patient was more confused and had a strong urine smell.  Also reported side flank or lower back pain.  Caller was advised to see physician within 24 hours.

## 2017-06-16 NOTE — Telephone Encounter (Signed)
[facility] in [place] patient was diagnosed with UTI. Patient did finish Macrobid. ABX was given before culture came back and when culture came back Macrobid was one that wouldn't work but they didn't send anything in and was told to contact PCP office. Patient states the fosfomycin was not on the list so was wondering if that could be sent in. When caregiver asked the urgent care if that could be sent in they stated that they never heard of it.

## 2017-06-17 ENCOUNTER — Encounter: Payer: Self-pay | Admitting: Internal Medicine

## 2017-06-17 LAB — URINE CULTURE
Escherichia coli: ABNORMAL
Urine Culture Result: ABNORMAL

## 2017-06-17 NOTE — Progress Notes (Signed)
Abstracted and sent to scan  

## 2017-07-17 ENCOUNTER — Other Ambulatory Visit: Payer: Self-pay | Admitting: Internal Medicine

## 2017-10-15 ENCOUNTER — Emergency Department (HOSPITAL_COMMUNITY): Payer: Medicare Other

## 2017-10-15 ENCOUNTER — Emergency Department (HOSPITAL_COMMUNITY)
Admission: EM | Admit: 2017-10-15 | Discharge: 2017-10-15 | Disposition: A | Discharge status: Home / Self Care | Payer: Medicare Other | Attending: Emergency Medicine | Admitting: Emergency Medicine

## 2017-10-15 ENCOUNTER — Encounter (HOSPITAL_COMMUNITY): Payer: Self-pay

## 2017-10-15 ENCOUNTER — Other Ambulatory Visit: Payer: Self-pay

## 2017-10-15 DIAGNOSIS — G20 Parkinson's disease: Secondary | ICD-10-CM | POA: Diagnosis not present

## 2017-10-15 DIAGNOSIS — F0390 Unspecified dementia without behavioral disturbance: Secondary | ICD-10-CM | POA: Insufficient documentation

## 2017-10-15 DIAGNOSIS — W06XXXA Fall from bed, initial encounter: Secondary | ICD-10-CM | POA: Insufficient documentation

## 2017-10-15 DIAGNOSIS — Z79899 Other long term (current) drug therapy: Secondary | ICD-10-CM | POA: Insufficient documentation

## 2017-10-15 DIAGNOSIS — B379 Candidiasis, unspecified: Secondary | ICD-10-CM | POA: Insufficient documentation

## 2017-10-15 DIAGNOSIS — W19XXXA Unspecified fall, initial encounter: Secondary | ICD-10-CM

## 2017-10-15 DIAGNOSIS — I10 Essential (primary) hypertension: Secondary | ICD-10-CM | POA: Diagnosis not present

## 2017-10-15 DIAGNOSIS — Z7982 Long term (current) use of aspirin: Secondary | ICD-10-CM | POA: Diagnosis not present

## 2017-10-15 DIAGNOSIS — R0789 Other chest pain: Secondary | ICD-10-CM | POA: Diagnosis present

## 2017-10-15 DIAGNOSIS — Z66 Do not resuscitate: Secondary | ICD-10-CM | POA: Diagnosis not present

## 2017-10-15 DIAGNOSIS — Z96653 Presence of artificial knee joint, bilateral: Secondary | ICD-10-CM | POA: Diagnosis not present

## 2017-10-15 DIAGNOSIS — B372 Candidiasis of skin and nail: Secondary | ICD-10-CM

## 2017-10-15 LAB — URINALYSIS, ROUTINE W REFLEX MICROSCOPIC
BACTERIA UA: NONE SEEN
BILIRUBIN URINE: NEGATIVE
GLUCOSE, UA: NEGATIVE mg/dL
Ketones, ur: NEGATIVE mg/dL
NITRITE: NEGATIVE
PROTEIN: NEGATIVE mg/dL
Specific Gravity, Urine: 1.024 (ref 1.005–1.030)
pH: 5 (ref 5.0–8.0)

## 2017-10-15 LAB — CBC WITH DIFFERENTIAL/PLATELET
ABS IMMATURE GRANULOCYTES: 0 10*3/uL (ref 0.0–0.1)
BASOS PCT: 1 %
Basophils Absolute: 0.1 10*3/uL (ref 0.0–0.1)
EOS ABS: 0.1 10*3/uL (ref 0.0–0.7)
Eosinophils Relative: 1 %
HCT: 43.4 % (ref 36.0–46.0)
Hemoglobin: 13.3 g/dL (ref 12.0–15.0)
Immature Granulocytes: 0 %
Lymphocytes Relative: 26 %
Lymphs Abs: 2.1 10*3/uL (ref 0.7–4.0)
MCH: 29.6 pg (ref 26.0–34.0)
MCHC: 30.6 g/dL (ref 30.0–36.0)
MCV: 96.4 fL (ref 78.0–100.0)
MONO ABS: 1.1 10*3/uL — AB (ref 0.1–1.0)
MONOS PCT: 15 %
Neutro Abs: 4.5 10*3/uL (ref 1.7–7.7)
Neutrophils Relative %: 57 %
PLATELETS: 225 10*3/uL (ref 150–400)
RBC: 4.5 MIL/uL (ref 3.87–5.11)
RDW: 13.8 % (ref 11.5–15.5)
WBC: 7.8 10*3/uL (ref 4.0–10.5)

## 2017-10-15 LAB — COMPREHENSIVE METABOLIC PANEL
ALT: 17 U/L (ref 0–44)
AST: 22 U/L (ref 15–41)
Albumin: 3.6 g/dL (ref 3.5–5.0)
Alkaline Phosphatase: 60 U/L (ref 38–126)
Anion gap: 8 (ref 5–15)
BUN: 27 mg/dL — ABNORMAL HIGH (ref 8–23)
CHLORIDE: 106 mmol/L (ref 98–111)
CO2: 26 mmol/L (ref 22–32)
CREATININE: 0.89 mg/dL (ref 0.44–1.00)
Calcium: 9.4 mg/dL (ref 8.9–10.3)
GFR calc Af Amer: >60 mL/min (ref >60)
GFR, EST NON AFRICAN AMERICAN: 56 mL/min — AB (ref >60)
Glucose, Bld: 116 mg/dL — ABNORMAL HIGH (ref 70–99)
Potassium: 4.2 mmol/L (ref 3.5–5.1)
Sodium: 140 mmol/L (ref 135–145)
TOTAL PROTEIN: 6.8 g/dL (ref 6.5–8.1)
Total Bilirubin: 0.5 mg/dL (ref 0.3–1.2)

## 2017-10-15 MED ORDER — NYSTATIN 100000 UNIT/GM EX CREA: TOPICAL_CREAM | CUTANEOUS | 1 refills | Status: AC

## 2017-10-15 NOTE — ED Triage Notes (Addendum)
Pt BIB [facility] for eval of L sided bruising and blood in urine s/p fall on Wednesday morning at 0400. Pt was not evaluated s/p fall. Went to [facility] urgent [facility] and sent here for further eval. Denies LOC, GCS 15, VSS. Pt w/ small bruise to L flank, no CVA tenderness blt.

## 2017-10-15 NOTE — ED Provider Notes (Addendum)
[facility] EMERGENCY DEPARTMENT Provider Note   CSN: [id number] Arrival date & time: 10/15/17  2030     History   Chief Complaint Chief Complaint  Patient presents with  . Fall    HPI Bonnie Mora is a 84 y.o. female.  HPI   Patient is a 84 year old female with PMHx of HTN, HLD, Parkinson's disease, and recurrent UTI who presents s/p mechanical fall on Wednesday morning at 0400.  Per report patient slipped out of bed and landed on her left side.  She was not evaluated at that time however presented to [facility] today due to complaints of left-sided rib pain with overlying small area of ecchymosis.  Caregiver also noted small amount of blood in underwear.  While at [facility] patient, blood noted in UA, and she was recommended to present to the ED for further evaluation.   History provided by caregiver who resides at home 24/7 with patient and limited 2/2 patient's advanced dementia.  CODE STATUS DNR  Past Medical History:  Diagnosis Date  . Dementia   . Hyperlipidemia   . Hypertension   . Parkinson disease [facility])     Patient Active Problem List   Diagnosis Date Noted  . UTI due to extended-spectrum beta lactamase (ESBL) producing Escherichia coli 04/13/2017  . Complicated UTI (urinary tract infection)   . Confusion   . Hx of completed stroke 03/08/2017  . Acute encephalopathy 03/06/2017  . Chronic left shoulder pain 08/19/2016  . Routine general medical examination at a health care facility 04/25/2016  . Dementia 03/10/2014  . HLD (hyperlipidemia) 03/10/2014  . Essential hypertension 03/10/2014    Past Surgical History:  Procedure Laterality Date  . APPENDECTOMY    . JOINT REPLACEMENT     bilateral knee  . OVARIAN CYST SURGERY    . TONSILLECTOMY AND ADENOIDECTOMY       OB History   None      Home Medications    Prior to Admission medications   Medication Sig Start Date End Date Taking? Authorizing Provider  acetaminophen (TYLENOL) 325 MG  tablet Take 2 tablets (650 mg total) by mouth every 6 (six) hours as needed for mild pain (or Fever >/= 101). 04/17/17   Emokpae, Courage, MD  albuterol (PROVENTIL HFA;VENTOLIN HFA) 108 (90 Base) MCG/ACT inhaler Inhale 2 puffs into the lungs every 6 (six) hours as needed for wheezing or shortness of breath. 05/14/15   Allegra Grana, FNP  ALPRAZolam Prudy Feeler) 0.5 MG tablet Take 1 tablet (0.5 mg total) by mouth 2 (two) times daily as needed for anxiety. 04/17/17   Shon Hale, MD  aspirin 325 MG tablet Take 1 tablet (325 mg total) by mouth daily. 03/10/17   [name], MD  atorvastatin (LIPITOR) 20 MG tablet TAKE 1 TABLET AT 6PM FOR CHOLESTEROL. 07/20/17   [name], MD  citalopram (CELEXA) 20 MG tablet Take 1 tablet (20 mg total) by mouth daily. 04/17/17   Shon Hale, MD  metoprolol succinate (TOPROL-XL) 50 MG 24 hr tablet Take 1 tablet (50 mg total) by mouth daily with breakfast. 04/17/17   Shon Hale, MD  nystatin cream (MYCOSTATIN) Apply to affected area 2 times daily 10/15/17   [name], MD  polyethylene glycol (MIRALAX / GLYCOLAX) packet Take 17 g by mouth daily as needed for mild constipation. Patient not taking: Reported on 05/04/2017 03/10/17   [name], MD  traZODone (DESYREL) 100 MG tablet Take 1 tablet (100 mg total) by mouth at bedtime.  04/25/16   [name], MD    Family History Family History  Problem Relation Age of Onset  . ALS Mother   . Heart disease Father   . Heart disease Maternal Grandfather   . Cancer Maternal Grandfather        breast    Social History Social History   Tobacco Use  . Smoking status: Never Smoker  . Smokeless tobacco: Never Used  Substance Use Topics  . Alcohol use: No    Alcohol/week: 0.0 standard drinks  . Drug use: No     Allergies   Codeine   Review of Systems Review of Systems  Unable to perform ROS: Dementia     Physical Exam Updated Vital Signs BP (!) 177/59   Pulse 60   Temp  98.2 F (36.8 C) (Oral)   Resp 15   Ht 5' (1.524 m)   Wt 71.7 kg   SpO2 97%   BMI 30.86 kg/m   Physical Exam  Constitutional: She appears well-developed and well-nourished. No distress.  HENT:  Head: Normocephalic and atraumatic.  Mouth/Throat: Oropharynx is clear and moist.  Eyes: Pupils are equal, round, and reactive to light. Conjunctivae are normal.  Neck: Normal range of motion. Neck supple.  Cardiovascular: Normal rate, regular rhythm and intact distal pulses.  Pulmonary/Chest: Effort normal and breath sounds normal. No respiratory distress.  Abdominal: Soft. She exhibits no distension. There is no tenderness. There is no guarding.  No CVA TTP.  Musculoskeletal: She exhibits no edema.  Neurological: She is alert.  At baseline mental status.  Oriented to self only.  Moving all 4 extremities spontaneously.  Skin: Skin is warm and dry.  Small ecchymosis noted to left lower ribs.  Erythematous rash noted to lower skin folds.  Psychiatric: She has a normal mood and affect.  Nursing note and vitals reviewed.    ED Treatments / Results  Labs (all labs ordered are listed, but only abnormal results are displayed) Labs Reviewed  URINALYSIS, ROUTINE W REFLEX MICROSCOPIC - Abnormal; Notable for the following components:      Result Value   Hgb urine dipstick SMALL (*)    Leukocytes, UA LARGE (*)    All other components within normal limits  CBC WITH DIFFERENTIAL/PLATELET - Abnormal; Notable for the following components:   Monocytes Absolute 1.1 (*)    All other components within normal limits  COMPREHENSIVE METABOLIC PANEL - Abnormal; Notable for the following components:   Glucose, Bld 116 (*)    BUN 27 (*)    GFR calc non Af Amer 56 (*)    All other components within normal limits  URINE CULTURE    EKG None  Radiology Dg Chest 2 View  Result Date: 10/15/2017 CLINICAL DATA:  84 year old female with left-sided rib pain. EXAM: CHEST - 2 VIEW COMPARISON:  Chest  radiograph dated 03/06/2017 FINDINGS: There is shallow inspiration with bibasilar atelectasis. No focal consolidation, pleural effusion, or pneumothorax. Stable mild cardiomegaly. Atherosclerotic calcification of the aortic arch. No acute osseous pathology. IMPRESSION: No active cardiopulmonary disease. Electronically Signed   By: Elgie Collard M.D.   On: 10/15/2017 22:06    Procedures Procedures (including critical care time)  Medications Ordered in ED Medications - No data to display   Initial Impression / Assessment and Plan / ED Course  I have reviewed the triage vital signs and the nursing notes.  Pertinent labs & imaging results that were available during my care of the patient were reviewed by me  and considered in my medical decision making (see chart for details).     Patient is a 84 year old female with PMHx of HTN, HLD, Parkinson's disease, and recurrent UTI who presents s/p mechanical fall on Wednesday morning at 0400 with left sided rib pain.  No anticoagulation use, LOC, or head trauma reported.  Patient is DNR.  On arrival patient is HDS and confused.  She is at her baseline mental status per caregiver at bedside.  Exam significant for small ecchymosis to lower ribs otherwise atraumatic with benign abdomen and no CVA tenderness.    Labs including CBC and CMP unremarkable.  No evidence for anemia or significant electrolyte abnormality.  UA with large leukocytes and negative nitrites.  No bacteria or RBC present.  No evidence for infection.  Culture obtained and pending.  CXR without acute cardiopulmonary process.  No PTX, focal consolidation to suggest pneumonia, or rib fracture.  Will give nystatin cream for yeast infection.  Stable for DC home.  Old records reviewed.  Imaging and labs reviewed and interpreted by myself and attending and used in the MDM.  Addressed patient question and concerns.  Reviewed discharged instructions with strict precautions given.  Advised patient to  schedule follow-up with primary care provider.  Patient verbalized understanding and agrees with plan.  Patient stable at discharge.  The plan for this patient was discussed with Dr. Jeraldine Loots who voiced agreement and who oversaw evaluation and treatment of this patient.  Final Clinical Impressions(s) / ED Diagnoses   Final diagnoses:  Fall, initial encounter  Yeast infection of the skin    ED Discharge Orders         Ordered    nystatin cream (MYCOSTATIN)     10/15/17 2310             [name], MD 10/16/17 6045    Gerhard Munch, MD 10/16/17 1850

## 2017-10-15 NOTE — Discharge Instructions (Signed)
Follow-up with primary care doctor in 2 days.  Return to ED for any worsening or other concerns.  Apply nystatin cream to skin rash.  Drink plenty of fluids.

## 2017-10-15 NOTE — ED Notes (Signed)
Patient verbalizes understanding of discharge instructions. Opportunity for questioning and answers were provided. Armband removed by staff, pt discharged from ED. Pt leaving with Cala Bradford the pt's caregiver. E signature not available at this time.

## 2017-10-15 NOTE — ED Notes (Signed)
Patient transported to X-ray 

## 2017-10-17 LAB — URINE CULTURE: Culture: <10000 — AB

## 2017-12-03 ENCOUNTER — Other Ambulatory Visit: Payer: Medicare Other | Admitting: Primary Care

## 2017-12-03 DIAGNOSIS — B9629 Other Escherichia coli [E. coli] as the cause of diseases classified elsewhere: Secondary | ICD-10-CM

## 2017-12-03 DIAGNOSIS — Z515 Encounter for palliative care: Secondary | ICD-10-CM

## 2017-12-03 DIAGNOSIS — F02818 Dementia in other diseases classified elsewhere, unspecified severity, with other behavioral disturbance: Secondary | ICD-10-CM

## 2017-12-03 DIAGNOSIS — Z1612 Extended spectrum beta lactamase (ESBL) resistance: Secondary | ICD-10-CM

## 2017-12-03 DIAGNOSIS — G308 Other Alzheimer's disease: Secondary | ICD-10-CM

## 2017-12-03 DIAGNOSIS — F0281 Dementia in other diseases classified elsewhere with behavioral disturbance: Secondary | ICD-10-CM

## 2017-12-03 DIAGNOSIS — N390 Urinary tract infection, site not specified: Secondary | ICD-10-CM

## 2017-12-03 NOTE — Progress Notes (Signed)
[facility] Telephone: [phone] Fax: [phone]  PATIENT NAME: Bonnie Mora DOB: 1926-10-05 MRN: [id number]  PRIMARY CARE PROVIDER:   Laurann Montana, MD  REFERRING PROVIDER:  No referring provider defined for this encounter.  RESPONSIBLE PARTY:   Bonnie Mora [phone]  ASSESSMENT and RECOMMENDATIONS:  1. Weakness: Walker, Round the clock care givers. Continuing to follow after referral in April. Has caregivers whereby she lives at their homes. She is well cared for in this arrangement and seems to enjoy the change of scenery between homes. Meds reviewed. Patient uses walker to ambulate.  2. Dementia: FAST score has increased to 6e from 6C 6 months ago. Caregiver reports sundowning and any agitation can be treated with alpraxolam. Son Bonnie Mora is involved with care. Patient is cheerful and  interactive but calls her son her brother. She has a functional baseline but occasionally falls; these seem to be in tandem with a UTI.   3. UTI- Recurrent. Patient has consulted with urology. Discussed timed voiding, hygiene.  4. Goals of Care:   DNR on record. Family open to hospice when indicated. Patient is very functional at this point. Family want UTIs treated.  Will continue to follow for supportive care, return 3 months. I spent 25 minutes providing this consultation,  from 13:30 to 13:55. More than 50% of the time in this consultation was spent coordinating communication.   HISTORY OF PRESENT ILLNESS:  Bonnie Mora is a 84 y.o. year old female with multiple medical problems including Alzheimer's dementia, recurrent UTIs, HLD, HTN, anxiety. Palliative Care was asked to help address goals of care.   CODE STATUS: DNR  PPS: 30% HOSPICE ELIGIBILITY/DIAGNOSIS: TBD  PAST MEDICAL HISTORY:  Past Medical History:  Diagnosis Date  . Dementia   . Hyperlipidemia   . Hypertension   . Parkinson disease (HCC)     SOCIAL HX:  Social History   Tobacco Use  .  Smoking status: Never Smoker  . Smokeless tobacco: Never Used  Substance Use Topics  . Alcohol use: No    Alcohol/week: 0.0 standard drinks    ALLERGIES:  Allergies  Allergen Reactions  . Codeine Nausea And Vomiting     PERTINENT MEDICATIONS:  Outpatient Encounter Medications as of 12/03/2017  Medication Sig  . acetaminophen (TYLENOL) 325 MG tablet Take 2 tablets (650 mg total) by mouth every 6 (six) hours as needed for mild pain (or Fever >/= 101).  Marland Kitchen albuterol (PROVENTIL HFA;VENTOLIN HFA) 108 (90 Base) MCG/ACT inhaler Inhale 2 puffs into the lungs every 6 (six) hours as needed for wheezing or shortness of breath.  . ALPRAZolam (XANAX) 0.5 MG tablet Take 1 tablet (0.5 mg total) by mouth 2 (two) times daily as needed for anxiety.  Marland Kitchen aspirin 325 MG tablet Take 1 tablet (325 mg total) by mouth daily.  Marland Kitchen atorvastatin (LIPITOR) 20 MG tablet TAKE 1 TABLET AT 6PM FOR CHOLESTEROL.  . citalopram (CELEXA) 20 MG tablet Take 1 tablet (20 mg total) by mouth daily.  . metoprolol succinate (TOPROL-XL) 50 MG 24 hr tablet Take 1 tablet (50 mg total) by mouth daily with breakfast.  . nystatin cream (MYCOSTATIN) Apply to affected area 2 times daily  . polyethylene glycol (MIRALAX / GLYCOLAX) packet Take 17 g by mouth daily as needed for mild constipation. (Patient not taking: Reported on 05/04/2017)  . traZODone (DESYREL) 100 MG tablet Take 1 tablet (100 mg total) by mouth at bedtime.   No facility-administered encounter medications on file as of  12/03/2017.     PHYSICAL EXAM:  VS 98.2-90-18 Pulse Ox 95% General: NAD, frail appearing, obese. Good appetite. Cardiovascular: regular rate and rhythm, S1S2 Pulmonary: clear  Fields, no cough Abdomen: soft, nontender, + bowel sounds, occ. incontinence GU: no suprapubic tenderness, timed voiding for incontinence. MSK: Falls sometimes, often at night.  Extremities: no edema, no joint deformities Skin: no rashes, skin tears common.  Neurological: Weakness,  forgetful, dementia, sundowning behavior  Bonnie MareKathryn M. Wynona CanesSmith DNP, AGPCNP-BC

## 2018-03-24 ENCOUNTER — Telehealth: Payer: Self-pay | Admitting: Adult Health Nurse Practitioner

## 2018-03-24 ENCOUNTER — Other Ambulatory Visit: Payer: Medicare Other | Admitting: Adult Health Nurse Practitioner

## 2018-03-24 DIAGNOSIS — Z515 Encounter for palliative care: Secondary | ICD-10-CM

## 2018-03-24 NOTE — Telephone Encounter (Signed)
Spoke with son to give update on patient's status.  Patient has recurrent UTIs and he states that she does have a follow up with urology in the next couple weeks Chue Berkovich K. Garner Nash NP

## 2018-03-24 NOTE — Progress Notes (Signed)
Therapist, nutritional Palliative Care Consult Note Telephone: [phone]  Fax: [phone] 03/24/2018  PATIENT NAME: Bonnie Mora DOB: 1936-08-15 MRN: [id number]  PRIMARY CARE PROVIDER:   Laurann Montana, MD  REFERRING PROVIDER:  Laurann Montana, MD [phone] Bonnie Mora [address] [place], [place] [address]  RESPONSIBLE PARTY:   Bonnie Mora [phone]    RECOMMENDATIONS and PLAN:  1. Weakness: Uses walker to ambulate. Does require assistance with ADLs.  Patient goes back and forth staying with a couple of caregivers who live across the street from each other.patient has not had any recent falls.  Caregiver states that her appetite is good.  2. Dementia: FAST score of 6C. Caregiver states that she can be tearful when she sundowns and that xanax helps. Son Bonnie Mora is involved with care.   3. UTI- Recurrent. Patient has consulted with urology. Son and caregivers state that she has had recurrent UTIs most of her life.  She did get treated for a UTI a couple weeks ago.  Has a follow up with urology in a couple of weeks  4. Goals of Care:   DNR on record. Family open to hospice when indicated. Patient is very functional at this point. Family want UTIs treated.  Patient stable and will follow up in 2-3 months or as needed  I spent 20 minutes providing this consultation,  from 1:00 to 1:20. More than 50% of the time in this consultation was spent coordinating communication.   HISTORY OF PRESENT ILLNESS:  Bonnie Mora is a 85 y.o. year old female with multiple medical problems including Alzheimer's dementia, recurrent UTIs, HLD, HTN, anxiety. Palliative Care was asked to help address goals of care.   CODE STATUS: DNR  PPS: 50% HOSPICE ELIGIBILITY/DIAGNOSIS: TBD  PHYSICAL EXAM:   General: patient sitting on couch in NAD Cardiovascular: regular rate and rhythm Pulmonary: lung sounds clear, normal respiratory effort Abdomen: soft, nontender, + bowel  sounds GU: no suprapubic tenderness Extremities: no edema, no joint deformities Skin: no rashes Neurological: Weakness, forgetful   PAST MEDICAL HISTORY:  Past Medical History:  Diagnosis Date  . Dementia   . Hyperlipidemia   . Hypertension   . Parkinson disease (HCC)     SOCIAL HX:  Social History   Tobacco Use  . Smoking status: Never Smoker  . Smokeless tobacco: Never Used  Substance Use Topics  . Alcohol use: No    Alcohol/week: 0.0 standard drinks    ALLERGIES:  Allergies  Allergen Reactions  . Codeine Nausea And Vomiting     PERTINENT MEDICATIONS:  Outpatient Encounter Medications as of 03/24/2018  Medication Sig  . acetaminophen (TYLENOL) 325 MG tablet Take 2 tablets (650 mg total) by mouth every 6 (six) hours as needed for mild pain (or Fever >/= 101).  Marland Kitchen ALPRAZolam (XANAX) 0.5 MG tablet Take 1 tablet (0.5 mg total) by mouth 2 (two) times daily as needed for anxiety.  Marland Kitchen aspirin 325 MG tablet Take 1 tablet (325 mg total) by mouth daily.  Marland Kitchen atorvastatin (LIPITOR) 20 MG tablet TAKE 1 TABLET AT 6PM FOR CHOLESTEROL.  . citalopram (CELEXA) 20 MG tablet Take 1 tablet (20 mg total) by mouth daily.  . metoprolol succinate (TOPROL-XL) 50 MG 24 hr tablet Take 1 tablet (50 mg total) by mouth daily with breakfast.  . nystatin cream (MYCOSTATIN) Apply to affected area 2 times daily  . polyethylene glycol (MIRALAX / GLYCOLAX) packet Take 17 g by mouth daily as needed for mild  constipation. (Patient not taking: Reported on 05/04/2017)  . traZODone (DESYREL) 100 MG tablet Take 1 tablet (100 mg total) by mouth at bedtime.   No facility-administered encounter medications on file as of 03/24/2018.      Timesha Cervantez Marlena Clipper, NP

## 2018-05-07 ENCOUNTER — Telehealth: Payer: Self-pay | Admitting: Adult Health Nurse Practitioner

## 2018-05-07 NOTE — Telephone Encounter (Signed)
Caregiver, Cala Bradford, called on-call services early this morning stating that patient had been up all night with increased confusion.  She had been treated for a UTI 2 weeks ago.  Cala Bradford also stated that she was treated for a UTI prior to that and PCP, Dr. Cliffton Asters, had mentioned doing IV antibiotics at home.  Instructed caregiver to give PCP a call to get new orders as it seems she has a plan already in place.  Caregiver to give PCP a call once office opens. Sameena Artus K. Garner Nash NP

## 2018-05-14 ENCOUNTER — Other Ambulatory Visit: Payer: Self-pay

## 2018-05-14 ENCOUNTER — Other Ambulatory Visit: Payer: Medicare Other | Admitting: Adult Health Nurse Practitioner

## 2018-05-14 ENCOUNTER — Telehealth: Payer: Self-pay | Admitting: Adult Health Nurse Practitioner

## 2018-05-14 DIAGNOSIS — Z515 Encounter for palliative care: Secondary | ICD-10-CM

## 2018-05-14 NOTE — Progress Notes (Signed)
[facility] Consult Note Telephone: [phone]  Fax: [phone]  PATIENT NAME: Bonnie Mora DOB: Apr 15, 1926 MRN: [id number]  PRIMARY CARE PROVIDER:   Laurann MontanaWhite, Cynthia, MD  REFERRING PROVIDER:  Laurann MontanaWhite, Cynthia, MD [phone] [address] [place] [place], [place] [address]  RESPONSIBLE PARTY:  Inis SizerSon Greg [phone]   Due to the COVID-19 crisis, this visit was done via telemedicine and it was initiated and consent by this patient and or family. Video-audio (telehealth) contact was unable to be done due to technical barriers from the patient's side.       RECOMMENDATIONS and PLAN:  1.  Weakness:Uses walker to ambulate. Does require assistance with ADLs. Patient goes back and forth staying with a couple of caregivers who live across the street from each other.Patient has not had any recent falls.  Caregiver states that her appetite is good. Able to get food and supplies they need during pandemic.  Staying home and using appropriate precautions when having to go out  2. Dementia:FAST score of 6C. Caregiver states that she can be tearful when she sundowns and that xanax helps. Son Tammy SoursGreg is involved with care.   3. UTI- Recurrent. Patient has consulted with urology.Son and caregivers state that she has had recurrent UTIs most of her life.  Had a UTI about a couple weeks ago that was treated with Cipro and it didn't go away so was then treated with fosfomycin and patient is doing better  4. Goals of Care: DNR on record. Family open to hospice when indicated. Patient is very functional at this point. Family want UTIs treated.    I spent 20 minutes providing this consultation,  from 12:00 to 12:20. More than 50% of the time in this consultation was spent coordinating communication.   HISTORY OF PRESENT ILLNESS:  Bonnie Mora [name] is a 85 y.o. year old female with multiple medical problems including Alzheimer's dementia, recurrent  UTIs, HLD, HTN, anxiety. Palliative Care was asked to help address goals of care.   CODE STATUS: DNR  PPS: 50% HOSPICE ELIGIBILITY/DIAGNOSIS: TBD  PAST MEDICAL HISTORY:  Past Medical History:  Diagnosis Date  . Dementia   . Hyperlipidemia   . Hypertension   . Parkinson disease (HCC)     SOCIAL HX:  Social History   Tobacco Use  . Smoking status: Never Smoker  . Smokeless tobacco: Never Used  Substance Use Topics  . Alcohol use: No    Alcohol/week: 0.0 standard drinks    ALLERGIES:  Allergies  Allergen Reactions  . Codeine Nausea And Vomiting     PERTINENT MEDICATIONS:  Outpatient Encounter Medications as of 05/14/2018  Medication Sig  . acetaminophen (TYLENOL) 325 MG tablet Take 2 tablets (650 mg total) by mouth every 6 (six) hours as needed for mild pain (or Fever >/= 101).  Marland Kitchen. ALPRAZolam (XANAX) 0.5 MG tablet Take 1 tablet (0.5 mg total) by mouth 2 (two) times daily as needed for anxiety.  Marland Kitchen. aspirin 325 MG tablet Take 1 tablet (325 mg total) by mouth daily.  Marland Kitchen. atorvastatin (LIPITOR) 20 MG tablet TAKE 1 TABLET AT 6PM FOR CHOLESTEROL.  . citalopram (CELEXA) 20 MG tablet Take 1 tablet (20 mg total) by mouth daily.  . metoprolol succinate (TOPROL-XL) 50 MG 24 hr tablet Take 1 tablet (50 mg total) by mouth daily with breakfast.  . nystatin cream (MYCOSTATIN) Apply to affected area 2 times daily  . polyethylene glycol (MIRALAX / GLYCOLAX) packet Take 17 g by  mouth daily as needed for mild constipation. (Patient not taking: Reported on 05/04/2017)  . traZODone (DESYREL) 100 MG tablet Take 1 tablet (100 mg total) by mouth at bedtime.   No facility-administered encounter medications on file as of 05/14/2018.       Mykaila Blunck Marlena Clipper, NP

## 2018-05-14 NOTE — Telephone Encounter (Signed)
Called and set up telephonic visit for later this morning Bonnie Glasco K. Abas Leicht NP 

## 2018-09-20 ENCOUNTER — Observation Stay (HOSPITAL_COMMUNITY)
Admission: EM | Admit: 2018-09-20 | Discharge: 2018-09-21 | Disposition: A | Discharge status: Home / Self Care | Payer: Medicare Other | Attending: Family Medicine | Admitting: Family Medicine

## 2018-09-20 ENCOUNTER — Encounter (HOSPITAL_COMMUNITY): Payer: Self-pay | Admitting: Emergency Medicine

## 2018-09-20 ENCOUNTER — Other Ambulatory Visit: Payer: Self-pay

## 2018-09-20 ENCOUNTER — Emergency Department (HOSPITAL_COMMUNITY): Payer: Medicare Other

## 2018-09-20 DIAGNOSIS — E785 Hyperlipidemia, unspecified: Secondary | ICD-10-CM | POA: Insufficient documentation

## 2018-09-20 DIAGNOSIS — Z20828 Contact with and (suspected) exposure to other viral communicable diseases: Secondary | ICD-10-CM | POA: Insufficient documentation

## 2018-09-20 DIAGNOSIS — J189 Pneumonia, unspecified organism: Principal | ICD-10-CM | POA: Insufficient documentation

## 2018-09-20 DIAGNOSIS — G9389 Other specified disorders of brain: Secondary | ICD-10-CM | POA: Diagnosis not present

## 2018-09-20 DIAGNOSIS — R350 Frequency of micturition: Secondary | ICD-10-CM | POA: Diagnosis not present

## 2018-09-20 DIAGNOSIS — I10 Essential (primary) hypertension: Secondary | ICD-10-CM | POA: Diagnosis not present

## 2018-09-20 DIAGNOSIS — G20 Parkinson's disease: Secondary | ICD-10-CM | POA: Diagnosis not present

## 2018-09-20 DIAGNOSIS — R3915 Urgency of urination: Secondary | ICD-10-CM | POA: Diagnosis not present

## 2018-09-20 DIAGNOSIS — R4182 Altered mental status, unspecified: Secondary | ICD-10-CM | POA: Insufficient documentation

## 2018-09-20 DIAGNOSIS — Z7982 Long term (current) use of aspirin: Secondary | ICD-10-CM | POA: Diagnosis not present

## 2018-09-20 DIAGNOSIS — F0390 Unspecified dementia without behavioral disturbance: Secondary | ICD-10-CM | POA: Insufficient documentation

## 2018-09-20 DIAGNOSIS — J181 Lobar pneumonia, unspecified organism: Secondary | ICD-10-CM | POA: Diagnosis not present

## 2018-09-20 DIAGNOSIS — F419 Anxiety disorder, unspecified: Secondary | ICD-10-CM | POA: Insufficient documentation

## 2018-09-20 DIAGNOSIS — Z79899 Other long term (current) drug therapy: Secondary | ICD-10-CM | POA: Insufficient documentation

## 2018-09-20 DIAGNOSIS — Z8673 Personal history of transient ischemic attack (TIA), and cerebral infarction without residual deficits: Secondary | ICD-10-CM | POA: Insufficient documentation

## 2018-09-20 LAB — URINALYSIS, ROUTINE W REFLEX MICROSCOPIC
Bilirubin Urine: NEGATIVE
Glucose, UA: NEGATIVE mg/dL
Hgb urine dipstick: NEGATIVE
Ketones, ur: NEGATIVE mg/dL
Leukocytes,Ua: NEGATIVE
Nitrite: NEGATIVE
Protein, ur: NEGATIVE mg/dL
Specific Gravity, Urine: 1.012 (ref 1.005–1.030)
pH: 5 (ref 5.0–8.0)

## 2018-09-20 LAB — COMPREHENSIVE METABOLIC PANEL
ALT: 17 U/L (ref 0–44)
AST: 19 U/L (ref 15–41)
Albumin: 3.3 g/dL — ABNORMAL LOW (ref 3.5–5.0)
Alkaline Phosphatase: 64 U/L (ref 38–126)
Anion gap: 9 (ref 5–15)
BUN: 15 mg/dL (ref 8–23)
CO2: 27 mmol/L (ref 22–32)
Calcium: 9 mg/dL (ref 8.9–10.3)
Chloride: 104 mmol/L (ref 98–111)
Creatinine, Ser: 0.83 mg/dL (ref 0.44–1.00)
GFR calc Af Amer: >60 mL/min (ref >60)
GFR calc non Af Amer: >60 mL/min (ref >60)
Glucose, Bld: 144 mg/dL — ABNORMAL HIGH (ref 70–99)
Potassium: 4.2 mmol/L (ref 3.5–5.1)
Sodium: 140 mmol/L (ref 135–145)
Total Bilirubin: 0.2 mg/dL — ABNORMAL LOW (ref 0.3–1.2)
Total Protein: 6.5 g/dL (ref 6.5–8.1)

## 2018-09-20 LAB — BLOOD GAS, VENOUS
Bicarbonate: 30.3 mmol/L — ABNORMAL HIGH (ref 20.0–28.0)
FIO2: 21
O2 Saturation: 65.2 %
pCO2, Ven: 53.7 mmHg (ref 44.0–60.0)
pH, Ven: 7.37 (ref 7.250–7.430)
pO2, Ven: 36 mmHg (ref 32.0–45.0)

## 2018-09-20 LAB — CBG MONITORING, ED: Glucose-Capillary: 116 mg/dL — ABNORMAL HIGH (ref 70–99)

## 2018-09-20 LAB — LACTIC ACID, PLASMA: Lactic Acid, Venous: 1.5 mmol/L (ref 0.5–1.9)

## 2018-09-20 LAB — CBC
HCT: 38.4 % (ref 36.0–46.0)
Hemoglobin: 12.2 g/dL (ref 12.0–15.0)
MCH: 30.8 pg (ref 26.0–34.0)
MCHC: 31.8 g/dL (ref 30.0–36.0)
MCV: 97 fL (ref 80.0–100.0)
Platelets: 217 10*3/uL (ref 150–400)
RBC: 3.96 MIL/uL (ref 3.87–5.11)
RDW: 13.9 % (ref 11.5–15.5)
WBC: 5.5 10*3/uL (ref 4.0–10.5)
nRBC: 0 % (ref 0.0–0.2)

## 2018-09-20 LAB — AMMONIA: Ammonia: 35 umol/L (ref 9–35)

## 2018-09-20 LAB — SARS CORONAVIRUS 2 BY RT PCR (HOSPITAL ORDER, PERFORMED IN ~~LOC~~ HOSPITAL LAB): SARS Coronavirus 2: NEGATIVE

## 2018-09-20 MED ORDER — SODIUM CHLORIDE 0.9 % IV SOLN
1000.0000 mL | INTRAVENOUS | Status: AC
  Administered 2018-09-20: 1000 mL via INTRAVENOUS

## 2018-09-20 MED ORDER — SODIUM CHLORIDE 0.9 % IV SOLN
1.0000 g | Freq: Once | INTRAVENOUS | Status: AC
  Administered 2018-09-20: 1 g via INTRAVENOUS
  Filled 2018-09-20: qty 10

## 2018-09-20 MED ORDER — ENOXAPARIN SODIUM 40 MG/0.4ML ~~LOC~~ SOLN
40.0000 mg | SUBCUTANEOUS | Status: DC
  Administered 2018-09-20: 23:00:00 40 mg via SUBCUTANEOUS
  Filled 2018-09-20 (×2): qty 0.4

## 2018-09-20 MED ORDER — SODIUM CHLORIDE 0.9 % IV SOLN
1.0000 g | INTRAVENOUS | Status: DC
  Filled 2018-09-20: qty 10

## 2018-09-20 MED ORDER — SODIUM CHLORIDE 0.9 % IV BOLUS (SEPSIS)
500.0000 mL | Freq: Once | INTRAVENOUS | Status: AC
  Administered 2018-09-20: 18:00:00 500 mL via INTRAVENOUS

## 2018-09-20 MED ORDER — SODIUM CHLORIDE 0.9 % IV SOLN
500.0000 mg | INTRAVENOUS | Status: DC
  Filled 2018-09-20: qty 500

## 2018-09-20 MED ORDER — SODIUM CHLORIDE 0.9 % IV SOLN
1000.0000 mL | INTRAVENOUS | Status: DC
  Administered 2018-09-20: 18:00:00 1000 mL via INTRAVENOUS

## 2018-09-20 MED ORDER — SODIUM CHLORIDE 0.9 % IV SOLN
500.0000 mg | Freq: Once | INTRAVENOUS | Status: DC
  Administered 2018-09-20: 21:00:00 500 mg via INTRAVENOUS
  Filled 2018-09-20: qty 500

## 2018-09-20 NOTE — ED Provider Notes (Signed)
[facility] DEPT Provider Note   CSN: [id number] Arrival date & time: 09/20/18  1621     History   Chief Complaint Chief Complaint  Patient presents with  . Altered Mental Status    HPI Bonnie Mora is a 85 y.o. female.     HPI Patient presents to the ED for altered mental status.  Patient has a history of dementia.  However normally she is able to speak more clearly and follow commands.  Patient's caregiver noted the patient started having urinary symptoms about a week ago.  She has been going to the bathroom frequently and always small amounts.  She went to an urgent care and had a urinalysis and was given a dose of an antibiotic.  Patient symptoms improved however the last couple days she has had worsening confusion.  She continues to have the urinary urgency and frequency.  She seems to be weaker.  She has been talking to people that are not in the room and she is not making any sense oftentimes when she is speaking.  She seems to be reaching out for objects that are not actually there.  Patient was post to see her doctor today but her caregiver thought because of her worsening symptoms she should come to the ED. Past Medical History:  Diagnosis Date  . Dementia ([place])   . Hyperlipidemia   . Hypertension   . Parkinson disease [facility])     Patient Active Problem List   Diagnosis Date Noted  . UTI due to extended-spectrum beta lactamase (ESBL) producing Escherichia coli 04/13/2017  . Complicated UTI (urinary tract infection)   . Confusion   . Hx of completed stroke 03/08/2017  . Acute encephalopathy 03/06/2017  . Chronic left shoulder pain 08/19/2016  . Routine general medical examination at a health care facility 04/25/2016  . Dementia ([place]) 03/10/2014  . HLD (hyperlipidemia) 03/10/2014  . Essential hypertension 03/10/2014    Past Surgical History:  Procedure Laterality Date  . APPENDECTOMY    . JOINT REPLACEMENT     bilateral knee  .  OVARIAN CYST SURGERY    . TONSILLECTOMY AND ADENOIDECTOMY       OB History   No obstetric history on file.      Home Medications    Prior to Admission medications   Medication Sig Start Date End Date Taking? Authorizing Provider  acetaminophen (TYLENOL) 325 MG tablet Take 2 tablets (650 mg total) by mouth every 6 (six) hours as needed for mild pain (or Fever >/= 101). 04/17/17   Emokpae, Courage, MD  ALPRAZolam (XANAX) 0.5 MG tablet Take 1 tablet (0.5 mg total) by mouth 2 (two) times daily as needed for anxiety. 04/17/17   Roxan Hockey, MD  aspirin 325 MG tablet Take 1 tablet (325 mg total) by mouth daily. 03/10/17   Aline August, MD  atorvastatin (LIPITOR) 20 MG tablet TAKE 1 TABLET AT 6PM FOR CHOLESTEROL. 07/20/17   Hoyt Koch, MD  citalopram (CELEXA) 20 MG tablet Take 1 tablet (20 mg total) by mouth daily. 04/17/17   Roxan Hockey, MD  metoprolol succinate (TOPROL-XL) 50 MG 24 hr tablet Take 1 tablet (50 mg total) by mouth daily with breakfast. 04/17/17   Roxan Hockey, MD  nystatin cream (MYCOSTATIN) Apply to affected area 2 times daily 10/15/17   Fabian November, MD  polyethylene glycol (MIRALAX / GLYCOLAX) packet Take 17 g by mouth daily as needed for mild constipation. Patient not taking: Reported on 05/04/2017 03/10/17  [name], MD  traZODone (DESYREL) 100 MG tablet Take 1 tablet (100 mg total) by mouth at bedtime. 04/25/16   [name], MD    Family History Family History  Problem Relation Age of Onset  . ALS Mother   . Heart disease Father   . Heart disease Maternal Grandfather   . Cancer Maternal Grandfather        breast    Social History Social History   Tobacco Use  . Smoking status: Never Smoker  . Smokeless tobacco: Never Used  Substance Use Topics  . Alcohol use: No    Alcohol/week: 0.0 standard drinks  . Drug use: No     Allergies   Codeine   Review of Systems Review of Systems  All other systems reviewed and are  negative.    Physical Exam Updated Vital Signs BP (!) 130/55   Pulse (!) 55   Temp 97.9 F (36.6 C) (Oral)   Resp 19   SpO2 97%   Physical Exam Vitals signs and nursing note reviewed.  Constitutional:      General: She is not in acute distress.    Appearance: She is well-developed. She is not toxic-appearing.  HENT:     Head: Normocephalic and atraumatic.     Right Ear: External ear normal.     Left Ear: External ear normal.  Eyes:     General: No scleral icterus.       Right eye: No discharge.        Left eye: No discharge.     Conjunctiva/sclera: Conjunctivae normal.  Neck:     Musculoskeletal: Neck supple.     Trachea: No tracheal deviation.  Cardiovascular:     Rate and Rhythm: Normal rate and regular rhythm.  Pulmonary:     Effort: Pulmonary effort is normal. No respiratory distress.     Breath sounds: Normal breath sounds. No stridor. No wheezing or rales.  Abdominal:     General: Bowel sounds are normal. There is no distension.     Palpations: Abdomen is soft.     Tenderness: There is no abdominal tenderness. There is no guarding or rebound.  Musculoskeletal:        General: No tenderness.  Skin:    General: Skin is warm and dry.     Findings: No rash.  Neurological:     Mental Status: She is alert.     Cranial Nerves: No cranial nerve deficit (no facial droop,).     Motor: No abnormal muscle tone or seizure activity.     Comments: Patient's eyes are open, she will look at me when I speak to her, intermittently smiles, her speech is nonsensical, patient does not follow commands but I did observe her move all 4 extremities      ED Treatments / Results  Labs (all labs ordered are listed, but only abnormal results are displayed) Labs Reviewed  COMPREHENSIVE METABOLIC PANEL - Abnormal; Notable for the following components:      Result Value   Glucose, Bld 144 (*)    Albumin 3.3 (*)    Total Bilirubin 0.2 (*)    All other components within normal limits   BLOOD GAS, VENOUS - Abnormal; Notable for the following components:   Bicarbonate 30.3 (*)    All other components within normal limits  CBG MONITORING, ED - Abnormal; Notable for the following components:   Glucose-Capillary 116 (*)    All other components within normal limits  SARS CORONAVIRUS  2 (HOSPITAL ORDER, PERFORMED IN [facility])  CBC  LACTIC ACID, PLASMA  URINALYSIS, ROUTINE W REFLEX MICROSCOPIC  AMMONIA    EKG None  Radiology Ct Head Wo Contrast  Result Date: 09/20/2018 CLINICAL DATA:  85 year old female with altered mental status. EXAM: CT HEAD WITHOUT CONTRAST TECHNIQUE: Contiguous axial images were obtained from the base of the skull through the vertex without intravenous contrast. COMPARISON:  03/08/2018 CT and prior exams FINDINGS: Brain: No evidence of acute infarction, hemorrhage, hydrocephalus, extra-axial collection or mass lesion/mass effect. Atrophy, chronic small-vessel white matter ischemic changes and RIGHT frontal encephalomalacia again noted. Vascular: No hyperdense vessel or unexpected calcification. Skull: Normal. Negative for fracture or focal lesion. Sinuses/Orbits: No acute finding. Other: None. IMPRESSION: 1. No evidence of acute intracranial abnormality. 2. Atrophy and chronic small-vessel white matter ischemic changes. Electronically Signed   By: Harmon PierJeffrey  Hu M.D.   On: 09/20/2018 19:23   Dg Chest Portable 1 View  Result Date: 09/20/2018 CLINICAL DATA:  Altered level of consciousness. EXAM: PORTABLE CHEST 1 VIEW COMPARISON:  03/08/2018 FINDINGS: There appears to be a new retrocardiac opacity. The heart size is enlarged but stable from prior study. There are persistent aortic calcifications. There is no acute osseous abnormality. There are advanced degenerative changes of the right glenohumeral joint. There is some stable elevation of the right hemidiaphragm. IMPRESSION: Apparent new retrocardiac opacity concerning for developing pneumonia or  aspiration in the appropriate clinical setting. Electronically Signed   By: Katherine Mantlehristopher  Green M.D.   On: 09/20/2018 18:26    Procedures Procedures (including critical care time)  Medications Ordered in ED Medications  sodium chloride 0.9 % bolus 500 mL (500 mLs Intravenous New Bag/Given 09/20/18 1753)    Followed by  0.9 %  sodium chloride infusion (1,000 mLs Intravenous New Bag/Given 09/20/18 1754)  azithromycin (ZITHROMAX) 500 mg in sodium chloride 0.9 % 250 mL IVPB (has no administration in time range)  cefTRIAXone (ROCEPHIN) 1 g in sodium chloride 0.9 % 100 mL IVPB (1 g Intravenous New Bag/Given 09/20/18 1923)     Initial Impression / Assessment and Plan / ED Course  I have reviewed the triage vital signs and the nursing notes.  Pertinent labs & imaging results that were available during my care of the patient were reviewed by me and considered in my medical decision making (see chart for details).  Clinical Course as of Sep 19 2025  Mon Sep 20, 2018  1914 CBC and electrolyte panel unremarkable.  VBG is normal.  UA is pending.  In and out cath ordered.   [JK]  1914 Chest x-ray suggest possible pneumonia.   [JK]    Clinical Course User Index [JK] Linwood DibblesKnapp, Aryia Delira, MD     Labs and x-rays were reviewed.  Chest x-ray does suggest possible pneumonia.  Patient's caregiver states in retrospect the patient has been coughing a lot .  She had been on antibiotics recently.  Considering her recent use of antibiotics her age and comorbidities I will consult the medical service for admission regarding her possible pneumonia.  COVID-19 test has been sent off but overall she is very low risk exposure category.  Final Clinical Impressions(s) / ED Diagnoses   Final diagnoses:  Community acquired pneumonia, unspecified laterality      Linwood DibblesKnapp, Prescilla Monger, MD 09/20/18 2027

## 2018-09-20 NOTE — ED Notes (Addendum)
Pt thrashing around bed in attempted to release the cuff on her arm and will not let staff recheck blood pressure.

## 2018-09-20 NOTE — ED Triage Notes (Signed)
Patient BIB caregiver, reports patient was treated for UTI last week and symptoms resolved. States yesterday patient became more altered and combative. Hx dementia.

## 2018-09-20 NOTE — H&P (Signed)
TRH H&P    Patient Demographics:    Bonnie Mora, is a 85 y.o. female  MRN: [id number]  DOB - 1926-12-05  Admit Date - 09/20/2018  Referring MD/NP/PA:  Marye Round  Outpatient Primary MD for the patient is Harlan Stains, MD  Patient coming from:  home  Chief complaint- altered mental status, cough   HPI:    Bonnie Mora  is a 85 y.o. female,  w hypertension, hyperlipidemia, Parkinsons, Dementia, apparently presents with hx of cough, tx with abx (most recently augmentin), without benefit. Pt has dry cough for the past several weeks.  Pt for the past 2 day has had altered mental status per family.  No fever, no chills, no cp, no palp, no sob, no n/v, no diarrhea, no dysuria, no hematuria.   In ED,  T 97.9  P 55, R 18, Bp 144/81  Pox 97% on RA  CT brain IMPRESSION: 1. No evidence of acute intracranial abnormality. 2. Atrophy and chronic small-vessel white matter ischemic changes.  CXR IMPRESSION: Apparent new retrocardiac opacity concerning for developing pneumonia or aspiration in the appropriate clinical setting.  na 140, K 4.2, Bun 15, Creatinine 0.83 Alb 3.3 Ast 19, Alt 17, Alk phos 64, T. Bili 0.2 Wbc 5.5, Hgb 12.2, Plt 217 Lactic acid 1.5  Abg ph 7.370, pCo2 53.7, Po2 36 (venous ?)  Ammonia 35  covid -19 negative.   Pt will be admitted for CAP    Review of systems:    In addition to the HPI above,  No Fever-chills, No Headache, No changes with Vision or hearing, No problems swallowing food or Liquids, No Chest pain, No Shortness of Breath, No Abdominal pain, No Nausea or Vomiting, bowel movements are regular, No Blood in stool or Urine, No dysuria, No new skin rashes or bruises, No new joints pains-aches,  No new weakness, tingling, numbness in any extremity, No recent weight gain or loss, No polyuria, polydypsia or polyphagia, No significant Mental Stressors.  All other  systems reviewed and are negative.    Past History of the following :    Past Medical History:  Diagnosis Date  . Dementia ([place])   . Hyperlipidemia   . Hypertension   . Parkinson disease [facility])       Past Surgical History:  Procedure Laterality Date  . APPENDECTOMY    . JOINT REPLACEMENT     bilateral knee  . OVARIAN CYST SURGERY    . TONSILLECTOMY AND ADENOIDECTOMY        Social History:      Social History   Tobacco Use  . Smoking status: Never Smoker  . Smokeless tobacco: Never Used  Substance Use Topics  . Alcohol use: No    Alcohol/week: 0.0 standard drinks       Family History :     Family History  Problem Relation Age of Onset  . ALS Mother   . Heart disease Father   . Heart disease Maternal Grandfather   . Cancer Maternal Grandfather  breast       Home Medications:   Prior to Admission medications   Medication Sig Start Date End Date Taking? Authorizing Provider  acetaminophen (TYLENOL) 325 MG tablet Take 2 tablets (650 mg total) by mouth every 6 (six) hours as needed for mild pain (or Fever >/= 101). 04/17/17  Yes Emokpae, Courage, MD  ALPRAZolam (XANAX) 0.5 MG tablet Take 1 tablet (0.5 mg total) by mouth 2 (two) times daily as needed for anxiety. 04/17/17  Yes Emokpae, Courage, MD  aspirin 325 MG tablet Take 1 tablet (325 mg total) by mouth daily. 03/10/17  Yes Aline August, MD  atorvastatin (LIPITOR) 20 MG tablet TAKE 1 TABLET AT 6PM FOR CHOLESTEROL. 07/20/17  Yes Hoyt Koch, MD  citalopram (CELEXA) 20 MG tablet Take 1 tablet (20 mg total) by mouth daily. 04/17/17  Yes Roxan Hockey, MD  metoprolol succinate (TOPROL-XL) 50 MG 24 hr tablet Take 1 tablet (50 mg total) by mouth daily with breakfast. 04/17/17  Yes Emokpae, Courage, MD  nystatin cream (MYCOSTATIN) Apply to affected area 2 times daily 10/15/17  Yes Rice, Doroteo Bradford, MD  omeprazole (PRILOSEC) 20 MG capsule Take 20 mg by mouth daily.   Yes [provider]  traZODone  (DESYREL) 100 MG tablet Take 1 tablet (100 mg total) by mouth at bedtime. Patient taking differently: Take 200 mg by mouth at bedtime.  04/25/16  Yes Hoyt Koch, MD  trimethoprim (TRIMPEX) 100 MG tablet Take 100 mg by mouth at bedtime.   Yes [provider]  polyethylene glycol (MIRALAX / GLYCOLAX) packet Take 17 g by mouth daily as needed for mild constipation. Patient not taking: Reported on 05/04/2017 03/10/17   Aline August, MD     Allergies:     Allergies  Allergen Reactions  . Codeine Nausea And Vomiting     Physical Exam:   Vitals  Blood pressure (!) 164/109, pulse 83, temperature 97.9 F (36.6 C), temperature source Oral, resp. rate (!) 24, height 5' (1.524 m), weight 65.5 kg, SpO2 91 %.  1.  General: aoxo1 (person)  2. Psychiatric: euthymic  3. Neurologic: cn2-12 intact, reflexes 2+ symmetric, diffuse with no clonus, motor 5/5 in all 4ext Slight bradykinesia Hard of hearing  4. HEENMT:  Anicteric, pupils 1.28m symmetric, direct, consensual intact Blue eyes Neck: no jvd  5. Respiratory : Crackles about 1/4 up on the left, no wheezing  6. Cardiovascular : rrr s1, s2, 1/6 sem rusb  7. Gastrointestinal: Abd: soft, nt,nd, +bs  8. Skin:  Ext: no c/c/e, no rash  9.Musculoskeletal:  Good ROM    Data Review:    CBC Recent Labs  Lab 09/20/18 1652  WBC 5.5  HGB 12.2  HCT 38.4  PLT 217  MCV 97.0  MCH 30.8  MCHC 31.8  RDW 13.9   ------------------------------------------------------------------------------------------------------------------  Results for orders placed or performed during the hospital encounter of 09/20/18 (from the past 48 hour(s))  Comprehensive metabolic panel     Status: Abnormal   Collection Time: 09/20/18  4:52 PM  Result Value Ref Range   Sodium 140 135 - 145 mmol/L   Potassium 4.2 3.5 - 5.1 mmol/L   Chloride 104 98 - 111 mmol/L   CO2 27 22 - 32 mmol/L   Glucose, Bld 144 (H) 70 - 99 mg/dL   BUN 15 8 -  23 mg/dL   Creatinine, Ser 0.83 0.44 - 1.00 mg/dL   Calcium 9.0 8.9 - 10.3 mg/dL   Total Protein 6.5 6.5 -  8.1 g/dL   Albumin 3.3 (L) 3.5 - 5.0 g/dL   AST 19 15 - 41 U/L   ALT 17 0 - 44 U/L   Alkaline Phosphatase 64 38 - 126 U/L   Total Bilirubin 0.2 (L) 0.3 - 1.2 mg/dL   GFR calc non Af Amer >60 >60 mL/min   GFR calc Af Amer >60 >60 mL/min   Anion gap 9 5 - 15    Comment: Performed at [facility], [place] [address] [place], [place] [address]  CBC     Status: None   Collection Time: 09/20/18  4:52 PM  Result Value Ref Range   WBC 5.5 4.0 - 10.5 K/uL   RBC 3.96 3.87 - 5.11 MIL/uL   Hemoglobin 12.2 12.0 - 15.0 g/dL   HCT 38.4 36.0 - 46.0 %   MCV 97.0 80.0 - 100.0 fL   MCH 30.8 26.0 - 34.0 pg   MCHC 31.8 30.0 - 36.0 g/dL   RDW 13.9 11.5 - 15.5 %   Platelets 217 150 - 400 K/uL   nRBC 0.0 0.0 - 0.2 %    Comment: Performed at [facility], [place] [address] [place], [place] [address]  Lactic acid, plasma     Status: None   Collection Time: 09/20/18  4:56 PM  Result Value Ref Range   Lactic Acid, Venous 1.5 0.5 - 1.9 mmol/L    Comment: Performed at [facility], [place] [address], [place], [place] [address]  CBG monitoring, ED     Status: Abnormal   Collection Time: 09/20/18  5:12 PM  Result Value Ref Range   Glucose-Capillary 116 (H) 70 - 99 mg/dL  Ammonia     Status: None   Collection Time: 09/20/18  5:49 PM  Result Value Ref Range   Ammonia 35 9 - 35 umol/L    Comment: Performed at [facility], [place] [address] [place], [place] [address]  Blood gas, venous (at [facility] and [facility], not at [facility])     Status: Abnormal   Collection Time: 09/20/18  5:49 PM  Result Value Ref Range   FIO2 21.00    Delivery systems ROOM AIR    pH, Ven 7.370 7.250 - 7.430   pCO2, Ven 53.7 44.0 - 60.0 mmHg   pO2, Ven 36.0 32.0 - 45.0 mmHg   Bicarbonate 30.3 (H) 20.0 - 28.0 mmol/L   O2 Saturation 65.2 %   Collection site VEIN    Drawn by  RN    Sample type VEIN     Comment: Performed at [facility], [place] [address] [place], [place] [address]  SARS Coronavirus 2 [facility] order, Performed in [facility] hospital lab)     Status: None   Collection Time: 09/20/18  5:49 PM  Result Value Ref Range   SARS Coronavirus 2 NEGATIVE NEGATIVE    Comment: (NOTE) If result is NEGATIVE SARS-CoV-2 target nucleic acids are NOT DETECTED. The SARS-CoV-2 RNA is generally detectable in upper and lower  respiratory specimens during the acute phase of infection. The lowest  concentration of SARS-CoV-2 viral copies this assay can detect is 250  copies / mL. A negative result does not preclude SARS-CoV-2 infection  and should not be used as the sole basis for treatment or other  patient management decisions.  A negative result may occur with  improper specimen collection / handling, submission of specimen other  than nasopharyngeal swab, presence of viral mutation(s) within the  areas targeted by this assay, and inadequate number of viral copies  (<250 copies / mL). A negative result must be combined with clinical  observations, patient history, and epidemiological information. If result is POSITIVE SARS-CoV-2 target nucleic acids are DETECTED. The SARS-CoV-2 RNA is generally detectable in upper and lower  respiratory specimens dur ing the acute phase of infection.  Positive  results are indicative of active infection with SARS-CoV-2.  Clinical  correlation with patient history and other diagnostic information is  necessary to determine patient infection status.  Positive results do  not rule out bacterial infection or co-infection with other viruses. If result is PRESUMPTIVE POSTIVE SARS-CoV-2 nucleic acids MAY BE PRESENT.   A presumptive positive result was obtained on the submitted specimen  and confirmed on repeat testing.  While 2019 novel coronavirus  (SARS-CoV-2) nucleic acids may be present in the submitted sample   additional confirmatory testing may be necessary for epidemiological  and / or clinical management purposes  to differentiate between  SARS-CoV-2 and other Sarbecovirus currently known to infect humans.  If clinically indicated additional testing with an alternate test  methodology [phone]) is advised. The SARS-CoV-2 RNA is generally  detectable in upper and lower respiratory sp ecimens during the acute  phase of infection. The expected result is Negative. Fact Sheet for Patients:  StrictlyIdeas.no Fact Sheet for Healthcare Providers: BankingDealers.co.za This test is not yet approved or cleared by the [place] FDA and has been authorized for detection and/or diagnosis of SARS-CoV-2 by FDA under an Emergency Use Authorization (EUA).  This EUA will remain in effect (meaning this test can be used) for the duration of the COVID-19 declaration under Section 564(b)(1) of the Act, 21 U.S.C. section 360bbb-3(b)(1), unless the authorization is terminated or revoked sooner. Performed at [facility], [place] [address], [place], [place] [address]   Urinalysis, Routine w reflex microscopic     Status: None   Collection Time: 09/20/18  7:23 PM  Result Value Ref Range   Color, Urine YELLOW YELLOW   APPearance CLEAR CLEAR   Specific Gravity, Urine 1.012 1.005 - 1.030   pH 5.0 5.0 - 8.0   Glucose, UA NEGATIVE NEGATIVE mg/dL   Hgb urine dipstick NEGATIVE NEGATIVE   Bilirubin Urine NEGATIVE NEGATIVE   Ketones, ur NEGATIVE NEGATIVE mg/dL   Protein, ur NEGATIVE NEGATIVE mg/dL   Nitrite NEGATIVE NEGATIVE   Leukocytes,Ua NEGATIVE NEGATIVE    Comment: Performed at [place] [address] [place], [place] [address]    Chemistries  Recent Labs  Lab 09/20/18 1652  NA 140  K 4.2  CL 104  CO2 27  GLUCOSE 144*  BUN 15  CREATININE 0.83  CALCIUM 9.0  AST 19  ALT 17  ALKPHOS 64  BILITOT 0.2*    ------------------------------------------------------------------------------------------------------------------  ------------------------------------------------------------------------------------------------------------------ GFR: Estimated Creatinine Clearance: 38 mL/min (by C-G formula based on SCr of 0.83 mg/dL). Liver Function Tests: Recent Labs  Lab 09/20/18 1652  AST 19  ALT 17  ALKPHOS 64  BILITOT 0.2*  PROT 6.5  ALBUMIN 3.3*   No results for input(s): LIPASE, AMYLASE in the last 168 hours. Recent Labs  Lab 09/20/18 1749  AMMONIA 35   Coagulation Profile: No results for input(s): INR, PROTIME in the last 168 hours. Cardiac Enzymes: No results for input(s): CKTOTAL, CKMB, CKMBINDEX, TROPONINI in the last 168 hours. BNP (last 3 results) No results for input(s): PROBNP in the last 8760 hours. HbA1C: No results for input(s): HGBA1C in the  last 72 hours. CBG: Recent Labs  Lab 09/20/18 1712  GLUCAP 116*   Lipid Profile: No results for input(s): CHOL, HDL, LDLCALC, TRIG, CHOLHDL, LDLDIRECT in the last 72 hours. Thyroid Function Tests: No results for input(s): TSH, T4TOTAL, FREET4, T3FREE, THYROIDAB in the last 72 hours. Anemia Panel: No results for input(s): VITAMINB12, FOLATE, FERRITIN, TIBC, IRON, RETICCTPCT in the last 72 hours.  --------------------------------------------------------------------------------------------------------------- Urine analysis:    Component Value Date/Time   COLORURINE YELLOW 09/20/2018 1923   APPEARANCEUR CLEAR 09/20/2018 1923   LABSPEC 1.012 09/20/2018 1923   PHURINE 5.0 09/20/2018 1923   GLUCOSEU NEGATIVE 09/20/2018 1923   HGBUR NEGATIVE 09/20/2018 1923   BILIRUBINUR NEGATIVE 09/20/2018 1923   BILIRUBINUR Neg 05/04/2017 [place] 09/20/2018 1923   PROTEINUR NEGATIVE 09/20/2018 1923   UROBILINOGEN 0.2 05/04/2017 1647   UROBILINOGEN 0.2 03/10/2014 0931   NITRITE NEGATIVE 09/20/2018 1923   LEUKOCYTESUR  NEGATIVE 09/20/2018 1923      Imaging Results:    Ct Head Wo Contrast  Result Date: 09/20/2018 CLINICAL DATA:  85 year old female with altered mental status. EXAM: CT HEAD WITHOUT CONTRAST TECHNIQUE: Contiguous axial images were obtained from the base of the skull through the vertex without intravenous contrast. COMPARISON:  03/08/2018 CT and prior exams FINDINGS: Brain: No evidence of acute infarction, hemorrhage, hydrocephalus, extra-axial collection or mass lesion/mass effect. Atrophy, chronic small-vessel white matter ischemic changes and RIGHT frontal encephalomalacia again noted. Vascular: No hyperdense vessel or unexpected calcification. Skull: Normal. Negative for fracture or focal lesion. Sinuses/Orbits: No acute finding. Other: None. IMPRESSION: 1. No evidence of acute intracranial abnormality. 2. Atrophy and chronic small-vessel white matter ischemic changes. Electronically Signed   By: Margarette Canada M.D.   On: 09/20/2018 19:23   Dg Chest Portable 1 View  Result Date: 09/20/2018 CLINICAL DATA:  Altered level of consciousness. EXAM: PORTABLE CHEST 1 VIEW COMPARISON:  03/08/2018 FINDINGS: There appears to be a new retrocardiac opacity. The heart size is enlarged but stable from prior study. There are persistent aortic calcifications. There is no acute osseous abnormality. There are advanced degenerative changes of the right glenohumeral joint. There is some stable elevation of the right hemidiaphragm. IMPRESSION: Apparent new retrocardiac opacity concerning for developing pneumonia or aspiration in the appropriate clinical setting. Electronically Signed   By: Constance Holster M.D.   On: 09/20/2018 18:26       Assessment & Plan:    Principal Problem:   CAP (community acquired pneumonia) Active Problems:   Dementia ([place])   HLD (hyperlipidemia)   Essential hypertension  CAP Blood culture x2 Urine legionella antigen Urine strep antigen Rocephin 1gm iv qday, zithromax 536m iv  qday  AMS likely secondary to CAP Consider MRI brain r/o CVA if not improving Check b12, esr, ana, rpr, tsh  Anxiety Xanax prn  Celexa 236mpo qday  Hypertension  Cont Toprol XL 5059mo qday  Hyperlipidemia Cont Lipitor 84m14m qhs  H/o CVA (1cm acute infarct left corona radiata) 03/08/2017  Cont Aspirin  Cont Lipitor    DVT Prophylaxis-   Lovenox - SCDs   AM Labs Ordered, also please review Full Orders  Family Communication: Admission, patients condition and plan of care including tests being ordered have been discussed with the patient and daughter who indicate understanding and agree with the plan and Code Status.  Code Status:  DNR,  Daughter present with patient  Admission status: Observation/: Based on patients clinical presentation and evaluation of above clinical data, I have made determination  that patient meets observation criteria at this time.  Pt will require iv abx for CAP, and confusion, pt will be admitted observation, but if mental status is not improving may require inpatient admission   Time spent in minutes : 70   Jani Gravel M.D on 09/20/2018 at 11:01 PM

## 2018-09-20 NOTE — ED Notes (Addendum)
Pt caregiver informed of visitor policy, stating that she refuses to leave pt here without her that the patient will not be able to tolerate being alone and we will end up with a lawsuit. AC and 4th floor made aware and exception for visitor to stay past hours approved.

## 2018-09-20 NOTE — ED Notes (Signed)
Pt brought upstairs, family member states she has an injury and can walk short distances but may need assistance upstairs as well. This nurse and float nurse assisted patient and family member to the floor.

## 2018-09-20 NOTE — ED Notes (Signed)
Pt repositioned in bed and given a sandwich and drink.

## 2018-09-20 NOTE — ED Notes (Signed)
Patient transported to CT 

## 2018-09-20 NOTE — ED Notes (Addendum)
Patient transported to X-ray 

## 2018-09-21 DIAGNOSIS — J181 Lobar pneumonia, unspecified organism: Secondary | ICD-10-CM

## 2018-09-21 LAB — CBC
HCT: 40.3 % (ref 36.0–46.0)
Hemoglobin: 12.7 g/dL (ref 12.0–15.0)
MCH: 30.8 pg (ref 26.0–34.0)
MCHC: 31.5 g/dL (ref 30.0–36.0)
MCV: 97.6 fL (ref 80.0–100.0)
Platelets: 187 10*3/uL (ref 150–400)
RBC: 4.13 MIL/uL (ref 3.87–5.11)
RDW: 14.1 % (ref 11.5–15.5)
WBC: 5.4 10*3/uL (ref 4.0–10.5)
nRBC: 0 % (ref 0.0–0.2)

## 2018-09-21 LAB — COMPREHENSIVE METABOLIC PANEL
ALT: 16 U/L (ref 0–44)
AST: 19 U/L (ref 15–41)
Albumin: 3.5 g/dL (ref 3.5–5.0)
Alkaline Phosphatase: 63 U/L (ref 38–126)
Anion gap: 9 (ref 5–15)
BUN: 12 mg/dL (ref 8–23)
CO2: 26 mmol/L (ref 22–32)
Calcium: 9.2 mg/dL (ref 8.9–10.3)
Chloride: 108 mmol/L (ref 98–111)
Creatinine, Ser: 0.71 mg/dL (ref 0.44–1.00)
GFR calc Af Amer: >60 mL/min (ref >60)
GFR calc non Af Amer: >60 mL/min (ref >60)
Glucose, Bld: 121 mg/dL — ABNORMAL HIGH (ref 70–99)
Potassium: 4.2 mmol/L (ref 3.5–5.1)
Sodium: 143 mmol/L (ref 135–145)
Total Bilirubin: 0.4 mg/dL (ref 0.3–1.2)
Total Protein: 6.8 g/dL (ref 6.5–8.1)

## 2018-09-21 LAB — STREP PNEUMONIAE URINARY ANTIGEN: Strep Pneumo Urinary Antigen: NEGATIVE

## 2018-09-21 LAB — PROCALCITONIN: Procalcitonin: <0.1 ng/mL

## 2018-09-21 LAB — MAGNESIUM: Magnesium: 2.2 mg/dL (ref 1.7–2.4)

## 2018-09-21 MED ORDER — TRAZODONE HCL 100 MG PO TABS
200.0000 mg | ORAL_TABLET | Freq: Every day | ORAL | Status: DC
  Administered 2018-09-21: 200 mg via ORAL
  Filled 2018-09-21: qty 2

## 2018-09-21 MED ORDER — PANTOPRAZOLE SODIUM 40 MG PO TBEC
40.0000 mg | DELAYED_RELEASE_TABLET | Freq: Every day | ORAL | Status: DC
  Administered 2018-09-21: 40 mg via ORAL
  Filled 2018-09-21: qty 1

## 2018-09-21 MED ORDER — CITALOPRAM HYDROBROMIDE 20 MG PO TABS
20.0000 mg | ORAL_TABLET | Freq: Every day | ORAL | Status: DC
  Administered 2018-09-21: 10:00:00 20 mg via ORAL
  Filled 2018-09-21: qty 1

## 2018-09-21 MED ORDER — AMOXICILLIN-POT CLAVULANATE 875-125 MG PO TABS: 1.0000 | ORAL_TABLET | Freq: Two times a day (BID) | ORAL | 0 refills | Status: AC

## 2018-09-21 MED ORDER — ATORVASTATIN CALCIUM 20 MG PO TABS: 20.0000 mg | ORAL_TABLET | Freq: Every day | ORAL | Status: DC

## 2018-09-21 MED ORDER — METOPROLOL SUCCINATE ER 50 MG PO TB24: 50.0000 mg | ORAL_TABLET | Freq: Every day | ORAL | Status: DC

## 2018-09-21 MED ORDER — TRIMETHOPRIM 100 MG PO TABS
100.0000 mg | ORAL_TABLET | Freq: Every day | ORAL | Status: DC
  Administered 2018-09-21: 100 mg via ORAL
  Filled 2018-09-21 (×2): qty 1

## 2018-09-21 MED ORDER — METOPROLOL SUCCINATE ER 50 MG PO TB24
50.0000 mg | ORAL_TABLET | Freq: Every day | ORAL | Status: DC
  Administered 2018-09-21: 50 mg via ORAL
  Filled 2018-09-21: qty 1

## 2018-09-21 MED ORDER — ASPIRIN 325 MG PO TABS
325.0000 mg | ORAL_TABLET | Freq: Every day | ORAL | Status: DC
  Administered 2018-09-21: 325 mg via ORAL
  Filled 2018-09-21: qty 1

## 2018-09-21 MED ORDER — ALPRAZOLAM 0.5 MG PO TABS: 0.5000 mg | ORAL_TABLET | Freq: Two times a day (BID) | ORAL | Status: DC | PRN

## 2018-09-21 NOTE — Discharge Summary (Addendum)
Physician Discharge Summary  Bonnie Mora:[id number] DOB: Apr 17, 1926 DOA: 09/20/2018  PCP: Harlan Stains, MD  Admit date: 09/20/2018 Discharge date: 09/21/2018  Time spent: 40 minutes  Recommendations for Outpatient Follow-up:  1. Follow outpatient CBC/CMP 2. Follow up CXR outpatient    Discharge Diagnoses:  Principal Problem:   CAP (community acquired pneumonia) Active Problems:   Dementia ([place])   HLD (hyperlipidemia)   Essential hypertension   Discharge Condition: stable  Diet recommendation: heart healthy  Filed Weights   09/20/18 2200  Weight: 65.5 kg    History of present illness:    Bonnie Mora  is a 85 y.o. female,  w hypertension, hyperlipidemia, Parkinsons, Dementia, apparently presents with hx of cough, tx with abx (most recently augmentin), without benefit. Pt has dry cough for the past several weeks.  Pt for the past 2 day has had altered mental status per family.  No fever, no chills, no cp, no palp, no sob, no n/v, no diarrhea, no dysuria, no hematuria.   In ED,  T 97.9  P 55, R 18, Bp 144/81  Pox 97% on RA  CT brain IMPRESSION: 1. No evidence of acute intracranial abnormality. 2. Atrophy and chronic small-vessel white matter ischemic changes.  CXR IMPRESSION: Apparent new retrocardiac opacity concerning for developing pneumonia or aspiration in the appropriate clinical setting.  na 140, K 4.2, Bun 15, Creatinine 0.83 Alb 3.3 Ast 19, Alt 17, Alk phos 64, T. Bili 0.2 Wbc 5.5, Hgb 12.2, Plt 217 Lactic acid 1.5  Abg ph 7.370, pCo2 53.7, Po2 36 (venous ?)  Ammonia 35  covid -19 negative.   Pt will be admitted for CAP  She was admitted for CAP and delirium.  She was improved on hospital day 1 and discharged with oral abx.  Discussed with pt caregiver who communicated with pt son (I was unable to reach son on phone).  Hospital Course:  CAP Blood culture x2  Urine legionella antigen (pending) Urine strep antigen  (negative) Rocephin 1gm iv qday, zithromax 578m iv qday Discharged on augmentin  AMS likely secondary to CAP Consider MRI brain r/o CVA if not improving Seems improved Negative  Anxiety Xanax prn  Celexa 26mpo qday  Hypertension  Cont Toprol XL 5069mo qday  Hyperlipidemia Cont Lipitor 17m49m qhs  H/o CVA (1cm acute infarct left corona radiata) 03/08/2017  Cont Aspirin  Cont Lipitor   Procedures:  none  Consultations:  none  Discharge Exam: Vitals:   09/21/18 0948 09/21/18 1039  BP: (!) 180/90 (!) 181/71  Pulse: 72 68  Resp: 18   Temp: (!) 96.8 F (36 C)   SpO2: 95%    Sleeping on time of eval Caregiver at bedside, notes that she feels she's at baseline Attempted to call son, but no answer.  Caregiver said she spoke to son who is aware.  General: No acute distress. Cardiovascular: Heart sounds show a regular rate, and rhythm. Lungs: Clear to auscultation bilaterally Abdomen: Soft, nontender, nondistended  Neurological: sleepy. Moves all extremities 4. Cranial nerves II through XII grossly intact. Skin: Warm and dry. No rashes or lesions. Extremities: No clubbing or cyanosis. No edema.   Discharge Instructions   Discharge Instructions    Call MD for:  difficulty breathing, headache or visual disturbances   Complete by: As directed    Call MD for:  extreme fatigue   Complete by: As directed    Call MD for:  persistant dizziness or light-headedness   Complete by: As  directed    Call MD for:  persistant nausea and vomiting   Complete by: As directed    Call MD for:  redness, tenderness, or signs of infection (pain, swelling, redness, odor or green/yellow discharge around incision site)   Complete by: As directed    Call MD for:  severe uncontrolled pain   Complete by: As directed    Call MD for:  temperature >100.4   Complete by: As directed    Diet - low sodium heart healthy   Complete by: As directed    Discharge instructions   Complete  by: As directed    You were seen for pneumonia.   You've improved with antibiotics.  We'll send you home with a plan to continue antibiotics.  Follow up a chest x ray with your PCP in a few weeks.  Return for new, recurrent, or worsening symptoms.  Please ask your PCP to request records from this hospitalization so they know what was done and what the next steps will be.   Increase activity slowly   Complete by: As directed      Allergies as of 09/21/2018      Reactions   Codeine Nausea And Vomiting      Medication List    TAKE these medications   acetaminophen 325 MG tablet Commonly known as: TYLENOL Take 2 tablets (650 mg total) by mouth every 6 (six) hours as needed for mild pain (or Fever >/= 101).   ALPRAZolam 0.5 MG tablet Commonly known as: Xanax Take 1 tablet (0.5 mg total) by mouth 2 (two) times daily as needed for anxiety.   amoxicillin-clavulanate 875-125 MG tablet Commonly known as: Augmentin Take 1 tablet by mouth 2 (two) times daily for 5 days.   aspirin 325 MG tablet Take 1 tablet (325 mg total) by mouth daily.   atorvastatin 20 MG tablet Commonly known as: LIPITOR TAKE 1 TABLET AT 6PM FOR CHOLESTEROL.   citalopram 20 MG tablet Commonly known as: CELEXA Take 1 tablet (20 mg total) by mouth daily.   metoprolol succinate 50 MG 24 hr tablet Commonly known as: TOPROL-XL Take 1 tablet (50 mg total) by mouth daily with breakfast.   nystatin cream Commonly known as: MYCOSTATIN Apply to affected area 2 times daily   omeprazole 20 MG capsule Commonly known as: PRILOSEC Take 20 mg by mouth daily.   polyethylene glycol 17 g packet Commonly known as: MIRALAX / GLYCOLAX Take 17 g by mouth daily as needed for mild constipation.   traZODone 100 MG tablet Commonly known as: DESYREL Take 1 tablet (100 mg total) by mouth at bedtime. What changed: how much to take   trimethoprim 100 MG tablet Commonly known as: TRIMPEX Take 100 mg by mouth at  bedtime.      Allergies  Allergen Reactions  . Codeine Nausea And Vomiting      The results of significant diagnostics from this hospitalization (including imaging, microbiology, ancillary and laboratory) are listed below for reference.    Significant Diagnostic Studies: Ct Head Wo Contrast  Result Date: 09/20/2018 CLINICAL DATA:  85 year old female with altered mental status. EXAM: CT HEAD WITHOUT CONTRAST TECHNIQUE: Contiguous axial images were obtained from the base of the skull through the vertex without intravenous contrast. COMPARISON:  03/08/2018 CT and prior exams FINDINGS: Brain: No evidence of acute infarction, hemorrhage, hydrocephalus, extra-axial collection or mass lesion/mass effect. Atrophy, chronic small-vessel white matter ischemic changes and RIGHT frontal encephalomalacia again noted. Vascular: No hyperdense vessel or unexpected  calcification. Skull: Normal. Negative for fracture or focal lesion. Sinuses/Orbits: No acute finding. Other: None. IMPRESSION: 1. No evidence of acute intracranial abnormality. 2. Atrophy and chronic small-vessel white matter ischemic changes. Electronically Signed   By: Margarette Canada M.D.   On: 09/20/2018 19:23   Dg Chest Portable 1 View  Result Date: 09/20/2018 CLINICAL DATA:  Altered level of consciousness. EXAM: PORTABLE CHEST 1 VIEW COMPARISON:  03/08/2018 FINDINGS: There appears to be a new retrocardiac opacity. The heart size is enlarged but stable from prior study. There are persistent aortic calcifications. There is no acute osseous abnormality. There are advanced degenerative changes of the right glenohumeral joint. There is some stable elevation of the right hemidiaphragm. IMPRESSION: Apparent new retrocardiac opacity concerning for developing pneumonia or aspiration in the appropriate clinical setting. Electronically Signed   By: Constance Holster M.D.   On: 09/20/2018 18:26    Microbiology: Recent Results (from the past 240 hour(s))   SARS Coronavirus 2 [facility] order, Performed in [facility] hospital lab)     Status: None   Collection Time: 09/20/18  5:49 PM  Result Value Ref Range Status   SARS Coronavirus 2 NEGATIVE NEGATIVE Final    Comment: (NOTE) If result is NEGATIVE SARS-CoV-2 target nucleic acids are NOT DETECTED. The SARS-CoV-2 RNA is generally detectable in upper and lower  respiratory specimens during the acute phase of infection. The lowest  concentration of SARS-CoV-2 viral copies this assay can detect is 250  copies / mL. A negative result does not preclude SARS-CoV-2 infection  and should not be used as the sole basis for treatment or other  patient management decisions.  A negative result may occur with  improper specimen collection / handling, submission of specimen other  than nasopharyngeal swab, presence of viral mutation(s) within the  areas targeted by this assay, and inadequate number of viral copies  (<250 copies / mL). A negative result must be combined with clinical  observations, patient history, and epidemiological information. If result is POSITIVE SARS-CoV-2 target nucleic acids are DETECTED. The SARS-CoV-2 RNA is generally detectable in upper and lower  respiratory specimens dur ing the acute phase of infection.  Positive  results are indicative of active infection with SARS-CoV-2.  Clinical  correlation with patient history and other diagnostic information is  necessary to determine patient infection status.  Positive results do  not rule out bacterial infection or co-infection with other viruses. If result is PRESUMPTIVE POSTIVE SARS-CoV-2 nucleic acids MAY BE PRESENT.   A presumptive positive result was obtained on the submitted specimen  and confirmed on repeat testing.  While 2019 novel coronavirus  (SARS-CoV-2) nucleic acids may be present in the submitted sample  additional confirmatory testing may be necessary for epidemiological  and / or clinical management purposes   to differentiate between  SARS-CoV-2 and other Sarbecovirus currently known to infect humans.  If clinically indicated additional testing with an alternate test  methodology [phone]) is advised. The SARS-CoV-2 RNA is generally  detectable in upper and lower respiratory sp ecimens during the acute  phase of infection. The expected result is Negative. Fact Sheet for Patients:  StrictlyIdeas.no Fact Sheet for Healthcare Providers: BankingDealers.co.za This test is not yet approved or cleared by the [place] FDA and has been authorized for detection and/or diagnosis of SARS-CoV-2 by FDA under an Emergency Use Authorization (EUA).  This EUA will remain in effect (meaning this test can be used) for the duration of the COVID-19 declaration under Section 564(b)(1) of  the Act, 21 U.S.C. section 360bbb-3(b)(1), unless the authorization is terminated or revoked sooner. Performed at [facility], [place] [address] [place], [place] [address]      Labs: Basic Metabolic Panel: Recent Labs  Lab 09/20/18 1652 09/21/18 1018  NA 140 143  K 4.2 4.2  CL 104 108  CO2 27 26  GLUCOSE 144* 121*  BUN 15 12  CREATININE 0.83 0.71  CALCIUM 9.0 9.2  MG  --  2.2   Liver Function Tests: Recent Labs  Lab 09/20/18 1652 09/21/18 1018  AST 19 19  ALT 17 16  ALKPHOS 64 63  BILITOT 0.2* 0.4  PROT 6.5 6.8  ALBUMIN 3.3* 3.5   No results for input(s): LIPASE, AMYLASE in the last 168 hours. Recent Labs  Lab 09/20/18 1749  AMMONIA 35   CBC: Recent Labs  Lab 09/20/18 1652 09/21/18 1018  WBC 5.5 5.4  HGB 12.2 12.7  HCT 38.4 40.3  MCV 97.0 97.6  PLT 217 187   Cardiac Enzymes: No results for input(s): CKTOTAL, CKMB, CKMBINDEX, TROPONINI in the last 168 hours. BNP: BNP (last 3 results) No results for input(s): BNP in the last 8760 hours.  ProBNP (last 3 results) No results for input(s): PROBNP in the last 8760  hours.  CBG: Recent Labs  Lab 09/20/18 1712  GLUCAP 116*       Signed:  Fayrene Helper MD.  [facility] 09/21/2018, 12:11 PM

## 2018-09-21 NOTE — TOC Transition Note (Addendum)
Transition of Care [facility]) - CM/SW Discharge Note   Patient Details  Name: JEZREEL SISK MRN: [id number] Date of Birth: [birth year]  [facility] CM/SW Contact:  Wende Neighbors, LCSW Phone Number: 09/21/2018, 2:39 PM   Clinical Narrative:   CSW received verbal consult to assist patient in having [facility] nursing follow in the home. [facility] stated they will be able to follow patient in the home for nursing needs but will not be able to have a nurse until next week. CSW will make caregiver aware. MD aware.          Patient Goals and CMS Choice        Discharge Placement                       Discharge Plan and Services                                     Social Determinants of Health (SDOH) Interventions     Readmission Risk Interventions No flowsheet data found.

## 2018-09-22 LAB — LEGIONELLA PNEUMOPHILA SEROGP 1 UR AG: L. pneumophila Serogp 1 Ur Ag: NEGATIVE

## 2018-09-22 LAB — HIV ANTIBODY (ROUTINE TESTING W REFLEX): HIV Screen 4th Generation wRfx: NONREACTIVE

## 2018-10-25 ENCOUNTER — Other Ambulatory Visit: Payer: Self-pay

## 2018-10-25 ENCOUNTER — Emergency Department (HOSPITAL_COMMUNITY): Payer: Medicare Other

## 2018-10-25 ENCOUNTER — Emergency Department (HOSPITAL_COMMUNITY)
Admission: EM | Admit: 2018-10-25 | Discharge: 2018-10-25 | Disposition: A | Discharge status: Home / Self Care | Payer: Medicare Other | Attending: Emergency Medicine | Admitting: Emergency Medicine

## 2018-10-25 DIAGNOSIS — R05 Cough: Secondary | ICD-10-CM | POA: Insufficient documentation

## 2018-10-25 DIAGNOSIS — I10 Essential (primary) hypertension: Secondary | ICD-10-CM | POA: Insufficient documentation

## 2018-10-25 DIAGNOSIS — Z20828 Contact with and (suspected) exposure to other viral communicable diseases: Secondary | ICD-10-CM | POA: Insufficient documentation

## 2018-10-25 DIAGNOSIS — Z96653 Presence of artificial knee joint, bilateral: Secondary | ICD-10-CM | POA: Insufficient documentation

## 2018-10-25 DIAGNOSIS — G20 Parkinson's disease: Secondary | ICD-10-CM | POA: Diagnosis not present

## 2018-10-25 DIAGNOSIS — F0280 Dementia in other diseases classified elsewhere without behavioral disturbance: Secondary | ICD-10-CM | POA: Insufficient documentation

## 2018-10-25 DIAGNOSIS — Z7982 Long term (current) use of aspirin: Secondary | ICD-10-CM | POA: Insufficient documentation

## 2018-10-25 DIAGNOSIS — Z79899 Other long term (current) drug therapy: Secondary | ICD-10-CM | POA: Insufficient documentation

## 2018-10-25 DIAGNOSIS — R059 Cough, unspecified: Secondary | ICD-10-CM

## 2018-10-25 LAB — CBC WITH DIFFERENTIAL/PLATELET
Abs Immature Granulocytes: 0.02 10*3/uL (ref 0.00–0.07)
Basophils Absolute: 0 10*3/uL (ref 0.0–0.1)
Basophils Relative: 1 %
Eosinophils Absolute: 0.1 10*3/uL (ref 0.0–0.5)
Eosinophils Relative: 1 %
HCT: 43.4 % (ref 36.0–46.0)
Hemoglobin: 13.6 g/dL (ref 12.0–15.0)
Immature Granulocytes: 0 %
Lymphocytes Relative: 34 %
Lymphs Abs: 2.7 10*3/uL (ref 0.7–4.0)
MCH: 31.8 pg (ref 26.0–34.0)
MCHC: 31.3 g/dL (ref 30.0–36.0)
MCV: 101.4 fL — ABNORMAL HIGH (ref 80.0–100.0)
Monocytes Absolute: 1 10*3/uL (ref 0.1–1.0)
Monocytes Relative: 13 %
Neutro Abs: 4.2 10*3/uL (ref 1.7–7.7)
Neutrophils Relative %: 51 %
Platelets: 182 10*3/uL (ref 150–400)
RBC: 4.28 MIL/uL (ref 3.87–5.11)
RDW: 14 % (ref 11.5–15.5)
WBC: 8 10*3/uL (ref 4.0–10.5)
nRBC: 0 % (ref 0.0–0.2)

## 2018-10-25 LAB — COMPREHENSIVE METABOLIC PANEL
ALT: 18 U/L (ref 0–44)
AST: 25 U/L (ref 15–41)
Albumin: 4 g/dL (ref 3.5–5.0)
Alkaline Phosphatase: 66 U/L (ref 38–126)
Anion gap: 5 (ref 5–15)
BUN: 33 mg/dL — ABNORMAL HIGH (ref 8–23)
CO2: 27 mmol/L (ref 22–32)
Calcium: 9.7 mg/dL (ref 8.9–10.3)
Chloride: 110 mmol/L (ref 98–111)
Creatinine, Ser: 0.98 mg/dL (ref 0.44–1.00)
GFR calc Af Amer: 59 mL/min — ABNORMAL LOW (ref >60)
GFR calc non Af Amer: 51 mL/min — ABNORMAL LOW (ref >60)
Glucose, Bld: 104 mg/dL — ABNORMAL HIGH (ref 70–99)
Potassium: 4.9 mmol/L (ref 3.5–5.1)
Sodium: 142 mmol/L (ref 135–145)
Total Bilirubin: 0.4 mg/dL (ref 0.3–1.2)
Total Protein: 7.6 g/dL (ref 6.5–8.1)

## 2018-10-25 LAB — BRAIN NATRIURETIC PEPTIDE: B Natriuretic Peptide: 83.7 pg/mL (ref 0.0–100.0)

## 2018-10-25 MED ORDER — AMLODIPINE BESYLATE 5 MG PO TABS
5.0000 mg | ORAL_TABLET | Freq: Once | ORAL | Status: AC
  Administered 2018-10-25: 5 mg via ORAL
  Filled 2018-10-25: qty 1

## 2018-10-25 NOTE — ED Notes (Signed)
Pure wick has been placed. Suction set to 45mmHg.  

## 2018-10-25 NOTE — ED Provider Notes (Addendum)
[facility]-EMERGENCY DEPT Provider Note   CSN: [id number] Arrival date & time: 10/25/18  1459     History   Chief Complaint Chief Complaint  Patient presents with  . Cough    HPI Bonnie Mora is a 85 y.o. female.     HPI Pt was in the hospital for pneumonia back in august.  [facility] and does not seem to be getting any better. She coughs a lot and seems to be getting weaker.  She is not having any fvers.  No vomiting.  She has had some loose stools.  She had an episode of low oxygen at home today so family decided to brin her back in the Past Medical History:  Diagnosis Date  . Dementia (HCC)   . Hyperlipidemia   . Hypertension   . Parkinson disease [facility](HCC)     Patient Active Problem List   Diagnosis Date Noted  . CAP (community acquired pneumonia) 09/20/2018  . UTI due to extended-spectrum beta lactamase (ESBL) producing Escherichia coli 04/13/2017  . Complicated UTI (urinary tract infection)   . Confusion   . Hx of completed stroke 03/08/2017  . Acute encephalopathy 03/06/2017  . Chronic left shoulder pain 08/19/2016  . Routine general medical examination at a health care facility 04/25/2016  . Dementia (HCC) 03/10/2014  . HLD (hyperlipidemia) 03/10/2014  . Essential hypertension 03/10/2014    Past Surgical History:  Procedure Laterality Date  . APPENDECTOMY    . JOINT REPLACEMENT     bilateral knee  . OVARIAN CYST SURGERY    . TONSILLECTOMY AND ADENOIDECTOMY       OB History   No obstetric history on file.      Home Medications    Prior to Admission medications   Medication Sig Start Date End Date Taking? Authorizing Provider  acetaminophen (TYLENOL) 325 MG tablet Take 2 tablets (650 mg total) by mouth every 6 (six) hours as needed for mild pain (or Fever >/= 101). 04/17/17  Yes Emokpae, Courage, MD  ALPRAZolam (XANAX) 0.5 MG tablet Take 1 tablet (0.5 mg total) by mouth 2 (two) times daily as needed for anxiety.  04/17/17  Yes Emokpae, Courage, MD  aspirin 325 MG tablet Take 1 tablet (325 mg total) by mouth daily. 03/10/17  Yes Glade LloydAlekh, Kshitiz, MD  atorvastatin (LIPITOR) 20 MG tablet TAKE 1 TABLET AT 6PM FOR CHOLESTEROL. Patient taking differently: Take 20 mg by mouth at bedtime.  07/20/17  Yes Myrlene Brokerrawford, Elizabeth A, MD  citalopram (CELEXA) 20 MG tablet Take 1 tablet (20 mg total) by mouth daily. 04/17/17  Yes Shon HaleEmokpae, Courage, MD  metoprolol succinate (TOPROL-XL) 50 MG 24 hr tablet Take 1 tablet (50 mg total) by mouth daily with breakfast. 04/17/17  Yes Emokpae, Courage, MD  nystatin cream (MYCOSTATIN) Apply to affected area 2 times daily Patient taking differently: 1 application 2 (two) times daily as needed for dry skin.  10/15/17  Yes Rice, Cicero DuckErika, MD  omeprazole (PRILOSEC) 20 MG capsule Take 20 mg by mouth daily.   Yes [provider]  polyethylene glycol (MIRALAX / GLYCOLAX) packet Take 17 g by mouth daily as needed for mild constipation. 03/10/17  Yes Glade LloydAlekh, Kshitiz, MD  traZODone (DESYREL) 100 MG tablet Take 1 tablet (100 mg total) by mouth at bedtime. Patient taking differently: Take 200 mg by mouth at bedtime.  04/25/16  Yes Myrlene Brokerrawford, Elizabeth A, MD  trimethoprim (TRIMPEX) 100 MG tablet Take 100 mg by mouth at bedtime.   Yes  [provider]    Family History Family History  Problem Relation Age of Onset  . ALS Mother   . Heart disease Father   . Heart disease Maternal Grandfather   . Cancer Maternal Grandfather        breast    Social History Social History   Tobacco Use  . Smoking status: Never Smoker  . Smokeless tobacco: Never Used  Substance Use Topics  . Alcohol use: No    Alcohol/week: 0.0 standard drinks  . Drug use: No     Allergies   Codeine   Review of Systems Review of Systems  All other systems reviewed and are negative.    Physical Exam Updated Vital Signs BP (!) 187/82   Pulse 71   Temp 98.6 F (37 C) (Rectal)   Resp 17   SpO2 95%    Physical Exam Vitals signs and nursing note reviewed.  Constitutional:      General: She is not in acute distress.    Appearance: She is well-developed.     Comments: Jovial  HENT:     Head: Normocephalic and atraumatic.     Right Ear: External ear normal.     Left Ear: External ear normal.  Eyes:     General: No scleral icterus.       Right eye: No discharge.        Left eye: No discharge.     Conjunctiva/sclera: Conjunctivae normal.  Neck:     Musculoskeletal: Neck supple.     Trachea: No tracheal deviation.  Cardiovascular:     Rate and Rhythm: Normal rate and regular rhythm.  Pulmonary:     Effort: Pulmonary effort is normal. No respiratory distress.     Breath sounds: Normal breath sounds. No stridor. No wheezing or rales.     Comments: No coughing noted during exam Abdominal:     General: Bowel sounds are normal. There is no distension.     Palpations: Abdomen is soft.     Tenderness: There is no abdominal tenderness. There is no guarding or rebound.  Musculoskeletal:        General: No tenderness.  Skin:    General: Skin is warm and dry.     Findings: No rash.  Neurological:     Mental Status: She is alert.     Cranial Nerves: No cranial nerve deficit (no facial droop, extraocular movements intact, no slurred speech).     Sensory: No sensory deficit.     Motor: No abnormal muscle tone or seizure activity.     Coordination: Coordination normal.      ED Treatments / Results  Labs (all labs ordered are listed, but only abnormal results are displayed) Labs Reviewed  CBC WITH DIFFERENTIAL/PLATELET - Abnormal; Notable for the following components:      Result Value   MCV 101.4 (*)    All other components within normal limits  COMPREHENSIVE METABOLIC PANEL - Abnormal; Notable for the following components:   Glucose, Bld 104 (*)    BUN 33 (*)    GFR calc non Af Amer 51 (*)    GFR calc Af Amer 59 (*)    All other components within normal limits  NOVEL  CORONAVIRUS, NAA (HOSP ORDER, SEND-OUT TO REF LAB; TAT 18-24 HRS)  BRAIN NATRIURETIC PEPTIDE    EKG None  Radiology Dg Chest 2 View  Result Date: 10/25/2018 CLINICAL DATA:  Cough for 2 weeks. EXAM: CHEST - 2 VIEW COMPARISON:  09/20/2018  and prior radiographs FINDINGS: Low volume film with mild cardiomegaly again noted. LEFT LOWER lung scarring again noted. There is no evidence of focal airspace disease, pulmonary edema, suspicious pulmonary nodule/mass, pleural effusion, or pneumothorax. No acute bony abnormalities are identified. Degenerative changes in the RIGHT shoulder again noted. IMPRESSION: No evidence of acute cardiopulmonary disease. Electronically Signed   By: Margarette Canada M.D.   On: 10/25/2018 17:10    Procedures Procedures (including critical care time)  Medications Ordered in ED Medications  amLODipine (NORVASC) tablet 5 mg (5 mg Oral Given 10/25/18 1620)     Initial Impression / Assessment and Plan / ED Course  I have reviewed the triage vital signs and the nursing notes.  Pertinent labs & imaging results that were available during my care of the patient were reviewed by me and considered in my medical decision making (see chart for details).   Patient presented to the ED for evaluation of persistent cough after recent diagnosis of pneumonia and hospitalization back in the end of August.  In the ED the patient's exam is reassuring.  She is not hypoxic.  She is not tachypneic.  She is hypertensive but I think that is unrelated.  Laboratory tests and chest x-ray are unremarkable.  I have not noticed any coughing while the patient has been in the ED.  I suspect she could have some residual irritation from her recent pneumonia diagnosis but at this time there is no evidence of persistent pneumonia.  I did recommend follow-up with her primary doctor to recheck her blood pressure.    Patient was tested for covid back on August 31 but family does request repeat testing which I think  is reasonable. Final Clinical Impressions(s) / ED Diagnoses   Final diagnoses:  Cough  Hypertension, unspecified type    ED Discharge Orders    None       Dorie Rank, MD 10/25/18 1906

## 2018-10-25 NOTE — Discharge Instructions (Addendum)
Continue your current medications.  The laboratory tests and chest x-ray are reassuring.  There is no evidence of persistent pneumonia.  Follow up with your primary care doctor to recheck on the blood pressure

## 2018-10-25 NOTE — ED Triage Notes (Signed)
Pt BIBA from home.    Per EMS- Pt dx with PNA 2 weeks ago. Since that time pt has had productive cough. FD reported O2 sats to be 89% on room air, 98% on 2L O2.  Pt hx dementia. Pt AOx1 at baseline. Pt ambulatory.

## 2018-10-26 LAB — NOVEL CORONAVIRUS, NAA (HOSP ORDER, SEND-OUT TO REF LAB; TAT 18-24 HRS): SARS-CoV-2, NAA: NOT DETECTED

## 2018-12-14 ENCOUNTER — Other Ambulatory Visit: Payer: Self-pay

## 2018-12-14 ENCOUNTER — Other Ambulatory Visit: Payer: Medicare Other | Admitting: Hospice

## 2018-12-14 DIAGNOSIS — F0281 Dementia in other diseases classified elsewhere with behavioral disturbance: Secondary | ICD-10-CM

## 2018-12-14 DIAGNOSIS — Z515 Encounter for palliative care: Secondary | ICD-10-CM

## 2018-12-14 DIAGNOSIS — F02818 Dementia in other diseases classified elsewhere, unspecified severity, with other behavioral disturbance: Secondary | ICD-10-CM

## 2018-12-14 NOTE — Progress Notes (Signed)
Therapist, nutritional Palliative Care Consult Note Telephone: [phone]  Fax: [phone]  PATIENT NAME: Bonnie Mora DOB: [birth year] MRN: [id number]  PRIMARY CARE PROVIDER:   Laurann Montana, MD  REFERRING PROVIDER:  Laurann Montana, MD [phone] Bonnie Mora [address] [place],  [place] [address]  RESPONSIBLE PARTY:  Bonnie Mora [phone]  Patient lives at home with sister Bonnie Mora [phone]     TELEHEALTH VISIT STATEMENT Due to the COVID-19 crisis, this visit was done via telephone from my office. It was initiated and consented to by this patient and/or family.  RECOMMENDATIONS/PLAN:   Advance Care Planning/Goals of Care: Telehealth Visit consisted of building trust and discussions on Palliative Medicine as specialized medical care for people living with serious illness, aimed at facilitating advance care plan, symptoms relief and establishing goals of care. Bonnie Mora affirmed patient is a DNR, form at home. A copy signed and uploaded in Epic today. Goals of care include to maximize quality of life and symptom management. Symptom management: Memory loss related to Dementia at baseline. Patient remains continent of bowel and bladder. Dementia FAST 6c requiring assistance/guidance to dress properly. Still ambulatory with rolling walker. Bonnie Mora provides care at home; patient compliant with her medications. Ongoing supportive care encouraged.  Follow up: Palliative care will continue to follow patient for goals of care clarification and symptom management  I spent 30 minutes providing this consultation from 3.pm to 3.30pm. More than 50% of the time in this consultation was spent on coordinating communication  HISTORY OF PRESENT ILLNESS:  Bonnie Mora is a 85 y.o. year old female with multiple medical problems including Alzheimer's dementia, recurrent UTIs, HLD, HTN, anxiety. Palliative Care was asked to help address goals of care  CODE STATUS: DNR   PPS: 50% HOSPICE ELIGIBILITY/DIAGNOSIS: TBD  PAST MEDICAL HISTORY:  Past Medical History:  Diagnosis Date  . Dementia (HCC)   . Hyperlipidemia   . Hypertension   . Parkinson disease (HCC)     SOCIAL HX:  Social History   Tobacco Use  . Smoking status: Never Smoker  . Smokeless tobacco: Never Used  Substance Use Topics  . Alcohol use: No    Alcohol/week: 0.0 standard drinks    ALLERGIES:  Allergies  Allergen Reactions  . Codeine Nausea And Vomiting     PERTINENT MEDICATIONS:  Outpatient Encounter Medications as of 12/14/2018  Medication Sig  . acetaminophen (TYLENOL) 325 MG tablet Take 2 tablets (650 mg total) by mouth every 6 (six) hours as needed for mild pain (or Fever >/= 101).  Marland Kitchen ALPRAZolam (XANAX) 0.5 MG tablet Take 1 tablet (0.5 mg total) by mouth 2 (two) times daily as needed for anxiety.  Marland Kitchen aspirin 325 MG tablet Take 1 tablet (325 mg total) by mouth daily.  Marland Kitchen atorvastatin (LIPITOR) 20 MG tablet TAKE 1 TABLET AT 6PM FOR CHOLESTEROL. (Patient taking differently: Take 20 mg by mouth at bedtime. )  . citalopram (CELEXA) 20 MG tablet Take 1 tablet (20 mg total) by mouth daily.  . metoprolol succinate (TOPROL-XL) 50 MG 24 hr tablet Take 1 tablet (50 mg total) by mouth daily with breakfast.  . nystatin cream (MYCOSTATIN) Apply to affected area 2 times daily (Patient taking differently: 1 application 2 (two) times daily as needed for dry skin. )  . omeprazole (PRILOSEC) 20 MG capsule Take 20 mg by mouth daily.  . polyethylene glycol (MIRALAX / GLYCOLAX) packet Take 17 g by mouth daily as needed for mild constipation.  Marland Kitchen  traZODone (DESYREL) 100 MG tablet Take 1 tablet (100 mg total) by mouth at bedtime. (Patient taking differently: Take 200 mg by mouth at bedtime. )  . trimethoprim (TRIMPEX) 100 MG tablet Take 100 mg by mouth at bedtime.   No facility-administered encounter medications on file as of 12/14/2018.     Teodoro Spray, NP

## 2018-12-23 ENCOUNTER — Emergency Department (HOSPITAL_BASED_OUTPATIENT_CLINIC_OR_DEPARTMENT_OTHER)
Admission: EM | Admit: 2018-12-23 | Discharge: 2018-12-23 | Disposition: A | Discharge status: Home / Self Care | Payer: Medicare Other | Attending: Emergency Medicine | Admitting: Emergency Medicine

## 2018-12-23 ENCOUNTER — Encounter (HOSPITAL_BASED_OUTPATIENT_CLINIC_OR_DEPARTMENT_OTHER): Payer: Self-pay | Admitting: *Deleted

## 2018-12-23 ENCOUNTER — Other Ambulatory Visit: Payer: Self-pay

## 2018-12-23 ENCOUNTER — Emergency Department (HOSPITAL_BASED_OUTPATIENT_CLINIC_OR_DEPARTMENT_OTHER): Payer: Medicare Other

## 2018-12-23 DIAGNOSIS — F0390 Unspecified dementia without behavioral disturbance: Secondary | ICD-10-CM | POA: Diagnosis not present

## 2018-12-23 DIAGNOSIS — S0081XA Abrasion of other part of head, initial encounter: Secondary | ICD-10-CM | POA: Diagnosis present

## 2018-12-23 DIAGNOSIS — Y999 Unspecified external cause status: Secondary | ICD-10-CM | POA: Insufficient documentation

## 2018-12-23 DIAGNOSIS — Z885 Allergy status to narcotic agent status: Secondary | ICD-10-CM | POA: Insufficient documentation

## 2018-12-23 DIAGNOSIS — Z79899 Other long term (current) drug therapy: Secondary | ICD-10-CM | POA: Diagnosis not present

## 2018-12-23 DIAGNOSIS — Y9389 Activity, other specified: Secondary | ICD-10-CM | POA: Diagnosis not present

## 2018-12-23 DIAGNOSIS — Y92008 Other place in unspecified non-institutional (private) residence as the place of occurrence of the external cause: Secondary | ICD-10-CM | POA: Insufficient documentation

## 2018-12-23 DIAGNOSIS — Z7982 Long term (current) use of aspirin: Secondary | ICD-10-CM | POA: Insufficient documentation

## 2018-12-23 DIAGNOSIS — I10 Essential (primary) hypertension: Secondary | ICD-10-CM | POA: Diagnosis not present

## 2018-12-23 DIAGNOSIS — G20 Parkinson's disease: Secondary | ICD-10-CM | POA: Diagnosis not present

## 2018-12-23 DIAGNOSIS — Z20828 Contact with and (suspected) exposure to other viral communicable diseases: Secondary | ICD-10-CM | POA: Insufficient documentation

## 2018-12-23 DIAGNOSIS — W01198A Fall on same level from slipping, tripping and stumbling with subsequent striking against other object, initial encounter: Secondary | ICD-10-CM | POA: Insufficient documentation

## 2018-12-23 DIAGNOSIS — S0990XA Unspecified injury of head, initial encounter: Secondary | ICD-10-CM

## 2018-12-23 DIAGNOSIS — W19XXXA Unspecified fall, initial encounter: Secondary | ICD-10-CM

## 2018-12-23 DIAGNOSIS — E785 Hyperlipidemia, unspecified: Secondary | ICD-10-CM | POA: Insufficient documentation

## 2018-12-23 LAB — CBC WITH DIFFERENTIAL/PLATELET
Abs Immature Granulocytes: 0.03 10*3/uL (ref 0.00–0.07)
Basophils Absolute: 0.1 10*3/uL (ref 0.0–0.1)
Basophils Relative: 1 %
Eosinophils Absolute: 0.1 10*3/uL (ref 0.0–0.5)
Eosinophils Relative: 2 %
HCT: 38.3 % (ref 36.0–46.0)
Hemoglobin: 12 g/dL (ref 12.0–15.0)
Immature Granulocytes: 0 %
Lymphocytes Relative: 34 %
Lymphs Abs: 2.5 10*3/uL (ref 0.7–4.0)
MCH: 31.5 pg (ref 26.0–34.0)
MCHC: 31.3 g/dL (ref 30.0–36.0)
MCV: 100.5 fL — ABNORMAL HIGH (ref 80.0–100.0)
Monocytes Absolute: 1.1 10*3/uL — ABNORMAL HIGH (ref 0.1–1.0)
Monocytes Relative: 14 %
Neutro Abs: 3.6 10*3/uL (ref 1.7–7.7)
Neutrophils Relative %: 49 %
Platelets: 186 10*3/uL (ref 150–400)
RBC: 3.81 MIL/uL — ABNORMAL LOW (ref 3.87–5.11)
RDW: 13.8 % (ref 11.5–15.5)
WBC: 7.4 10*3/uL (ref 4.0–10.5)
nRBC: 0 % (ref 0.0–0.2)

## 2018-12-23 LAB — COMPREHENSIVE METABOLIC PANEL
ALT: 13 U/L (ref 0–44)
AST: 21 U/L (ref 15–41)
Albumin: 3.4 g/dL — ABNORMAL LOW (ref 3.5–5.0)
Alkaline Phosphatase: 54 U/L (ref 38–126)
Anion gap: 10 (ref 5–15)
BUN: 24 mg/dL — ABNORMAL HIGH (ref 8–23)
CO2: 25 mmol/L (ref 22–32)
Calcium: 8.9 mg/dL (ref 8.9–10.3)
Chloride: 103 mmol/L (ref 98–111)
Creatinine, Ser: 1.18 mg/dL — ABNORMAL HIGH (ref 0.44–1.00)
GFR calc Af Amer: 47 mL/min — ABNORMAL LOW (ref >60)
GFR calc non Af Amer: 41 mL/min — ABNORMAL LOW (ref >60)
Glucose, Bld: 122 mg/dL — ABNORMAL HIGH (ref 70–99)
Potassium: 3.9 mmol/L (ref 3.5–5.1)
Sodium: 138 mmol/L (ref 135–145)
Total Bilirubin: 0.5 mg/dL (ref 0.3–1.2)
Total Protein: 6.5 g/dL (ref 6.5–8.1)

## 2018-12-23 LAB — TROPONIN I (HIGH SENSITIVITY): Troponin I (High Sensitivity): 11 ng/L (ref <18)

## 2018-12-23 LAB — LIPASE, BLOOD: Lipase: 36 U/L (ref 11–51)

## 2018-12-23 LAB — URINALYSIS, ROUTINE W REFLEX MICROSCOPIC
Bilirubin Urine: NEGATIVE
Glucose, UA: NEGATIVE mg/dL
Hgb urine dipstick: NEGATIVE
Ketones, ur: NEGATIVE mg/dL
Leukocytes,Ua: NEGATIVE
Nitrite: NEGATIVE
Protein, ur: NEGATIVE mg/dL
Specific Gravity, Urine: >1.03 — ABNORMAL HIGH (ref 1.005–1.030)
pH: 6 (ref 5.0–8.0)

## 2018-12-23 LAB — AMMONIA: Ammonia: 24 umol/L (ref 9–35)

## 2018-12-23 MED ORDER — SODIUM CHLORIDE 0.9 % IV BOLUS
1000.0000 mL | Freq: Once | INTRAVENOUS | Status: AC
  Administered 2018-12-23: 1000 mL via INTRAVENOUS

## 2018-12-23 NOTE — ED Provider Notes (Signed)
[facility] EMERGENCY DEPARTMENT Provider Note   CSN: [id number] Arrival date & time: 12/23/18  1737     History   Chief Complaint Chief Complaint  Patient presents with  . Fall    HPI Bonnie Mora is a 85 y.o. female.     Level 5 caveat for dementia.  Patient with a history of Parkinson's disease, dementia, frequent UTIs presenting here after a fall.  Her caregiver states she went to sit on the couch when she fell striking her head on a brick hearth.  She landed hard on her low back.  She did not lose consciousness.  She takes aspirin no other anticoagulation.  She sustained a wound to the left side of her head and was told to come in by her PCP for a CT.  Her caregiver is also concerned that she has a UTI as she has been more confused over the past several days.  No fever or vomiting.  She states Macrobid for UTI prophylaxis.  She has been somewhat more confused than usual.  Denies any cough, fever, vomiting.  No chest pain or shortness of breath.  The history is provided by the patient.  Fall    Past Medical History:  Diagnosis Date  . Dementia ([facility])   . Hyperlipidemia   . Hypertension   . Parkinson disease [facility])     Patient Active Problem List   Diagnosis Date Noted  . CAP (community acquired pneumonia) 09/20/2018  . UTI due to extended-spectrum beta lactamase (ESBL) producing Escherichia coli 04/13/2017  . Complicated UTI (urinary tract infection)   . Confusion   . Hx of completed stroke 03/08/2017  . Acute encephalopathy 03/06/2017  . Chronic left shoulder pain 08/19/2016  . Routine general medical examination at a health care facility 04/25/2016  . Dementia ([facility]) 03/10/2014  . HLD (hyperlipidemia) 03/10/2014  . Essential hypertension 03/10/2014    Past Surgical History:  Procedure Laterality Date  . APPENDECTOMY    . JOINT REPLACEMENT     bilateral knee  . OVARIAN CYST SURGERY    . TONSILLECTOMY AND ADENOIDECTOMY       OB History   No  obstetric history on file.      Home Medications    Prior to Admission medications   Medication Sig Start Date End Date Taking? Authorizing Provider  acetaminophen (TYLENOL) 325 MG tablet Take 2 tablets (650 mg total) by mouth every 6 (six) hours as needed for mild pain (or Fever >/= 101). 04/17/17   Emokpae, Courage, MD  ALPRAZolam (XANAX) 0.5 MG tablet Take 1 tablet (0.5 mg total) by mouth 2 (two) times daily as needed for anxiety. 04/17/17   Roxan Hockey, MD  aspirin 325 MG tablet Take 1 tablet (325 mg total) by mouth daily. 03/10/17   Aline August, MD  atorvastatin (LIPITOR) 20 MG tablet TAKE 1 TABLET AT 6PM FOR CHOLESTEROL. Patient taking differently: Take 20 mg by mouth at bedtime.  07/20/17   Hoyt Koch, MD  citalopram (CELEXA) 20 MG tablet Take 1 tablet (20 mg total) by mouth daily. 04/17/17   Roxan Hockey, MD  metoprolol succinate (TOPROL-XL) 50 MG 24 hr tablet Take 1 tablet (50 mg total) by mouth daily with breakfast. 04/17/17   Roxan Hockey, MD  nystatin cream (MYCOSTATIN) Apply to affected area 2 times daily Patient taking differently: 1 application 2 (two) times daily as needed for dry skin.  10/15/17   Fabian November, MD  omeprazole (PRILOSEC) 20 MG capsule Take  20 mg by mouth daily.    [provider]  polyethylene glycol (MIRALAX / GLYCOLAX) packet Take 17 g by mouth daily as needed for mild constipation. 03/10/17   Glade LloydAlekh, Kshitiz, MD  traZODone (DESYREL) 100 MG tablet Take 1 tablet (100 mg total) by mouth at bedtime. Patient taking differently: Take 200 mg by mouth at bedtime.  04/25/16   Myrlene Brokerrawford, Elizabeth A, MD  trimethoprim (TRIMPEX) 100 MG tablet Take 100 mg by mouth at bedtime.    [provider]    Family History Family History  Problem Relation Age of Onset  . ALS Mother   . Heart disease Father   . Heart disease Maternal Grandfather   . Cancer Maternal Grandfather        breast    Social History Social History   Tobacco Use   . Smoking status: Never Smoker  . Smokeless tobacco: Never Used  Substance Use Topics  . Alcohol use: No    Alcohol/week: 0.0 standard drinks  . Drug use: No     Allergies   Codeine   Review of Systems Review of Systems  Unable to perform ROS: Dementia     Physical Exam Updated Vital Signs Pulse 75   Temp 98.7 F (37.1 C) (Oral)   Resp 20   Ht 5' (1.524 m)   Wt 67.1 kg   SpO2 96%   BMI 28.90 kg/m   Physical Exam Vitals signs and nursing note reviewed.  Constitutional:      General: She is not in acute distress.    Appearance: She is well-developed. She is obese.  HENT:     Head: Normocephalic and atraumatic.     Comments: Abrasion L temporal area    Nose:     Comments: No septal hematoma or hemotympanum    Mouth/Throat:     Pharynx: No oropharyngeal exudate.  Eyes:     Conjunctiva/sclera: Conjunctivae normal.     Pupils: Pupils are equal, round, and reactive to light.  Neck:     Musculoskeletal: Normal range of motion and neck supple.     Comments: No C spine tenderness Cardiovascular:     Rate and Rhythm: Normal rate and regular rhythm.     Heart sounds: Normal heart sounds. No murmur.  Pulmonary:     Effort: Pulmonary effort is normal. No respiratory distress.     Breath sounds: Normal breath sounds.  Chest:     Chest wall: No tenderness.  Abdominal:     Palpations: Abdomen is soft.     Tenderness: There is no abdominal tenderness. There is no guarding or rebound.  Musculoskeletal: Normal range of motion.        General: Tenderness present.     Comments: Lumbar tenderness. No stepoffs or deformity. FROM hips without pain.  Skin:    General: Skin is warm.     Capillary Refill: Capillary refill takes less than 2 seconds.  Neurological:     General: No focal deficit present.     Mental Status: She is alert. Mental status is at baseline.     Cranial Nerves: No cranial nerve deficit.     Motor: No abnormal muscle tone.     Coordination:  Coordination normal.     Comments: Oriented to person only.  She is calm and cooperative.  Moves all extremities to command  Psychiatric:        Behavior: Behavior normal.      ED Treatments / Results  Labs (all labs  ordered are listed, but only abnormal results are displayed) Labs Reviewed  URINALYSIS, ROUTINE W REFLEX MICROSCOPIC - Abnormal; Notable for the following components:      Result Value   Specific Gravity, Urine >1.030 (*)    All other components within normal limits  CBC WITH DIFFERENTIAL/PLATELET - Abnormal; Notable for the following components:   RBC 3.81 (*)    MCV 100.5 (*)    Monocytes Absolute 1.1 (*)    All other components within normal limits  COMPREHENSIVE METABOLIC PANEL - Abnormal; Notable for the following components:   Glucose, Bld 122 (*)    BUN 24 (*)    Creatinine, Ser 1.18 (*)    Albumin 3.4 (*)    GFR calc non Af Amer 41 (*)    GFR calc Af Amer 47 (*)    All other components within normal limits  URINE CULTURE  SARS CORONAVIRUS 2 (TAT 6-24 HRS)  AMMONIA  LIPASE, BLOOD  TROPONIN I (HIGH SENSITIVITY)    EKG None  Radiology Dg Chest 2 View  Result Date: 12/23/2018 CLINICAL DATA:  Fall EXAM: CHEST - 2 VIEW COMPARISON:  10/25/2018 FINDINGS: Mild cardiomegaly. Aortic atherosclerosis. Lingular atelectasis. Right lung clear. No effusions or pneumothorax. No acute bony abnormality. IMPRESSION: Mild cardiomegaly.  Lingular atelectasis.  No active disease. Electronically Signed   By: Charlett Nose M.D.   On: 12/23/2018 19:49   Dg Lumbar Spine 2-3 Views  Result Date: 12/23/2018 CLINICAL DATA:  Fall EXAM: LUMBAR SPINE - 2-3 VIEW COMPARISON:  None. FINDINGS: Diffuse degenerative disc and facet disease throughout the lumbar spine. No fracture. No subluxation. SI joints symmetric and unremarkable. Aortoiliac atherosclerosis. No aneurysm. IMPRESSION: Diffuse advanced lumbar spondylosis.  No acute bony abnormality. Electronically Signed   By: Charlett Nose  M.D.   On: 12/23/2018 19:50   Ct Head Wo Contrast  Result Date: 12/23/2018 CLINICAL DATA:  Head injury after fall. EXAM: CT HEAD WITHOUT CONTRAST CT CERVICAL SPINE WITHOUT CONTRAST TECHNIQUE: Multidetector CT imaging of the head and cervical spine was performed following the standard protocol without intravenous contrast. Multiplanar CT image reconstructions of the cervical spine were also generated. COMPARISON:  March 06, 2017. FINDINGS: CT HEAD FINDINGS Brain: Mild diffuse cortical atrophy is noted. Mild chronic ischemic white matter disease is noted. No mass effect or midline shift is noted. Ventricular size is within normal limits. There is no evidence of mass lesion, hemorrhage or acute infarction. Vascular: No hyperdense vessel or unexpected calcification. Skull: Normal. Negative for fracture or focal lesion. Sinuses/Orbits: No acute finding. Other: None. CT CERVICAL SPINE FINDINGS Alignment: Mild grade 1 anterolisthesis of C3-4 is noted secondary to posterior facet joint hypertrophy. Skull base and vertebrae: No acute fracture. No primary bone lesion or focal pathologic process. Soft tissues and spinal canal: No prevertebral fluid or swelling. No visible canal hematoma. Disc levels: Severe degenerative disc disease is noted at C3-4, C4-5, C5-6 and C6-7 with anterior posterior osteophyte formation. Upper chest: Negative. Other: Degenerative changes seen involving posterior facet joints bilaterally. IMPRESSION: 1. Mild diffuse cortical atrophy. Mild chronic ischemic white matter disease. No acute intracranial abnormality seen. 2. Severe multilevel degenerative disc disease. No acute abnormality seen in the cervical spine. Electronically Signed   By: Lupita Raider M.D.   On: 12/23/2018 19:03   Ct Cervical Spine Wo Contrast  Result Date: 12/23/2018 CLINICAL DATA:  Head injury after fall. EXAM: CT HEAD WITHOUT CONTRAST CT CERVICAL SPINE WITHOUT CONTRAST TECHNIQUE: Multidetector CT imaging of the head  and  cervical spine was performed following the standard protocol without intravenous contrast. Multiplanar CT image reconstructions of the cervical spine were also generated. COMPARISON:  March 06, 2017. FINDINGS: CT HEAD FINDINGS Brain: Mild diffuse cortical atrophy is noted. Mild chronic ischemic white matter disease is noted. No mass effect or midline shift is noted. Ventricular size is within normal limits. There is no evidence of mass lesion, hemorrhage or acute infarction. Vascular: No hyperdense vessel or unexpected calcification. Skull: Normal. Negative for fracture or focal lesion. Sinuses/Orbits: No acute finding. Other: None. CT CERVICAL SPINE FINDINGS Alignment: Mild grade 1 anterolisthesis of C3-4 is noted secondary to posterior facet joint hypertrophy. Skull base and vertebrae: No acute fracture. No primary bone lesion or focal pathologic process. Soft tissues and spinal canal: No prevertebral fluid or swelling. No visible canal hematoma. Disc levels: Severe degenerative disc disease is noted at C3-4, C4-5, C5-6 and C6-7 with anterior posterior osteophyte formation. Upper chest: Negative. Other: Degenerative changes seen involving posterior facet joints bilaterally. IMPRESSION: 1. Mild diffuse cortical atrophy. Mild chronic ischemic white matter disease. No acute intracranial abnormality seen. 2. Severe multilevel degenerative disc disease. No acute abnormality seen in the cervical spine. Electronically Signed   By: Lupita Raider M.D.   On: 12/23/2018 19:03    Procedures Procedures (including critical care time)  Medications Ordered in ED Medications - No data to display   Initial Impression / Assessment and Plan / ED Course  I have reviewed the triage vital signs and the nursing notes.  Pertinent labs & imaging results that were available during my care of the patient were reviewed by me and considered in my medical decision making (see chart for details).       Dementia patient  here after witnessed fall.  Sustained head injury.  No loss of consciousness.  Also caregiver concern for UTI.  Work-up is reassuring.  CT head is negative and C spine is stable. CXR without infiltrate. Lumbar spine xray with spondylosis. Urinalysis shows no UTI.  Culture is sent  Caregiver at bedside states patient is not acting like herself and still seems confused and altered. She requests additional workup for AMS.  Work-up shows normal laboratory studies.  No explanation for any change in mental status.  Caregiver states that patient is now closer to her baseline and does not want patient admitted for any further investigations.  She feels patient can go home tonight and feels that her confusion may be due to not sleeping for the past several days.  Reassured that there is no evidence of UTI but culture is in process.  Labs do not suggest A cause of her altered mental status. Continue quaratine precautions while covid test is in process. Patient and caregiver are agreeable to follow-up with her PCP.  Return precautions discussed.  Bonnie Mora was evaluated in Emergency Department on 12/24/2018 for the symptoms described in the history of present illness. She was evaluated in the context of the global COVID-19 pandemic, which necessitated consideration that the patient might be at risk for infection with the SARS-CoV-2 virus that causes COVID-19. Institutional protocols and algorithms that pertain to the evaluation of patients at risk for COVID-19 are in a state of rapid change based on information released by regulatory bodies including the CDC and federal and state organizations. These policies and algorithms were followed during the patient's care in the ED.  Final Clinical Impressions(s) / ED Diagnoses   Final diagnoses:  Fall, initial encounter  Injury of head,  initial encounter    ED Discharge Orders    None       Jarika Robben, Jeannett Senior, MD 12/24/18 [phone]

## 2018-12-23 NOTE — ED Notes (Signed)
Pt ambulated with family assistance. Provided water. EDP made aware.

## 2018-12-23 NOTE — ED Notes (Signed)
Received report. Bedside commode brought to bedside for urine sample as recommended by visitor at bedside. Pt states she does not have to void at this time. Cath likely required, will continue to monitor.

## 2018-12-23 NOTE — Discharge Instructions (Signed)
Your testing is reassuring.  There is no evidence of any serious head injury.  Urinalysis appears to be clean but culture is in process.  Keep yourself quarantine later coronavirus test is in process.  Return to the ED with new or worsening symptoms.

## 2018-12-23 NOTE — ED Notes (Signed)
In and out cath completed. Pt combative. Sample collected and escorted to lab.

## 2018-12-23 NOTE — ED Notes (Addendum)
Pt alert, disoriented at baseline.  Per caregiver, best way to collect urine to provide bedside commode, stating pt will become combative with straight cath urine collection.  Pt changed into gown for CT

## 2018-12-23 NOTE — ED Notes (Signed)
ED Provider at bedside. 

## 2018-12-23 NOTE — ED Triage Notes (Signed)
Family member states fall today with puncture wound to left side of head , bleeding controlled . Sent here by PMD for head ct. Also ? UTI

## 2018-12-23 NOTE — ED Notes (Signed)
Pt stuck three times for IV and lab work, attempt in R upper arm unsuccessful. Labs drawn in R [facility].

## 2018-12-23 NOTE — ED Notes (Signed)
Pt with dementia , fighting with staff during triage

## 2018-12-23 NOTE — ED Notes (Signed)
Patient transported to CT 

## 2018-12-23 NOTE — ED Notes (Signed)
Incontinent of stool x1. Loose. Pt's brief changed and repositioned.

## 2018-12-24 LAB — URINE CULTURE: Culture: NO GROWTH

## 2018-12-24 LAB — SARS CORONAVIRUS 2 (TAT 6-24 HRS): SARS Coronavirus 2: NEGATIVE

## 2019-01-19 ENCOUNTER — Other Ambulatory Visit: Payer: Medicare Other | Admitting: Hospice

## 2019-01-19 ENCOUNTER — Other Ambulatory Visit: Payer: Self-pay

## 2019-01-19 DIAGNOSIS — G308 Other Alzheimer's disease: Secondary | ICD-10-CM

## 2019-01-19 DIAGNOSIS — Z515 Encounter for palliative care: Secondary | ICD-10-CM

## 2019-01-19 DIAGNOSIS — F0281 Dementia in other diseases classified elsewhere with behavioral disturbance: Secondary | ICD-10-CM

## 2019-01-19 NOTE — Progress Notes (Signed)
[facility] Consult Note Telephone: [phone]  Fax: [phone]  PATIENT NAME: Bonnie Mora DOB: 05/22/1926 MRN: [id number]  PRIMARY CARE PROVIDER:   Harlan Stains, MD  REFERRING PROVIDER:  Harlan Stains, MD [place],  [place] [address]  RESPONSIBLE PARTY:Son Palmer [phone] Patient lives at home with caregiver Joelene Millin  [phone]     TELEHEALTH VISIT STATEMENT Due to the COVID-19 crisis, this visit was done via telephone from my office. It was initiated and consented to by this patient and/or family.  RECOMMENDATIONS/PLAN:   Advance Care Planning/Goals of Care: Telehealth Visit consisted of building trust and follow up on patient's health status. Patient with no acute changes; in no distress. She had a fall early Dec '20 hitting her head, CT scan of head negative and C-spine stable. Patient is a DNR, form at home; uploaded in [facility].  Goals of care include to maximize quality of life and symptom management. Symptom management: Dementia FAST 6c requiring assistance/guidance to dress properly. Still ambulatory with rolling walker. Memory loss related to Dementia at baseline. Patient remains continent of bowel and bladder.  Joelene Millin - caregiver lives at home with patient - provides care at home; patient compliant with her medications. Ongoing supportive care encouraged.  Follow up: Palliative care will continue to follow patient for goals of care clarification and symptom management. I spent 30 minutes providing this consultation from 3.pm to 3.30pm. More than 50% of the time in this consultation was spent on coordinating communication  [place] a 85 y.o.year oldfemalewith multiple medical problems including Alzheimer's dementia, recurrent UTIs, HLD, HTN, anxiety. Palliative Care was asked to help address goals of care.  CODE STATUS: DNR  PPS:  50% HOSPICE ELIGIBILITY/DIAGNOSIS: TBD  PAST MEDICAL HISTORY:  Past Medical History:  Diagnosis Date  . Dementia ([facility])   . Hyperlipidemia   . Hypertension   . Parkinson disease ([facility])     SOCIAL HX:  Social History   Tobacco Use  . Smoking status: Never Smoker  . Smokeless tobacco: Never Used  Substance Use Topics  . Alcohol use: No    Alcohol/week: 0.0 standard drinks    ALLERGIES:  Allergies  Allergen Reactions  . Codeine Nausea And Vomiting     PERTINENT MEDICATIONS:  Outpatient Encounter Medications as of 01/19/2019  Medication Sig  . acetaminophen (TYLENOL) 325 MG tablet Take 2 tablets (650 mg total) by mouth every 6 (six) hours as needed for mild pain (or Fever >/= 101).  Marland Kitchen ALPRAZolam (XANAX) 0.5 MG tablet Take 1 tablet (0.5 mg total) by mouth 2 (two) times daily as needed for anxiety.  Marland Kitchen aspirin 325 MG tablet Take 1 tablet (325 mg total) by mouth daily.  Marland Kitchen atorvastatin (LIPITOR) 20 MG tablet TAKE 1 TABLET AT 6PM FOR CHOLESTEROL. (Patient taking differently: Take 20 mg by mouth at bedtime. )  . citalopram (CELEXA) 20 MG tablet Take 1 tablet (20 mg total) by mouth daily.  . metoprolol succinate (TOPROL-XL) 50 MG 24 hr tablet Take 1 tablet (50 mg total) by mouth daily with breakfast.  . nystatin cream (MYCOSTATIN) Apply to affected area 2 times daily (Patient taking differently: 1 application 2 (two) times daily as needed for dry skin. )  . omeprazole (PRILOSEC) 20 MG capsule Take 20 mg by mouth daily.  . polyethylene glycol (MIRALAX / GLYCOLAX) packet Take 17 g by mouth daily as needed for mild constipation.  Marland Kitchen  traZODone (DESYREL) 100 MG tablet Take 1 tablet (100 mg total) by mouth at bedtime. (Patient taking differently: Take 200 mg by mouth at bedtime. )  . trimethoprim (TRIMPEX) 100 MG tablet Take 100 mg by mouth at bedtime.   No facility-administered encounter medications on file as of 01/19/2019.    [name], NP

## 2019-02-21 ENCOUNTER — Other Ambulatory Visit: Payer: Medicare Other | Admitting: Hospice

## 2019-02-21 ENCOUNTER — Other Ambulatory Visit: Payer: Self-pay

## 2019-02-21 DIAGNOSIS — Z515 Encounter for palliative care: Secondary | ICD-10-CM

## 2019-02-21 DIAGNOSIS — F0281 Dementia in other diseases classified elsewhere with behavioral disturbance: Secondary | ICD-10-CM

## 2019-02-21 DIAGNOSIS — F02818 Dementia in other diseases classified elsewhere, unspecified severity, with other behavioral disturbance: Secondary | ICD-10-CM

## 2019-02-21 NOTE — Progress Notes (Signed)
Therapist, nutritional Palliative Care Consult Note Telephone: [phone]  Fax: [phone]  PATIENT NAME: Bonnie Mora DOB: 1926-05-13 MRN: [id number]  PRIMARY CARE PROVIDER:   Laurann Montana, MD  REFERRING PROVIDER:  Laurann Montana, MD [phone] [place],  [place] [address]  RESPONSIBLE PARTY:[name] [phone] Patient lives at home with caregiver Bonnie Mora  [phone]   TELEHEALTH VISIT STATEMENT Due to the COVID-19 crisis, this visit was done via telephone from my office. It was initiated and consented to by this patient and/or family  RECOMMENDATIONS/PLAN:  Advance Care Planning/Goals of Care:  Patient is a DNR, form at home; uploaded in Epic. Goals of care include to maximize quality of life and symptom management. Symptom management:Dementia FAST 6c progressed to FAST 6d, incontinent of bowel and bladder;  requiring assistance/guidance to dress properly. Still ambulatory with rolling walker. Memory loss related to Dementia at baseline.   Bonnie Mora - caregiver lives at home with patient;  patient compliant with her medications. No report of recent falls or UTI. Patient continues on UTI prophylaxix Macrobid 100mg  daily. Ongoing supportive care encouraged. Follow care will continue to follow patient for goals of care clarification and symptom management. I spent9minutes providing this consultation.More than 50% of the time in this consultation was spent on coordinating communication  HISTORY OF PRESENT ILLNESS:Bonnie J Coxis a 86 y.o.year oldfemalewith multiple medical problems including Alzheimer's dementia, recurrent UTIs, HLD, HTN, anxiety. Palliative Care was asked to help address goals of care   CODE STATUS: DNR  PPS: 50% HOSPICE ELIGIBILITY/DIAGNOSIS: TBD  PAST MEDICAL HISTORY:  Past Medical History:  Diagnosis Date  . Dementia (HCC)   . Hyperlipidemia   . Hypertension     . Parkinson disease (HCC)     SOCIAL HX:  Social History   Tobacco Use  . Smoking status: Never Smoker  . Smokeless tobacco: Never Used  Substance Use Topics  . Alcohol use: No    Alcohol/week: 0.0 standard drinks    ALLERGIES:  Allergies  Allergen Reactions  . Codeine Nausea And Vomiting     PERTINENT MEDICATIONS:  Outpatient Encounter Medications as of 02/21/2019  Medication Sig  . acetaminophen (TYLENOL) 325 MG tablet Take 2 tablets (650 mg total) by mouth every 6 (six) hours as needed for mild pain (or Fever >/= 101).  04/21/2019 ALPRAZolam (XANAX) 0.5 MG tablet Take 1 tablet (0.5 mg total) by mouth 2 (two) times daily as needed for anxiety.  Marland Kitchen aspirin 325 MG tablet Take 1 tablet (325 mg total) by mouth daily.  Marland Kitchen atorvastatin (LIPITOR) 20 MG tablet TAKE 1 TABLET AT 6PM FOR CHOLESTEROL. (Patient taking differently: Take 20 mg by mouth at bedtime. )  . citalopram (CELEXA) 20 MG tablet Take 1 tablet (20 mg total) by mouth daily.  . metoprolol succinate (TOPROL-XL) 50 MG 24 hr tablet Take 1 tablet (50 mg total) by mouth daily with breakfast.  . nystatin cream (MYCOSTATIN) Apply to affected area 2 times daily (Patient taking differently: 1 application 2 (two) times daily as needed for dry skin. )  . omeprazole (PRILOSEC) 20 MG capsule Take 20 mg by mouth daily.  . polyethylene glycol (MIRALAX / GLYCOLAX) packet Take 17 g by mouth daily as needed for mild constipation.  . traZODone (DESYREL) 100 MG tablet Take 1 tablet (100 mg total) by mouth at bedtime. (Patient taking differently: Take 200 mg by mouth at bedtime. )  . trimethoprim (TRIMPEX) 100 MG tablet Take 100  mg by mouth at bedtime.   No facility-administered encounter medications on file as of 02/21/2019.    Teodoro Spray, NP

## 2019-03-02 ENCOUNTER — Other Ambulatory Visit: Payer: Self-pay | Admitting: Adult Health

## 2019-03-02 NOTE — Progress Notes (Signed)
  I connected by phone with Anson Crofts [place] on 03/02/2019 at 7:07 PM to discuss the potential use of an new treatment for mild to moderate COVID-19 viral infection in non-hospitalized patients.  This patient is a 86 y.o. female that meets the FDA criteria for Emergency Use Authorization of bamlanivimab or casirivimab\imdevimab.  Has a (+) direct SARS-CoV-2 viral test result  Has mild or moderate COVID-19   Is ? 84 years of age and weighs ? 40 kg  Is NOT hospitalized due to COVID-19  Is NOT requiring oxygen therapy or requiring an increase in baseline oxygen flow rate due to COVID-19  Is within 10 days of symptom onset  Has at least one of the high risk factor(s) for progression to severe COVID-19 and/or hospitalization as defined in EUA.  Specific high risk criteria : >/= 84 yo   I have spoken and communicated the following to the patient or parent/caregiver:  1. FDA has authorized the emergency use of bamlanivimab and casirivimab\imdevimab for the treatment of mild to moderate COVID-19 in adults and pediatric patients with positive results of direct SARS-CoV-2 viral testing who are 50 years of age and older weighing at least 40 kg, and who are at high risk for progressing to severe COVID-19 and/or hospitalization.  2. The significant known and potential risks and benefits of bamlanivimab and casirivimab\imdevimab, and the extent to which such potential risks and benefits are unknown.  3. Information on available alternative treatments and the risks and benefits of those alternatives, including clinical trials.  4. Patients treated with bamlanivimab and casirivimab\imdevimab should continue to self-isolate and use infection control measures (e.g., wear mask, isolate, social distance, avoid sharing personal items, clean and disinfect "high touch" surfaces, and frequent handwashing) according to CDC guidelines.   5. The patient or parent/caregiver has the option to accept or refuse  bamlanivimab or casirivimab\imdevimab .  After reviewing this information with the patient, The patient agreed to proceed with receiving the casirivimab\imdevimab infusion and will be provided a copy of the Fact sheet prior to receiving the infusion.   Scheduled 03/03/19 at 1230. Will bring copy of test.  . Delylah Stanczyk 03/02/2019 7:07 PM

## 2019-03-03 ENCOUNTER — Ambulatory Visit (HOSPITAL_COMMUNITY)
Admission: RE | Admit: 2019-03-03 | Discharge: 2019-03-03 | Disposition: A | Discharge status: Home / Self Care | Payer: Medicare Other | Source: Ambulatory Visit | Attending: Pulmonary Disease | Admitting: Pulmonary Disease

## 2019-03-03 ENCOUNTER — Encounter (HOSPITAL_COMMUNITY): Payer: Self-pay | Admitting: Family Medicine

## 2019-03-03 DIAGNOSIS — Z23 Encounter for immunization: Secondary | ICD-10-CM | POA: Diagnosis not present

## 2019-03-03 DIAGNOSIS — U071 COVID-19: Secondary | ICD-10-CM | POA: Diagnosis present

## 2019-03-03 MED ORDER — METHYLPREDNISOLONE SODIUM SUCC 125 MG IJ SOLR: 125.0000 mg | Freq: Once | INTRAMUSCULAR | Status: DC | PRN

## 2019-03-03 MED ORDER — SODIUM CHLORIDE 0.9 % IV SOLN
Freq: Once | INTRAVENOUS | Status: AC
  Filled 2019-03-03: qty 10

## 2019-03-03 MED ORDER — DIPHENHYDRAMINE HCL 50 MG/ML IJ SOLN: 50.0000 mg | Freq: Once | INTRAMUSCULAR | Status: DC | PRN

## 2019-03-03 MED ORDER — ALBUTEROL SULFATE HFA 108 (90 BASE) MCG/ACT IN AERS: 2.0000 | INHALATION_SPRAY | Freq: Once | RESPIRATORY_TRACT | Status: DC | PRN

## 2019-03-03 MED ORDER — SODIUM CHLORIDE 0.9 % IV SOLN
INTRAVENOUS | Status: DC | PRN
  Administered 2019-03-03: 250 mL via INTRAVENOUS

## 2019-03-03 MED ORDER — FAMOTIDINE IN NACL 20-0.9 MG/50ML-% IV SOLN: 20.0000 mg | Freq: Once | INTRAVENOUS | Status: DC | PRN

## 2019-03-03 MED ORDER — EPINEPHRINE 0.3 MG/0.3ML IJ SOAJ: 0.3000 mg | Freq: Once | INTRAMUSCULAR | Status: DC | PRN

## 2019-03-03 NOTE — Discharge Instructions (Signed)

## 2019-03-03 NOTE — Progress Notes (Signed)
  Diagnosis: COVID-19  Physician: Dr. Delford Field  Procedure: [facility] Med: bamlanivimab infusion - Provided patient with bamlanimivab fact sheet for patients, parents and caregivers prior to infusion.  Complications: No immediate complications noted.  Discharge: Discharged home   Bonnie Mora 03/03/2019

## 2019-03-14 ENCOUNTER — Encounter (HOSPITAL_BASED_OUTPATIENT_CLINIC_OR_DEPARTMENT_OTHER): Payer: Self-pay | Admitting: *Deleted

## 2019-03-14 ENCOUNTER — Emergency Department (HOSPITAL_BASED_OUTPATIENT_CLINIC_OR_DEPARTMENT_OTHER)
Admission: EM | Admit: 2019-03-14 | Discharge: 2019-03-14 | Disposition: A | Discharge status: Home / Self Care | Payer: Medicare Other | Attending: Emergency Medicine | Admitting: Emergency Medicine

## 2019-03-14 ENCOUNTER — Emergency Department (HOSPITAL_BASED_OUTPATIENT_CLINIC_OR_DEPARTMENT_OTHER): Payer: Medicare Other

## 2019-03-14 ENCOUNTER — Other Ambulatory Visit: Payer: Self-pay

## 2019-03-14 DIAGNOSIS — G20 Parkinson's disease: Secondary | ICD-10-CM | POA: Insufficient documentation

## 2019-03-14 DIAGNOSIS — E785 Hyperlipidemia, unspecified: Secondary | ICD-10-CM | POA: Diagnosis not present

## 2019-03-14 DIAGNOSIS — F0280 Dementia in other diseases classified elsewhere without behavioral disturbance: Secondary | ICD-10-CM | POA: Insufficient documentation

## 2019-03-14 DIAGNOSIS — R531 Weakness: Secondary | ICD-10-CM | POA: Diagnosis present

## 2019-03-14 DIAGNOSIS — I10 Essential (primary) hypertension: Secondary | ICD-10-CM | POA: Diagnosis not present

## 2019-03-14 DIAGNOSIS — N3000 Acute cystitis without hematuria: Secondary | ICD-10-CM | POA: Diagnosis not present

## 2019-03-14 LAB — CBC WITH DIFFERENTIAL/PLATELET
Abs Immature Granulocytes: 0.1 10*3/uL — ABNORMAL HIGH (ref 0.00–0.07)
Basophils Absolute: 0 10*3/uL (ref 0.0–0.1)
Basophils Relative: 1 %
Eosinophils Absolute: 0 10*3/uL (ref 0.0–0.5)
Eosinophils Relative: 0 %
HCT: 39.9 % (ref 36.0–46.0)
Hemoglobin: 12.6 g/dL (ref 12.0–15.0)
Immature Granulocytes: 1 %
Lymphocytes Relative: 23 %
Lymphs Abs: 1.9 10*3/uL (ref 0.7–4.0)
MCH: 31 pg (ref 26.0–34.0)
MCHC: 31.6 g/dL (ref 30.0–36.0)
MCV: 98 fL (ref 80.0–100.0)
Monocytes Absolute: 0.9 10*3/uL (ref 0.1–1.0)
Monocytes Relative: 11 %
Neutro Abs: 5.3 10*3/uL (ref 1.7–7.7)
Neutrophils Relative %: 64 %
Platelets: 235 10*3/uL (ref 150–400)
RBC: 4.07 MIL/uL (ref 3.87–5.11)
RDW: 13.4 % (ref 11.5–15.5)
WBC: 8.3 10*3/uL (ref 4.0–10.5)
nRBC: 0 % (ref 0.0–0.2)

## 2019-03-14 LAB — URINALYSIS, MICROSCOPIC (REFLEX): RBC / HPF: NONE SEEN RBC/hpf (ref 0–5)

## 2019-03-14 LAB — D-DIMER, QUANTITATIVE: D-Dimer, Quant: 1.1 ug/mL-FEU — ABNORMAL HIGH (ref 0.00–0.50)

## 2019-03-14 LAB — COMPREHENSIVE METABOLIC PANEL
ALT: 14 U/L (ref 0–44)
AST: 25 U/L (ref 15–41)
Albumin: 3.4 g/dL — ABNORMAL LOW (ref 3.5–5.0)
Alkaline Phosphatase: 59 U/L (ref 38–126)
Anion gap: 8 (ref 5–15)
BUN: 24 mg/dL — ABNORMAL HIGH (ref 8–23)
CO2: 28 mmol/L (ref 22–32)
Calcium: 9.5 mg/dL (ref 8.9–10.3)
Chloride: 104 mmol/L (ref 98–111)
Creatinine, Ser: 0.97 mg/dL (ref 0.44–1.00)
GFR calc Af Amer: 60 mL/min — ABNORMAL LOW (ref >60)
GFR calc non Af Amer: 51 mL/min — ABNORMAL LOW (ref >60)
Glucose, Bld: 123 mg/dL — ABNORMAL HIGH (ref 70–99)
Potassium: 4.5 mmol/L (ref 3.5–5.1)
Sodium: 140 mmol/L (ref 135–145)
Total Bilirubin: 0.2 mg/dL — ABNORMAL LOW (ref 0.3–1.2)
Total Protein: 6.7 g/dL (ref 6.5–8.1)

## 2019-03-14 LAB — C-REACTIVE PROTEIN: CRP: 0.5 mg/dL (ref <1.0)

## 2019-03-14 LAB — URINALYSIS, ROUTINE W REFLEX MICROSCOPIC
Bilirubin Urine: NEGATIVE
Glucose, UA: NEGATIVE mg/dL
Hgb urine dipstick: NEGATIVE
Ketones, ur: NEGATIVE mg/dL
Leukocytes,Ua: NEGATIVE
Nitrite: POSITIVE — AB
Protein, ur: NEGATIVE mg/dL
Specific Gravity, Urine: 1.025 (ref 1.005–1.030)
pH: 6 (ref 5.0–8.0)

## 2019-03-14 LAB — LACTATE DEHYDROGENASE: LDH: 192 U/L (ref 98–192)

## 2019-03-14 LAB — LACTIC ACID, PLASMA: Lactic Acid, Venous: 1.8 mmol/L (ref 0.5–1.9)

## 2019-03-14 LAB — FIBRINOGEN: Fibrinogen: 372 mg/dL (ref 210–475)

## 2019-03-14 LAB — TRIGLYCERIDES: Triglycerides: 178 mg/dL — ABNORMAL HIGH (ref <150)

## 2019-03-14 LAB — FERRITIN: Ferritin: 220 ng/mL (ref 11–307)

## 2019-03-14 LAB — PROCALCITONIN: Procalcitonin: <0.1 ng/mL

## 2019-03-14 MED ORDER — SODIUM CHLORIDE 0.9 % IV BOLUS
500.0000 mL | Freq: Once | INTRAVENOUS | Status: AC
  Administered 2019-03-14: 500 mL via INTRAVENOUS

## 2019-03-14 MED ORDER — CEPHALEXIN 500 MG PO CAPS: 500.0000 mg | ORAL_CAPSULE | Freq: Three times a day (TID) | ORAL | 0 refills | Status: AC

## 2019-03-14 MED ORDER — SODIUM CHLORIDE 0.9 % IV SOLN
INTRAVENOUS | Status: DC | PRN
  Administered 2019-03-14: 250 mL via INTRAVENOUS

## 2019-03-14 MED ORDER — SODIUM CHLORIDE 0.9 % IV SOLN
1.0000 g | Freq: Once | INTRAVENOUS | Status: AC
  Administered 2019-03-14: 1 g via INTRAVENOUS
  Filled 2019-03-14: qty 10

## 2019-03-14 NOTE — ED Notes (Signed)
IV to R [facility] is extremely positional and not usable for infusion at this time. Pt has poor venous access. Nanny, RN attempted without success. EDP made aware. Awaiting lab results to develop a plan of care.

## 2019-03-14 NOTE — ED Triage Notes (Signed)
Hx of alzheimer's disease. This am she fell over and hit her hand. She was taken to UC and they found she has a UTI. She was started on antibiotics but has not had a dose yet.

## 2019-03-14 NOTE — Discharge Instructions (Signed)
You were evaluated in the Emergency Department and after careful evaluation, we did not find any emergent condition requiring admission or further testing in the hospital.  Please take the antibiotics as directed and follow-up closely with primary care doctor.  Please return to the Emergency Department if you experience any worsening of your condition.  We encourage you to follow up with a primary care provider.  Thank you for allowing [place] to be a part of your care.

## 2019-03-14 NOTE — ED Provider Notes (Signed)
[facility] Emergency Department Provider Note MRN:  [id number]  Arrival date & time: 03/14/19     Chief Complaint   Weakness and Recurrent UTI   History of Present Illness   Bonnie Mora is a 86 y.o. year-old female with a history of dementia, HTN presenting to the ED with chief complaint of weakness and AMS.  Patient stumbled earlier today and hit her hand on the doorway. Has become more and more confused today.  Did not hit head.  Diagnosed with UTI at urgent care.  Daughter brought her here due to worsening confusion.  I was unable to obtain an accurate HPI, PMH, or ROS due to the patient's AMS.  Level 5 caveat.   Review of Systems  Positive for hand trauma, confusion.  Patient's Health History    Past Medical History:  Diagnosis Date  . Dementia ([place])   . Hyperlipidemia   . Hypertension   . Parkinson disease [facility])     Past Surgical History:  Procedure Laterality Date  . APPENDECTOMY    . JOINT REPLACEMENT     bilateral knee  . OVARIAN CYST SURGERY    . TONSILLECTOMY AND ADENOIDECTOMY      Family History  Problem Relation Age of Onset  . ALS Mother   . Heart disease Father   . Heart disease Maternal Grandfather   . Cancer Maternal Grandfather        breast    Social History   Socioeconomic History  . Marital status: Widowed    Spouse name: Not on file  . Number of children: Not on file  . Years of education: Not on file  . Highest education level: Not on file  Occupational History  . Not on file  Tobacco Use  . Smoking status: Never Smoker  . Smokeless tobacco: Never Used  Substance and Sexual Activity  . Alcohol use: No    Alcohol/week: 0.0 standard drinks  . Drug use: No  . Sexual activity: Not on file  Other Topics Concern  . Not on file  Social History Narrative  . Not on file   Social Determinants of Health   Financial Resource Strain:   . Difficulty of Paying Living Expenses: Not on file  Food  Insecurity:   . Worried About Charity fundraiser in the Last Year: Not on file  . Ran Out of Food in the Last Year: Not on file  Transportation Needs:   . Lack of Transportation (Medical): Not on file  . Lack of Transportation (Non-Medical): Not on file  Physical Activity:   . Days of Exercise per Week: Not on file  . Minutes of Exercise per Session: Not on file  Stress:   . Feeling of Stress : Not on file  Social Connections:   . Frequency of Communication with Friends and Family: Not on file  . Frequency of Social Gatherings with Friends and Family: Not on file  . Attends Religious Services: Not on file  . Active Member of Clubs or Organizations: Not on file  . Attends Archivist Meetings: Not on file  . Marital Status: Not on file  Intimate Partner Violence:   . Fear of Current or Ex-Partner: Not on file  . Emotionally Abused: Not on file  . Physically Abused: Not on file  . Sexually Abused: Not on file     Physical Exam   Vitals:   03/14/19 1558 03/14/19 1700  BP: (!) 203/79 (!) 224/83  Pulse: 63 65  Resp: 18 (!) 23  Temp:    SpO2: 95% 94%    CONSTITUTIONAL:  Chronically ill-appearing, NAD NEURO:  Somnolent, wakes to voice, oriented to name only, moving all extremities EYES:  eyes equal and reactive ENT/NECK:  no LAD, no JVD CARDIO:  regular rate, well-perfused, normal S1 and S2 PULM:  CTAB no wheezing or rhonchi GI/GU:  normal bowel sounds, non-distended, non-tender MSK/SPINE:  No gross deformities, no edema SKIN:  no rash, atraumatic PSYCH:  Appropriate speech and behavior  *Additional and/or pertinent findings included in MDM below  Diagnostic and Interventional Summary    EKG Interpretation  Date/Time:  Monday March 14 2019 15:53:19 EST Ventricular Rate:  66 PR Interval:    QRS Duration: 98 QT Interval:  475 QTC Calculation: 498 R Axis:   35 Text Interpretation: Sinus rhythm Borderline repolarization abnormality Borderline prolonged QT  interval Baseline wander in lead(s) V1 V2 Confirmed by Kennis Carina [phone]) on 03/14/2019 4:02:20 PM      Cardiac Monitoring Interpretation:  Labs Reviewed  CBC WITH DIFFERENTIAL/PLATELET - Abnormal; Notable for the following components:      Result Value   Abs Immature Granulocytes 0.10 (*)    All other components within normal limits  D-DIMER, QUANTITATIVE (NOT AT [facility]) - Abnormal; Notable for the following components:   D-Dimer, Quant 1.10 (*)    All other components within normal limits  URINALYSIS, ROUTINE W REFLEX MICROSCOPIC - Abnormal; Notable for the following components:   APPearance CLOUDY (*)    Nitrite POSITIVE (*)    All other components within normal limits  COMPREHENSIVE METABOLIC PANEL - Abnormal; Notable for the following components:   Glucose, Bld 123 (*)    BUN 24 (*)    Albumin 3.4 (*)    Total Bilirubin 0.2 (*)    GFR calc non Af Amer 51 (*)    GFR calc Af Amer 60 (*)    All other components within normal limits  URINALYSIS, MICROSCOPIC (REFLEX) - Abnormal; Notable for the following components:   Bacteria, UA MANY (*)    All other components within normal limits  CULTURE, BLOOD (ROUTINE X 2)  CULTURE, BLOOD (ROUTINE X 2)  URINE CULTURE  LACTIC ACID, PLASMA  FIBRINOGEN  LACTIC ACID, PLASMA  PROCALCITONIN  LACTATE DEHYDROGENASE  FERRITIN  TRIGLYCERIDES  C-REACTIVE PROTEIN    DG Chest Port 1 View  Final Result      Medications  0.9 %  sodium chloride infusion (250 mLs Intravenous New Bag/Given 03/14/19 1721)  cefTRIAXone (ROCEPHIN) 1 g in sodium chloride 0.9 % 100 mL IVPB (0 g Intravenous Stopped 03/14/19 1800)  sodium chloride 0.9 % bolus 500 mL (500 mLs Intravenous New Bag/Given 03/14/19 1647)     Procedures  /  Critical Care Procedures  ED Course and Medical Decision Making  I have reviewed the triage vital signs, the nursing notes, and pertinent available records from the EMR.  Pertinent labs & imaging results that were available during my  care of the patient were reviewed by me and considered in my medical decision making (see below for details).     Patient has COVID-19 diagnosed 12 days ago.  No respiratory symptoms, no hypoxia.  Here with AMS, likely due to UTI.  DNR and DNI per daughter.  Inpatient management recommended given her AMS, daughter prefers home management.  Not currently on hospice but on standby if needed.  Has home health aids as well.  Discussed the increased risk with home  mananagement but daughter prefers this over her coming into the hospital and developing delirium.  Will provide fluids, abx, and reassess.  6:14 PM update: Work-up is largely reassuring, patient is improving and conversing more like normal per daughter.  Daughter continues to prefer outpatient management.  Has plenty of home health support.  Appropriate for discharge with strict return precautions.  Elmer Sow. Pilar Plate, MD [facility] Emergency Medicine [facility] [email or web address]  Final Clinical Impressions(s) / ED Diagnoses     ICD-10-CM   1. Acute cystitis without hematuria  N30.00     ED Discharge Orders         Ordered    cephALEXin (KEFLEX) 500 MG capsule  3 times daily     03/14/19 1813           Discharge Instructions Discussed with and Provided to Patient:     Discharge Instructions     You were evaluated in the Emergency Department and after careful evaluation, we did not find any emergent condition requiring admission or further testing in the hospital.  Please take the antibiotics as directed and follow-up closely with primary care doctor.  Please return to the Emergency Department if you experience any worsening of your condition.  We encourage you to follow up with a primary care provider.  Thank you for allowing [place] to be a part of your care.        Sabas Sous, MD 03/14/19 [phone]

## 2019-03-17 LAB — URINE CULTURE: Culture: >=100000 — AB

## 2019-03-18 ENCOUNTER — Telehealth: Payer: Self-pay | Admitting: *Deleted

## 2019-03-18 NOTE — Telephone Encounter (Signed)
Post ED Visit - Positive Culture Follow-up: Unsuccessful Patient Follow-up  Culture assessed and recommendations reviewed by:  []  , Pharm.D. []  Enzo Bi, Pharm.D., BCPS AQ-ID []  , Pharm.D., BCPS []  Celedonio Miyamoto, Pharm.D., BCPS []  Jackson, Garvin Fila.D., BCPS, AAHIVP []  , Pharm.D., BCPS, AAHIVP []  Georgina Pillion, PharmD []  , PharmD, BCPS [place], PharmD  Positive urine culture  []  Patient discharged without antimicrobial prescription and treatment is now indicated []  Organism is resistant to prescribed ED discharge antimicrobial []  Patient with positive blood cultures  Plan: Symptom check, if still symptomatic follow up with PCP, if worsening return to the ED  Unable to contact patient after 3 attempts, letter will be sent to address on file  [place] 03/18/2019, 10:27 AM

## 2019-03-19 LAB — CULTURE, BLOOD (ROUTINE X 2)
Culture: NO GROWTH
Special Requests: ADEQUATE

## 2019-04-04 ENCOUNTER — Other Ambulatory Visit: Payer: Self-pay

## 2019-04-04 ENCOUNTER — Other Ambulatory Visit: Payer: Medicare Other | Admitting: Hospice

## 2019-04-04 DIAGNOSIS — Z515 Encounter for palliative care: Secondary | ICD-10-CM

## 2019-04-04 DIAGNOSIS — F02818 Dementia in other diseases classified elsewhere, unspecified severity, with other behavioral disturbance: Secondary | ICD-10-CM

## 2019-04-04 DIAGNOSIS — F0281 Dementia in other diseases classified elsewhere with behavioral disturbance: Secondary | ICD-10-CM

## 2019-04-04 NOTE — Progress Notes (Signed)
Designer, jewellery Palliative Care Consult Note Telephone: [phone]  Fax: [phone]  PATIENT NAME: Bonnie Mora DOB: 1926/04/25 MRN: [id number]  PRIMARY CARE PROVIDER:   Harlan Stains, MD  REFERRING PROVIDER:  Harlan Stains, MD [place],  [place] [address]  RESPONSIBLE PARTY:Son Halls [phone] Patient lives at home withcaregiver [phone] Candy: [phone]   TELEHEALTH VISIT STATEMENT Due to the COVID-19 crisis, this visit was done via telephone from my office. It was initiated and consented to by this patient and/or family  RECOMMENDATIONS/PLAN:  Advance Care Planning/Goals of Care:  Patientis a DNR, form at home; uploaded in [place].Goals of care include to maximize quality of life and symptom management. Patient is currently staying with Candy because Joelene Millin - caregiver - who lives at home with patient is sick.  Symptom management:Patient treated in the hospital with Ceftriaxone, Cephelaxin for acute cystitis without hematuria Feb 2021. She denied urinary symptoms today. Candy with no acute concerns. Dementia FAST FAST 6d, incontinent of bowel and bladder;  requiring assistance/guidance to dress properly. Still ambulatory with rolling walker.Memory loss related to Dementia at baseline. Patient is compliant with her medications and appetite is fairly good. Ongoing supportive care encouraged. Follow [id number] care will continue to follow patient for goals of care clarification and symptom management. I spent66minutes providing this consultation.More than 50% of the time in this consultation was spent on coordinating communication  Hernandez a 86 y.o.year oldfemalewith multiple medical problems including Alzheimer's dementia, recurrent UTIs, HLD, HTN, anxiety. Palliative Care was asked to help address goals of care   CODE STATUS:  DNR  PPS: 50% HOSPICE ELIGIBILITY/DIAGNOSIS: TBD  PAST MEDICAL HISTORY:  Past Medical History:  Diagnosis Date  . Dementia ([place])   . Hyperlipidemia   . Hypertension   . Parkinson disease ([place])     SOCIAL HX:  Social History   Tobacco Use  . Smoking status: Never Smoker  . Smokeless tobacco: Never Used  Substance Use Topics  . Alcohol use: No    Alcohol/week: 0.0 standard drinks    ALLERGIES:  Allergies  Allergen Reactions  . Codeine Nausea And Vomiting     PERTINENT MEDICATIONS:  Outpatient Encounter Medications as of 04/04/2019  Medication Sig  . acetaminophen (TYLENOL) 325 MG tablet Take 2 tablets (650 mg total) by mouth every 6 (six) hours as needed for mild pain (or Fever >/= 101).  Marland Kitchen ALPRAZolam (XANAX) 0.5 MG tablet Take 1 tablet (0.5 mg total) by mouth 2 (two) times daily as needed for anxiety.  Marland Kitchen aspirin 325 MG tablet Take 1 tablet (325 mg total) by mouth daily.  Marland Kitchen atorvastatin (LIPITOR) 20 MG tablet TAKE 1 TABLET AT 6PM FOR CHOLESTEROL. (Patient taking differently: Take 20 mg by mouth at bedtime. )  . citalopram (CELEXA) 20 MG tablet Take 1 tablet (20 mg total) by mouth daily.  . metoprolol succinate (TOPROL-XL) 50 MG 24 hr tablet Take 1 tablet (50 mg total) by mouth daily with breakfast.  . nystatin cream (MYCOSTATIN) Apply to affected area 2 times daily (Patient taking differently: 1 application 2 (two) times daily as needed for dry skin. )  . omeprazole (PRILOSEC) 20 MG capsule Take 20 mg by mouth daily.  . polyethylene glycol (MIRALAX / GLYCOLAX) packet Take 17 g by mouth daily as needed for mild constipation.  . traZODone (DESYREL) 100 MG tablet Take 1 tablet (100 mg total) by mouth at bedtime. (Patient  taking differently: Take 200 mg by mouth at bedtime. )  . trimethoprim (TRIMPEX) 100 MG tablet Take 100 mg by mouth at bedtime.   No facility-administered encounter medications on file as of 04/04/2019.    [name], NP

## 2019-05-23 ENCOUNTER — Other Ambulatory Visit: Payer: Self-pay

## 2019-05-23 ENCOUNTER — Other Ambulatory Visit: Payer: Medicare Other | Admitting: Hospice

## 2019-05-23 DIAGNOSIS — Z515 Encounter for palliative care: Secondary | ICD-10-CM

## 2019-05-23 DIAGNOSIS — G308 Other Alzheimer's disease: Secondary | ICD-10-CM

## 2019-05-23 DIAGNOSIS — F0281 Dementia in other diseases classified elsewhere with behavioral disturbance: Secondary | ICD-10-CM

## 2019-05-23 NOTE — Progress Notes (Signed)
Therapist, nutritional Palliative Care Consult Note Telephone: [phone]  Fax: [phone]  PATIENT NAME: Bonnie Mora DOB: 08/20/1926 MRN: [id number]  PRIMARY CARE PROVIDER:   Laurann Montana, MD  REFERRING PROVIDER:  Laurann Montana, MD [phone] Bonnie Mor[address] [place],  [place] [address]  RESPONSIBLE PARTY:Son Bonnie Mora [phone] Patient lives at home withcaregiver [phone] Bonnie Mora: [phone]   TELEHEALTH VISIT STATEMENT Due to the COVID-19 crisis, this visit was done via telephone from my office. It was initiated and consented to by this patient and/or family  RECOMMENDATIONS/PLAN:  Advance Care Planning/Goals of Care: Patientis a DNR, form at home; uploaded in Epic.Goals of care include to maximize quality of life and symptom management. Symptom management:Patient continues with her antibiotic for UTI.  Education provided on the need to take the antibiotic as ordered at and to completion.  Patient denies any urinary symptoms at this time.  Her appetite is good and no weight changes per caregiver.  Bonnie Mora reports that 2 weeks ago, patient fell and had a skin tear to left lower leg.  A home health nurse comes in to change the dressing.  Memory loss and confusion is ongoing related to Dementia FAST FAST 6d, incontinent of bowel and bladder;requiring assistance/guidance to dress properly. Still ambulatory with rolling walker. The need to use her rolling walker for some support and stabilit reinforced.Patient is compliant with her medications. Patient denies pain/discomfort.  Caregiver Bonnie Mora and son Bonnie Mora with no concerns at this time.  Ongoing supportive care encouraged. Follow [id number] care will continue to follow patient for goals of care clarification and symptom management. I spent49minutes providing this consultation, time includes chart review and documentation.More than 50% of the time in this  consultation was spent on coordinating communication  HISTORY OF PRESENT ILLNESS:Bonnie J Coxis a 86 y.o.year oldfemalewith multiple medical problems including Alzheimer's dementia, recurrent UTIs, HLD, HTN, anxiety. Palliative Care was asked to help address goals of care CODE STATUS: DNR  PPS: 50% HOSPICE ELIGIBILITY/DIAGNOSIS: TBD  PAST MEDICAL HISTORY:  Past Medical History:  Diagnosis Date  . Dementia ([facility])   . Hyperlipidemia   . Hypertension   . Parkinson disease ([facility])     SOCIAL HX:  Social History   Tobacco Use  . Smoking status: Never Smoker  . Smokeless tobacco: Never Used  Substance Use Topics  . Alcohol use: No    Alcohol/week: 0.0 standard drinks    ALLERGIES:  Allergies  Allergen Reactions  . Codeine Nausea And Vomiting     PERTINENT MEDICATIONS:  Outpatient Encounter Medications as of 05/23/2019  Medication Sig  . acetaminophen (TYLENOL) 325 MG tablet Take 2 tablets (650 mg total) by mouth every 6 (six) hours as needed for mild pain (or Fever >/= 101).  Marland Kitchen ALPRAZolam (XANAX) 0.5 MG tablet Take 1 tablet (0.5 mg total) by mouth 2 (two) times daily as needed for anxiety.  Marland Kitchen aspirin 325 MG tablet Take 1 tablet (325 mg total) by mouth daily.  Marland Kitchen atorvastatin (LIPITOR) 20 MG tablet TAKE 1 TABLET AT 6PM FOR CHOLESTEROL. (Patient taking differently: Take 20 mg by mouth at bedtime. )  . citalopram (CELEXA) 20 MG tablet Take 1 tablet (20 mg total) by mouth daily.  . metoprolol succinate (TOPROL-XL) 50 MG 24 hr tablet Take 1 tablet (50 mg total) by mouth daily with breakfast.  . nystatin cream (MYCOSTATIN) Apply to affected area 2 times daily (Patient taking differently: 1 application 2 (two) times daily as needed  for dry skin. )  . omeprazole (PRILOSEC) 20 MG capsule Take 20 mg by mouth daily.  . polyethylene glycol (MIRALAX / GLYCOLAX) packet Take 17 g by mouth daily as needed for mild constipation.  . traZODone (DESYREL) 100 MG tablet Take 1 tablet (100 mg  total) by mouth at bedtime. (Patient taking differently: Take 200 mg by mouth at bedtime. )  . trimethoprim (TRIMPEX) 100 MG tablet Take 100 mg by mouth at bedtime.   No facility-administered encounter medications on file as of 05/23/2019.    Teodoro Spray, NP

## 2019-06-30 ENCOUNTER — Other Ambulatory Visit: Payer: Medicare Other | Admitting: Hospice

## 2019-06-30 ENCOUNTER — Other Ambulatory Visit: Payer: Self-pay

## 2019-06-30 DIAGNOSIS — Z515 Encounter for palliative care: Secondary | ICD-10-CM

## 2019-06-30 DIAGNOSIS — G308 Other Alzheimer's disease: Secondary | ICD-10-CM

## 2019-06-30 NOTE — Progress Notes (Signed)
Designer, jewellery Palliative Care Consult Note Telephone: [phone]  Fax: [phone] PATIENT NAME: Bonnie Mora DOB: 01-23-1926 MRN: [id number]  PRIMARY CARE PROVIDER:   Harlan Stains, MD  REFERRING PROVIDER:White, Caren Griffins, MD [address] [place], [place] [address]  RESPONSIBLE PARTY:Son Oxford [phone] Patient lives at home withcaregiver [phone] Candy: [place] VISIT STATEMENT Due to the COVID-19 crisis, this visit was done via telephone from my office. It was initiated and consented to by this patient and/or family  RECOMMENDATIONS/PLAN:  Advance Care Planning/Goals of Care: Visit is to build trust and to follow-up on palliative care. Patientis a DNR, form at home; uploaded in [place].Goals of care include to maximize quality of life and symptom management.  Spoke with [name], patient and caregiver Candy: Visit consisted of counseling and education dealing with the complex and emotionally intense issues of symptom management and palliative care in the setting of serious and potentially life-threatening illness.  In person visit scheduled for next month to further discuss goals of care clarification.  Palliative care team will continue to support patient, patient's family, and medical team. Symptom management:Patient  recently saw her urologist who placed her on cephalexin to 50 mg daily for UTI prophylaxis.  No reported adverse reaction. Patient denies any urinary symptoms at this time.  Her appetite is good and no weight changes per caregiver. Memory loss and confusion is ongoing related toDementia  FAST 6d, incontinent of bowel and bladder;requiring assistance/guidance to dress properly. Still ambulatory with rolling walker.Patientiscompliant with her medications. Patient denies pain/discomfort.  Caregiver Candy and son [name] with no concerns at this time.  Ongoing  supportive care encouraged. Follow OI:NOMVEHMCNO care will continue to follow patient for goals of care clarification and symptom management. I spent71minutes providing this consultation, time includes chart review and documentation.More than 50% of the time in this consultation was spent on coordinating communication  [place] a 86 y.o.year oldfemalewith multiple medical problems including Alzheimer's dementia, recurrent UTIs, HLD, HTN, anxiety. Palliative Care was asked to help address goals of care CODE STATUS: DNR  PPS: 50% HOSPICE ELIGIBILITY/DIAGNOSIS: TBD  PAST MEDICAL HISTORY:  Past Medical History:  Diagnosis Date  . Dementia ([place])   . Hyperlipidemia   . Hypertension   . Parkinson disease ([place])     SOCIAL HX:  Social History   Tobacco Use  . Smoking status: Never Smoker  . Smokeless tobacco: Never Used  Substance Use Topics  . Alcohol use: No    Alcohol/week: 0.0 standard drinks    ALLERGIES:  Allergies  Allergen Reactions  . Codeine Nausea And Vomiting     PERTINENT MEDICATIONS:  Outpatient Encounter Medications as of 06/30/2019  Medication Sig  . acetaminophen (TYLENOL) 325 MG tablet Take 2 tablets (650 mg total) by mouth every 6 (six) hours as needed for mild pain (or Fever >/= 101).  Marland Kitchen ALPRAZolam (XANAX) 0.5 MG tablet Take 1 tablet (0.5 mg total) by mouth 2 (two) times daily as needed for anxiety.  Marland Kitchen aspirin 325 MG tablet Take 1 tablet (325 mg total) by mouth daily.  Marland Kitchen atorvastatin (LIPITOR) 20 MG tablet TAKE 1 TABLET AT 6PM FOR CHOLESTEROL. (Patient taking differently: Take 20 mg by mouth at bedtime. )  . citalopram (CELEXA) 20 MG tablet Take 1 tablet (20 mg total) by mouth daily.  . metoprolol succinate (TOPROL-XL) 50 MG 24 hr tablet Take 1 tablet (50 mg total) by mouth  daily with breakfast.  . nystatin cream (MYCOSTATIN) Apply to affected area 2 times daily (Patient taking differently: 1 application 2 (two)  times daily as needed for dry skin. )  . omeprazole (PRILOSEC) 20 MG capsule Take 20 mg by mouth daily.  . polyethylene glycol (MIRALAX / GLYCOLAX) packet Take 17 g by mouth daily as needed for mild constipation.  . traZODone (DESYREL) 100 MG tablet Take 1 tablet (100 mg total) by mouth at bedtime. (Patient taking differently: Take 200 mg by mouth at bedtime. )  . trimethoprim (TRIMPEX) 100 MG tablet Take 100 mg by mouth at bedtime.   No facility-administered encounter medications on file as of 06/30/2019.     [name], NP

## 2019-07-23 ENCOUNTER — Emergency Department (HOSPITAL_BASED_OUTPATIENT_CLINIC_OR_DEPARTMENT_OTHER)
Admission: EM | Admit: 2019-07-23 | Discharge: 2019-07-23 | Disposition: A | Discharge status: Home / Self Care | Payer: Medicare Other | Attending: Emergency Medicine | Admitting: Emergency Medicine

## 2019-07-23 ENCOUNTER — Emergency Department (HOSPITAL_BASED_OUTPATIENT_CLINIC_OR_DEPARTMENT_OTHER): Payer: Medicare Other

## 2019-07-23 ENCOUNTER — Other Ambulatory Visit: Payer: Self-pay

## 2019-07-23 ENCOUNTER — Encounter (HOSPITAL_BASED_OUTPATIENT_CLINIC_OR_DEPARTMENT_OTHER): Payer: Self-pay | Admitting: Emergency Medicine

## 2019-07-23 DIAGNOSIS — Z79899 Other long term (current) drug therapy: Secondary | ICD-10-CM | POA: Diagnosis not present

## 2019-07-23 DIAGNOSIS — R05 Cough: Secondary | ICD-10-CM | POA: Diagnosis not present

## 2019-07-23 DIAGNOSIS — G20 Parkinson's disease: Secondary | ICD-10-CM | POA: Diagnosis not present

## 2019-07-23 DIAGNOSIS — I10 Essential (primary) hypertension: Secondary | ICD-10-CM | POA: Diagnosis not present

## 2019-07-23 DIAGNOSIS — B372 Candidiasis of skin and nail: Secondary | ICD-10-CM | POA: Diagnosis not present

## 2019-07-23 DIAGNOSIS — N3000 Acute cystitis without hematuria: Secondary | ICD-10-CM | POA: Insufficient documentation

## 2019-07-23 DIAGNOSIS — N179 Acute kidney failure, unspecified: Secondary | ICD-10-CM

## 2019-07-23 DIAGNOSIS — Z7982 Long term (current) use of aspirin: Secondary | ICD-10-CM | POA: Insufficient documentation

## 2019-07-23 DIAGNOSIS — F0390 Unspecified dementia without behavioral disturbance: Secondary | ICD-10-CM | POA: Insufficient documentation

## 2019-07-23 DIAGNOSIS — R4182 Altered mental status, unspecified: Secondary | ICD-10-CM | POA: Diagnosis present

## 2019-07-23 DIAGNOSIS — Z20822 Contact with and (suspected) exposure to covid-19: Secondary | ICD-10-CM | POA: Diagnosis not present

## 2019-07-23 DIAGNOSIS — Z96653 Presence of artificial knee joint, bilateral: Secondary | ICD-10-CM | POA: Insufficient documentation

## 2019-07-23 LAB — COMPREHENSIVE METABOLIC PANEL
ALT: 19 U/L (ref 0–44)
AST: 19 U/L (ref 15–41)
Albumin: 3.5 g/dL (ref 3.5–5.0)
Alkaline Phosphatase: 55 U/L (ref 38–126)
Anion gap: 8 (ref 5–15)
BUN: 25 mg/dL — ABNORMAL HIGH (ref 8–23)
CO2: 29 mmol/L (ref 22–32)
Calcium: 9.1 mg/dL (ref 8.9–10.3)
Chloride: 105 mmol/L (ref 98–111)
Creatinine, Ser: 1.47 mg/dL — ABNORMAL HIGH (ref 0.44–1.00)
GFR calc Af Amer: 36 mL/min — ABNORMAL LOW (ref >60)
GFR calc non Af Amer: 31 mL/min — ABNORMAL LOW (ref >60)
Glucose, Bld: 81 mg/dL (ref 70–99)
Potassium: 5 mmol/L (ref 3.5–5.1)
Sodium: 142 mmol/L (ref 135–145)
Total Bilirubin: 0.7 mg/dL (ref 0.3–1.2)
Total Protein: 6.7 g/dL (ref 6.5–8.1)

## 2019-07-23 LAB — CBC WITH DIFFERENTIAL/PLATELET
Abs Immature Granulocytes: 0.38 10*3/uL — ABNORMAL HIGH (ref 0.00–0.07)
Basophils Absolute: 0.1 10*3/uL (ref 0.0–0.1)
Basophils Relative: 1 %
Eosinophils Absolute: 0.1 10*3/uL (ref 0.0–0.5)
Eosinophils Relative: 1 %
HCT: 37 % (ref 36.0–46.0)
Hemoglobin: 11.6 g/dL — ABNORMAL LOW (ref 12.0–15.0)
Immature Granulocytes: 5 %
Lymphocytes Relative: 26 %
Lymphs Abs: 2 10*3/uL (ref 0.7–4.0)
MCH: 30.5 pg (ref 26.0–34.0)
MCHC: 31.4 g/dL (ref 30.0–36.0)
MCV: 97.4 fL (ref 80.0–100.0)
Monocytes Absolute: 0.8 10*3/uL (ref 0.1–1.0)
Monocytes Relative: 10 %
Neutro Abs: 4.4 10*3/uL (ref 1.7–7.7)
Neutrophils Relative %: 57 %
Platelets: 195 10*3/uL (ref 150–400)
RBC: 3.8 MIL/uL — ABNORMAL LOW (ref 3.87–5.11)
RDW: 13.5 % (ref 11.5–15.5)
WBC: 7.7 10*3/uL (ref 4.0–10.5)
nRBC: 0 % (ref 0.0–0.2)

## 2019-07-23 LAB — URINALYSIS, ROUTINE W REFLEX MICROSCOPIC
Bilirubin Urine: NEGATIVE
Glucose, UA: NEGATIVE mg/dL
Ketones, ur: NEGATIVE mg/dL
Nitrite: POSITIVE — AB
Protein, ur: NEGATIVE mg/dL
Specific Gravity, Urine: >1.03 — ABNORMAL HIGH (ref 1.005–1.030)
pH: 5.5 (ref 5.0–8.0)

## 2019-07-23 LAB — URINALYSIS, MICROSCOPIC (REFLEX): WBC, UA: >50 WBC/hpf (ref 0–5)

## 2019-07-23 LAB — SARS CORONAVIRUS 2 BY RT PCR (HOSPITAL ORDER, PERFORMED IN ~~LOC~~ HOSPITAL LAB): SARS Coronavirus 2: NEGATIVE

## 2019-07-23 MED ORDER — FLUCONAZOLE 100 MG PO TABS: 100.0000 mg | ORAL_TABLET | ORAL | 0 refills | Status: AC

## 2019-07-23 MED ORDER — FLUCONAZOLE 100 MG PO TABS: 100.0000 mg | ORAL_TABLET | ORAL | 0 refills | Status: DC

## 2019-07-23 MED ORDER — CEFDINIR 300 MG PO CAPS: 300.0000 mg | ORAL_CAPSULE | Freq: Two times a day (BID) | ORAL | 0 refills | Status: DC

## 2019-07-23 MED ORDER — CEFDINIR 300 MG PO CAPS: 300.0000 mg | ORAL_CAPSULE | Freq: Two times a day (BID) | ORAL | 0 refills | Status: AC

## 2019-07-23 NOTE — ED Notes (Signed)
Not enough urine for both, so just sent UA, collected by Gaspar Garbe EMT

## 2019-07-23 NOTE — ED Notes (Signed)
AVS and Rx written provided to pt, opportunity for questions provided. Caregiver with pt

## 2019-07-23 NOTE — ED Provider Notes (Signed)
[facility] EMERGENCY DEPARTMENT Provider Note   CSN: [id number] Arrival date & time: 07/23/19  1111     History Chief Complaint  Patient presents with  . Vaginal Irritation    Bonnie Mora is a 86 y.o. female.  HPI      86yo female with history of htn, hlpd, dementia, Parkinson's disease presents with concern for altered mental status.  Yesterday " a little sassy" but today has been more confused, holding out hand as if something in it when there is not, reaching up and grabbing at things that are not there. Hasn't slept for 2 nights. Over the past month has been living with caregiver and doing well. Eating and drinking well Coughing a little, needs to pick up acid reflux medication No shortness of breath No fevers No trauma, falls headache No diarrhea or black or bloody stools Last BM was Thursday, normal No abd pain/chest pain  Has rash that found today, could tell something irritating her yesterday Put monistat on rash this AM    Past Medical History:  Diagnosis Date  . Dementia (HCC)   . Hyperlipidemia   . Hypertension   . Parkinson disease [facility])     Patient Active Problem List   Diagnosis Date Noted  . CAP (community acquired pneumonia) 09/20/2018  . UTI due to extended-spectrum beta lactamase (ESBL) producing Escherichia coli 04/13/2017  . Complicated UTI (urinary tract infection)   . Confusion   . Hx of completed stroke 03/08/2017  . Acute encephalopathy 03/06/2017  . Chronic left shoulder pain 08/19/2016  . Routine general medical examination at a health care facility 04/25/2016  . Dementia (HCC) 03/10/2014  . HLD (hyperlipidemia) 03/10/2014  . Essential hypertension 03/10/2014    Past Surgical History:  Procedure Laterality Date  . APPENDECTOMY    . JOINT REPLACEMENT     bilateral knee  . OVARIAN CYST SURGERY    . TONSILLECTOMY AND ADENOIDECTOMY       OB History   No obstetric history on file.     Family History  Problem  Relation Age of Onset  . ALS Mother   . Heart disease Father   . Heart disease Maternal Grandfather   . Cancer Maternal Grandfather        breast    Social History   Tobacco Use  . Smoking status: Never Smoker  . Smokeless tobacco: Never Used  Substance Use Topics  . Alcohol use: No    Alcohol/week: 0.0 standard drinks  . Drug use: No    Home Medications Prior to Admission medications   Medication Sig Start Date End Date Taking? Authorizing Provider  acetaminophen (TYLENOL) 325 MG tablet Take 2 tablets (650 mg total) by mouth every 6 (six) hours as needed for mild pain (or Fever >/= 101). 04/17/17   Emokpae, Courage, MD  ALPRAZolam (XANAX) 0.5 MG tablet Take 1 tablet (0.5 mg total) by mouth 2 (two) times daily as needed for anxiety. 04/17/17   Shon Hale, MD  aspirin 325 MG tablet Take 1 tablet (325 mg total) by mouth daily. 03/10/17   [name], MD  atorvastatin (LIPITOR) 20 MG tablet TAKE 1 TABLET AT 6PM FOR CHOLESTEROL. Patient taking differently: Take 20 mg by mouth at bedtime.  07/20/17   [name], MD  cefdinir (OMNICEF) 300 MG capsule Take 1 capsule (300 mg total) by mouth 2 (two) times daily for 10 days. 07/23/19 08/02/19  Lorelee New, PA-C  citalopram (CELEXA) 20 MG tablet Take  1 tablet (20 mg total) by mouth daily. 04/17/17   Shon Hale, MD  fluconazole (DIFLUCAN) 100 MG tablet Take 1 tablet (100 mg total) by mouth every 3 (three) days for 9 days. 07/23/19 08/01/19  Lorelee New, PA-C  metoprolol succinate (TOPROL-XL) 50 MG 24 hr tablet Take 1 tablet (50 mg total) by mouth daily with breakfast. 04/17/17   Shon Hale, MD  nystatin cream (MYCOSTATIN) Apply to affected area 2 times daily Patient taking differently: 1 application 2 (two) times daily as needed for dry skin.  10/15/17   [name], MD  omeprazole (PRILOSEC) 20 MG capsule Take 20 mg by mouth daily.    [provider]  polyethylene glycol (MIRALAX / GLYCOLAX) packet Take 17  g by mouth daily as needed for mild constipation. 03/10/17   [name], MD  traZODone (DESYREL) 100 MG tablet Take 1 tablet (100 mg total) by mouth at bedtime. Patient taking differently: Take 200 mg by mouth at bedtime.  04/25/16   [name], MD  trimethoprim (TRIMPEX) 100 MG tablet Take 100 mg by mouth at bedtime.    [provider]    Allergies    Codeine  Review of Systems   Review of Systems  Unable to perform ROS: Dementia  Constitutional: Negative for fever.  HENT: Negative for sore throat.   Eyes: Negative for visual disturbance.  Respiratory: Positive for cough. Negative for shortness of breath.   Cardiovascular: Negative for chest pain.  Gastrointestinal: Negative for abdominal pain, diarrhea (pt states diararhea but caregiver denies) and vomiting.  Genitourinary: Negative for difficulty urinating.  Skin: Positive for rash.  Neurological: Negative for syncope, weakness and numbness.  Psychiatric/Behavioral: Positive for hallucinations (per caregiver).    Physical Exam Updated Vital Signs BP 124/60 (BP Location: Right Arm)   Pulse 60   Temp 98.5 F (36.9 C) (Oral)   Resp 18   Ht 5\' 3"  (1.6 m)   Wt 64.9 kg   SpO2 95%   BMI 25.33 kg/m   Physical Exam Vitals and nursing note reviewed.  Constitutional:      General: She is not in acute distress.    Appearance: She is well-developed. She is not diaphoretic.  HENT:     Head: Normocephalic and atraumatic.  Eyes:     Conjunctiva/sclera: Conjunctivae normal.  Cardiovascular:     Rate and Rhythm: Normal rate and regular rhythm.     Heart sounds: Normal heart sounds. No murmur heard.  No friction rub. No gallop.   Pulmonary:     Effort: Pulmonary effort is normal. No respiratory distress.     Breath sounds: Normal breath sounds. No wheezing or rales.  Abdominal:     General: There is no distension.     Palpations: Abdomen is soft.     Tenderness: There is no abdominal tenderness. There is  no guarding.  Musculoskeletal:        General: No tenderness.     Cervical back: Normal range of motion.  Skin:    General: Skin is warm and dry.     Findings: Rash (erythema perineum satellite lesions) present. No erythema.  Neurological:     Mental Status: She is alert.     Sensory: No sensory deficit.     Motor: No weakness.     Comments: Oriented to self, reports at [place] (at [facility])     ED Results / Procedures / Treatments   Labs (all labs ordered are listed, but only abnormal  results are displayed) Labs Reviewed  URINALYSIS, ROUTINE W REFLEX MICROSCOPIC - Abnormal; Notable for the following components:      Result Value   APPearance CLOUDY (*)    Specific Gravity, Urine >1.030 (*)    Hgb urine dipstick TRACE (*)    Nitrite POSITIVE (*)    Leukocytes,Ua MODERATE (*)    All other components within normal limits  CBC WITH DIFFERENTIAL/PLATELET - Abnormal; Notable for the following components:   RBC 3.80 (*)    Hemoglobin 11.6 (*)    Abs Immature Granulocytes 0.38 (*)    All other components within normal limits  COMPREHENSIVE METABOLIC PANEL - Abnormal; Notable for the following components:   BUN 25 (*)    Creatinine, Ser 1.47 (*)    GFR calc non Af Amer 31 (*)    GFR calc Af Amer 36 (*)    All other components within normal limits  URINALYSIS, MICROSCOPIC (REFLEX) - Abnormal; Notable for the following components:   Bacteria, UA MANY (*)    All other components within normal limits  SARS CORONAVIRUS 2 BY RT PCR [facility](HOSPITAL ORDER, PERFORMED IN [place] [facility])  URINE CULTURE    EKG EKG Interpretation  Date/Time:  Saturday July 23 2019 12:12:52 EDT Ventricular Rate:  58 PR Interval:    QRS Duration: 90 QT Interval:  448 QTC Calculation: 440 R Axis:   18 Text Interpretation: Sinus or ectopic atrial rhythm Abnormal R-wave progression, early transition No significant change since last tracing Confirmed by Alvira MondaySchlossman, Julie-Anne Torain ([id number]) on 07/23/2019 1:24:12  PM   Radiology DG Chest Portable 1 View  Result Date: 07/23/2019 CLINICAL DATA:  Shortness of breath. Vaginal redness and irritation concern for UTI EXAM: PORTABLE CHEST 1 VIEW COMPARISON:  03/14/2019 FINDINGS: Trachea is midline. Heart size remains enlarged. Calcified atheromatous plaque of the thoracic aorta. Patchy areas of opacities with some areas of linear opacity in the LEFT lung base largely similar to the study of 03/14/2019. No overt signs of pleural effusion. On limited assessment visualized skeletal structures are unremarkable. IMPRESSION: Patchy areas of opacities with areas of linear opacity in the LEFT lung base, similar to 03/14/2019. Findings likely represent scarring or atelectasis given little change since February. Electronically Signed   By: Donzetta KohutGeoffrey  Wile M.D.   On: 07/23/2019 12:53    Procedures Procedures (including critical care time)  Medications Ordered in ED Medications - No data to display  ED Course  I have reviewed the triage vital signs and the nursing notes.  Pertinent labs & imaging results that were available during my care of the patient were reviewed by me and considered in my medical decision making (see chart for details).    MDM Rules/Calculators/A&P                          86yo female with history of htn, hlpd, dementia, Parkinson's disease presents with concern for altered mental status with caregiver. Reports behavior changes similar to when she has had UTI in the past.  No falls, trauma, no focal neurologic findings and doubt CVA or ICH.  Mild cough, CXR without acute findings. COVID19 testing negative.  Labs without significant electrolyte abnormalities. Mild AKI in comparison to prior. Recommend po hydration. Vital signs WNL.    UA with infection--on chronic 50mg  keflex, however will give rx for cefdinir and send for urine culture. Given fluconazole for candidal dermatitis.    Recommend close follow up with PCP, recheck labs, return  to ED for  new or worsening symptoms. Patient discharged in stable condition with understanding of reasons to return.    Final Clinical Impression(s) / ED Diagnoses Final diagnoses:  Acute cystitis without hematuria  Candidal skin infection  Acute kidney injury (HCC)    Rx / DC Orders ED Discharge Orders         Ordered    cefdinir (OMNICEF) 300 MG capsule  2 times daily,   Status:  Discontinued     Reprint     07/23/19 1443    fluconazole (DIFLUCAN) 100 MG tablet  Every 3 DAYS,   Status:  Discontinued     Reprint     07/23/19 1446    cefdinir (OMNICEF) 300 MG capsule  2 times daily     Discontinue  Reprint     07/23/19 1456    fluconazole (DIFLUCAN) 100 MG tablet  Every 3 DAYS     Discontinue  Reprint     07/23/19 1456           Alvira Monday, MD 07/23/19 2141

## 2019-07-23 NOTE — ED Notes (Signed)
Presents today with possible UTI, noted to have a lot of redness at vaginal area, caregiver has placed monistat at site. Having normal urination pattern. No fevers noted.

## 2019-07-23 NOTE — ED Triage Notes (Signed)
Pt presents with caregiver with c/o of vaginal redness and irritation. Also voices concern for UTI. Pt/caregiver denies dysuria, denies fever

## 2019-07-25 ENCOUNTER — Other Ambulatory Visit: Payer: Self-pay

## 2019-07-25 ENCOUNTER — Other Ambulatory Visit: Payer: Medicare Other | Admitting: Hospice

## 2019-07-25 DIAGNOSIS — Z515 Encounter for palliative care: Secondary | ICD-10-CM

## 2019-07-25 DIAGNOSIS — G308 Other Alzheimer's disease: Secondary | ICD-10-CM

## 2019-07-25 LAB — URINE CULTURE: Culture: >=100000 — AB

## 2019-07-25 NOTE — Progress Notes (Signed)
Therapist, nutritional Palliative Care Consult Note Telephone: [phone]  Fax: [phone] PATIENT NAME:Bonnie Mora DOB:1926-02-02 YKD:[id number]  PRIMARY CARE PROVIDER:White, Aram Beecham, MD REFERRING PROVIDER:White, Aram Beecham, MD [phone] Bonnie Mora [address] [place], [place] [address]  RESPONSIBLE PARTY:Son Bonnie Mora [phone] Patient lives at home withCandy  [phone] Bonnie Mora: [phone]    RECOMMENDATIONS/PLAN:  Advance Care Planning/Goals of Care: Visit is to build trust and to follow-up on palliative care. Patientis a DNR, form at home; uploaded in Epic.Goals of care include to maximize quality of life and symptom management.  Visit consisted of counseling and education dealing with the complex and emotionally intense issues of symptom management and palliative care in the setting of serious and potentially life-threatening illness.  Broached discussion on use of MOST form for advance care planning. In person visit scheduled for next month to further discuss goals of care clarification; Bonnie Mora and Bonnie Mora be expected to be present. Palliative care team will continue to support patient, patient's family, and medical team. Symptom management:Patient seen in the ED 07/23/2019 for vaginal irritation and subsequent dx of acute cystitis without hematuria and candidal skin infection. Patient placed on Cefdinir 300mg  BID x 10days and Diflucan 100mg  every 3 days for 9 days. Appointtment with [facility]  08/10/2019.Patient denies any vaginal itching/urinary symptoms at this time. Her appetite is good and no weight changes,  percaregiver Bonnie Mora.Memory loss and confusion is ongoing related toDementia  FAST 6d, incontinent of bowel and bladder;requiring assistance/guidance to dress properly. Still ambulatory with rolling walker.Patientiscompliant with her medications.Patient denies pain/discomfort. Patient continues with  exercises and bicycle pedals supervised by caregiver Bonnie Mora. Patient denies pain/discomfort.  FLACC 0.  Caregiver with no concerns at this time. Ongoing supportive care encouraged. Follow care will continue to follow patient for goals of care clarification and symptom management. I spent 1 hour and 16 minutes providing this consultation,time includes chart review and documentation.More than 50% of the time in this consultation was spent on coordinating communication  HISTORY OF PRESENT ILLNESS:Bonnie J Coxis a 86 y.o.year oldfemalewith multiple medical problems including Alzheimer's dementia, recurrent UTIs, HLD, HTN, anxiety. Palliative Care was asked to help address goals of care CODE STATUS:DNR  PPS:50% HOSPICE ELIGIBILITY/DIAGNOSIS: TBD  PAST MEDICAL HISTORY:  Past Medical History:  Diagnosis Date  . Dementia (HCC)   . Hyperlipidemia   . Hypertension   . Parkinson disease (HCC)     SOCIAL HX:  Social History   Tobacco Use  . Smoking status: Never Smoker  . Smokeless tobacco: Never Used  Substance Use Topics  . Alcohol use: No    Alcohol/week: 0.0 standard drinks    ALLERGIES:  Allergies  Allergen Reactions  . Codeine Nausea And Vomiting     PERTINENT MEDICATIONS:  Outpatient Encounter Medications as of 07/25/2019  Medication Sig  . acetaminophen (TYLENOL) 325 MG tablet Take 2 tablets (650 mg total) by mouth every 6 (six) hours as needed for mild pain (or Fever >/= 101).  95 ALPRAZolam (XANAX) 0.5 MG tablet Take 1 tablet (0.5 mg total) by mouth 2 (two) times daily as needed for anxiety.  09/25/2019 aspirin 325 MG tablet Take 1 tablet (325 mg total) by mouth daily.  Marland Kitchen atorvastatin (LIPITOR) 20 MG tablet TAKE 1 TABLET AT 6PM FOR CHOLESTEROL. (Patient taking differently: Take 20 mg by mouth at bedtime. )  . cefdinir (OMNICEF) 300 MG capsule Take 1 capsule (300 mg total) by mouth 2 (two) times daily for 10 days.  Marland Kitchen  citalopram (CELEXA) 20 MG tablet Take 1  tablet (20 mg total) by mouth daily.  . fluconazole (DIFLUCAN) 100 MG tablet Take 1 tablet (100 mg total) by mouth every 3 (three) days for 9 days.  . metoprolol succinate (TOPROL-XL) 50 MG 24 hr tablet Take 1 tablet (50 mg total) by mouth daily with breakfast.  . nystatin cream (MYCOSTATIN) Apply to affected area 2 times daily (Patient taking differently: 1 application 2 (two) times daily as needed for dry skin. )  . omeprazole (PRILOSEC) 20 MG capsule Take 20 mg by mouth daily.  . polyethylene glycol (MIRALAX / GLYCOLAX) packet Take 17 g by mouth daily as needed for mild constipation.  . traZODone (DESYREL) 100 MG tablet Take 1 tablet (100 mg total) by mouth at bedtime. (Patient taking differently: Take 200 mg by mouth at bedtime. )  . trimethoprim (TRIMPEX) 100 MG tablet Take 100 mg by mouth at bedtime.   No facility-administered encounter medications on file as of 07/25/2019.    PHYSICAL EXAM/ROS:  General: NAD, cooperative Cardiovascular: regular rate and rhythm; denies chest pain Pulmonary: clear ant fields; no adventitious sounds auscultated, no shortness of breath Abdomen: soft, nontender, + bowel sounds GU: no suprapubic tenderness Extremities: no edema, no joint deformities Skin: no rashes to exposed skin Neurological: Weakness but otherwise nonfocal; forgetful  [name], NP

## 2019-07-29 ENCOUNTER — Emergency Department (HOSPITAL_COMMUNITY): Payer: Medicare Other

## 2019-07-29 ENCOUNTER — Emergency Department (HOSPITAL_COMMUNITY)
Admission: EM | Admit: 2019-07-29 | Discharge: 2019-07-30 | Disposition: A | Discharge status: Home / Self Care | Payer: Medicare Other | Attending: Emergency Medicine | Admitting: Emergency Medicine

## 2019-07-29 ENCOUNTER — Other Ambulatory Visit: Payer: Self-pay

## 2019-07-29 ENCOUNTER — Encounter (HOSPITAL_COMMUNITY): Payer: Self-pay

## 2019-07-29 DIAGNOSIS — R112 Nausea with vomiting, unspecified: Secondary | ICD-10-CM

## 2019-07-29 DIAGNOSIS — F0390 Unspecified dementia without behavioral disturbance: Secondary | ICD-10-CM | POA: Diagnosis not present

## 2019-07-29 DIAGNOSIS — R638 Other symptoms and signs concerning food and fluid intake: Secondary | ICD-10-CM | POA: Diagnosis not present

## 2019-07-29 DIAGNOSIS — N3090 Cystitis, unspecified without hematuria: Secondary | ICD-10-CM | POA: Insufficient documentation

## 2019-07-29 DIAGNOSIS — R05 Cough: Secondary | ICD-10-CM | POA: Insufficient documentation

## 2019-07-29 DIAGNOSIS — S80821A Blister (nonthermal), right lower leg, initial encounter: Secondary | ICD-10-CM | POA: Insufficient documentation

## 2019-07-29 DIAGNOSIS — Z79899 Other long term (current) drug therapy: Secondary | ICD-10-CM | POA: Diagnosis not present

## 2019-07-29 DIAGNOSIS — Y929 Unspecified place or not applicable: Secondary | ICD-10-CM | POA: Insufficient documentation

## 2019-07-29 DIAGNOSIS — N3000 Acute cystitis without hematuria: Secondary | ICD-10-CM

## 2019-07-29 DIAGNOSIS — R5383 Other fatigue: Secondary | ICD-10-CM | POA: Diagnosis not present

## 2019-07-29 DIAGNOSIS — X58XXXA Exposure to other specified factors, initial encounter: Secondary | ICD-10-CM | POA: Insufficient documentation

## 2019-07-29 DIAGNOSIS — I10 Essential (primary) hypertension: Secondary | ICD-10-CM | POA: Insufficient documentation

## 2019-07-29 DIAGNOSIS — Y999 Unspecified external cause status: Secondary | ICD-10-CM | POA: Insufficient documentation

## 2019-07-29 DIAGNOSIS — N63 Unspecified lump in unspecified breast: Secondary | ICD-10-CM | POA: Insufficient documentation

## 2019-07-29 DIAGNOSIS — Z7982 Long term (current) use of aspirin: Secondary | ICD-10-CM | POA: Insufficient documentation

## 2019-07-29 DIAGNOSIS — Y939 Activity, unspecified: Secondary | ICD-10-CM | POA: Diagnosis not present

## 2019-07-29 DIAGNOSIS — R531 Weakness: Secondary | ICD-10-CM | POA: Diagnosis present

## 2019-07-29 LAB — COMPREHENSIVE METABOLIC PANEL
ALT: 21 U/L (ref 0–44)
AST: 26 U/L (ref 15–41)
Albumin: 3.8 g/dL (ref 3.5–5.0)
Alkaline Phosphatase: 64 U/L (ref 38–126)
Anion gap: 12 (ref 5–15)
BUN: 18 mg/dL (ref 8–23)
CO2: 27 mmol/L (ref 22–32)
Calcium: 9.7 mg/dL (ref 8.9–10.3)
Chloride: 99 mmol/L (ref 98–111)
Creatinine, Ser: 1.06 mg/dL — ABNORMAL HIGH (ref 0.44–1.00)
GFR calc Af Amer: 53 mL/min — ABNORMAL LOW (ref >60)
GFR calc non Af Amer: 46 mL/min — ABNORMAL LOW (ref >60)
Glucose, Bld: 135 mg/dL — ABNORMAL HIGH (ref 70–99)
Potassium: 5.5 mmol/L — ABNORMAL HIGH (ref 3.5–5.1)
Sodium: 138 mmol/L (ref 135–145)
Total Bilirubin: 0.5 mg/dL (ref 0.3–1.2)
Total Protein: 7.6 g/dL (ref 6.5–8.1)

## 2019-07-29 LAB — URINALYSIS, ROUTINE W REFLEX MICROSCOPIC
Bilirubin Urine: NEGATIVE
Glucose, UA: NEGATIVE mg/dL
Hgb urine dipstick: NEGATIVE
Ketones, ur: 5 mg/dL — AB
Nitrite: POSITIVE — AB
Protein, ur: NEGATIVE mg/dL
Specific Gravity, Urine: 1.012 (ref 1.005–1.030)
pH: 5 (ref 5.0–8.0)

## 2019-07-29 LAB — CBC
HCT: 41.1 % (ref 36.0–46.0)
Hemoglobin: 13.1 g/dL (ref 12.0–15.0)
MCH: 31.2 pg (ref 26.0–34.0)
MCHC: 31.9 g/dL (ref 30.0–36.0)
MCV: 97.9 fL (ref 80.0–100.0)
Platelets: 173 10*3/uL (ref 150–400)
RBC: 4.2 MIL/uL (ref 3.87–5.11)
RDW: 13.6 % (ref 11.5–15.5)
WBC: 6.8 10*3/uL (ref 4.0–10.5)
nRBC: 0 % (ref 0.0–0.2)

## 2019-07-29 LAB — POTASSIUM: Potassium: 4.5 mmol/L (ref 3.5–5.1)

## 2019-07-29 LAB — LIPASE, BLOOD: Lipase: 28 U/L (ref 11–51)

## 2019-07-29 MED ORDER — IOHEXOL 300 MG/ML  SOLN
100.0000 mL | Freq: Once | INTRAMUSCULAR | Status: AC | PRN
  Administered 2019-07-29: 100 mL via INTRAVENOUS

## 2019-07-29 MED ORDER — ONDANSETRON 4 MG PO TBDP: 4.0000 mg | ORAL_TABLET | Freq: Three times a day (TID) | ORAL | 0 refills | Status: AC | PRN

## 2019-07-29 MED ORDER — LORAZEPAM 2 MG/ML IJ SOLN
0.5000 mg | Freq: Once | INTRAMUSCULAR | Status: AC
  Administered 2019-07-29: 0.5 mg via INTRAVENOUS
  Filled 2019-07-29: qty 1

## 2019-07-29 MED ORDER — ONDANSETRON HCL 4 MG/2ML IJ SOLN
4.0000 mg | Freq: Once | INTRAMUSCULAR | Status: AC
  Administered 2019-07-29: 4 mg via INTRAVENOUS
  Filled 2019-07-29: qty 2

## 2019-07-29 MED ORDER — SODIUM CHLORIDE 0.9 % IV SOLN
1.0000 g | Freq: Once | INTRAVENOUS | Status: AC
  Administered 2019-07-30: 1 g via INTRAVENOUS
  Filled 2019-07-29: qty 10

## 2019-07-29 MED ORDER — SODIUM CHLORIDE 0.9% FLUSH: 3.0000 mL | Freq: Once | INTRAVENOUS | Status: DC

## 2019-07-29 MED ORDER — SULFAMETHOXAZOLE-TRIMETHOPRIM 800-160 MG PO TABS: 1.0000 | ORAL_TABLET | Freq: Two times a day (BID) | ORAL | 0 refills | Status: AC

## 2019-07-29 MED ORDER — SODIUM CHLORIDE 0.9 % IV BOLUS
500.0000 mL | Freq: Once | INTRAVENOUS | Status: AC
  Administered 2019-07-29: 500 mL via INTRAVENOUS

## 2019-07-29 NOTE — ED Provider Notes (Signed)
[facility]-EMERGENCY DEPT Provider Note   CSN: [id number] Arrival date & time: 07/29/19  1342    History Chief Complaint  Patient presents with  . Emesis    Bonnie Mora is a 86 y.o. female with history of dementia, Parkinson's, hypertension, hyperlipidemia who presents for evaluation of generalized weakness.  Patient seen at [facility] on 08/19/2019 for hallucinations consistent with her prior UTIs. Diagnosed with cystitis started on cefdinir.  Has been taking for 4 days.  Daughter states she thought patient was getting better however over the last 24 hours daughter states patient has been weak, had decreased p.o. intake.  States he did call urology and had 2 episodes of NBNB emesis prior to arrival to urology office and then proceeded to the emergency department.  Did have some darkened emesis or patient just had a few sips of Coke.  Daughter states patient has been complaining of burning with urination over the last 24 hours. Does have history of dementia with behavioral disturbances.  Is typically only able to walk minimally with walker as well as moderate assistance.  Last bowel movement 2 days ago without melena or bright blood per rectum. Daughter states this is consistent with her bowel movement regimen.  Patient lives with family.  Daughter states she does have a chronic cough which has not changed.  Has not had any fevers per family  Level 5 caveat-Dementia  Family in room state patient is at her baseline mentation however does appear lethargic.  HPI     Past Medical History:  Diagnosis Date  . Dementia (HCC)   . Hyperlipidemia   . Hypertension   . Parkinson disease [facility])     Patient Active Problem List   Diagnosis Date Noted  . CAP (community acquired pneumonia) 09/20/2018  . UTI due to extended-spectrum beta lactamase (ESBL) producing Escherichia coli 04/13/2017  . Complicated UTI (urinary tract infection)   . Confusion   . Hx of  completed stroke 03/08/2017  . Acute encephalopathy 03/06/2017  . Chronic left shoulder pain 08/19/2016  . Routine general medical examination at a health care facility 04/25/2016  . Dementia (HCC) 03/10/2014  . HLD (hyperlipidemia) 03/10/2014  . Essential hypertension 03/10/2014    Past Surgical History:  Procedure Laterality Date  . APPENDECTOMY    . JOINT REPLACEMENT     bilateral knee  . OVARIAN CYST SURGERY    . TONSILLECTOMY AND ADENOIDECTOMY       OB History   No obstetric history on file.     Family History  Problem Relation Age of Onset  . ALS Mother   . Heart disease Father   . Heart disease Maternal Grandfather   . Cancer Maternal Grandfather        breast    Social History   Tobacco Use  . Smoking status: Never Smoker  . Smokeless tobacco: Never Used  Vaping Use  . Vaping Use: Never used  Substance Use Topics  . Alcohol use: No    Alcohol/week: 0.0 standard drinks  . Drug use: No    Home Medications Prior to Admission medications   Medication Sig Start Date End Date Taking? Authorizing Provider  ALPRAZolam Prudy Feeler) 0.5 MG tablet Take 1 tablet (0.5 mg total) by mouth 2 (two) times daily as needed for anxiety. 04/17/17  Yes Emokpae, Courage, MD  aspirin 325 MG tablet Take 1 tablet (325 mg total) by mouth daily. 03/10/17  Yes [name], MD  atorvastatin (LIPITOR) 20  MG tablet TAKE 1 TABLET AT 6PM FOR CHOLESTEROL. Patient taking differently: Take 20 mg by mouth at bedtime.  07/20/17  Yes [name], MD  cefdinir (OMNICEF) 300 MG capsule Take 1 capsule (300 mg total) by mouth 2 (two) times daily for 10 days. 07/23/19 08/02/19 Yes Lorelee New, PA-C  citalopram (CELEXA) 20 MG tablet Take 1 tablet (20 mg total) by mouth daily. 04/17/17  Yes Emokpae, Courage, MD  MELATONIN PO Take 1 tablet by mouth at bedtime as needed (sleep). OTC product, dosage unknown   Yes [provider]  metoprolol succinate (TOPROL-XL) 50 MG 24 hr tablet Take 1  tablet (50 mg total) by mouth daily with breakfast. 04/17/17  Yes Emokpae, Courage, MD  nystatin cream (MYCOSTATIN) Apply to affected area 2 times daily Patient taking differently: 1 application 2 (two) times daily as needed for dry skin.  10/15/17  Yes Rice, Cicero Duck, MD  omeprazole (PRILOSEC) 20 MG capsule Take 20 mg by mouth daily.   Yes [provider]  polyethylene glycol (MIRALAX / GLYCOLAX) packet Take 17 g by mouth daily as needed for mild constipation. 03/10/17  Yes [name], MD  traZODone (DESYREL) 100 MG tablet Take 1 tablet (100 mg total) by mouth at bedtime. Patient taking differently: Take 200 mg by mouth at bedtime.  04/25/16  Yes [name], MD  fluconazole (DIFLUCAN) 100 MG tablet Take 1 tablet (100 mg total) by mouth every 3 (three) days for 9 days. Patient not taking: Reported on 07/29/2019 07/23/19 08/01/19  Lorelee New, PA-C  ondansetron (ZOFRAN ODT) 4 MG disintegrating tablet Take 1 tablet (4 mg total) by mouth every 8 (eight) hours as needed for nausea or vomiting. 07/29/19   Emile Kyllo A, PA-C  sulfamethoxazole-trimethoprim (BACTRIM DS) 800-160 MG tablet Take 1 tablet by mouth 2 (two) times daily for 7 days. 07/29/19 08/05/19  Ski Polich A, PA-C    Allergies    Codeine  Review of Systems   Review of Systems  Unable to perform ROS: Dementia  Constitutional: Positive for activity change, appetite change and fatigue. Negative for chills and diaphoresis.  HENT: Negative.   Respiratory: Positive for cough (Chronic). Negative for choking and shortness of breath.   Gastrointestinal: Positive for vomiting.  Skin: Negative.   Neurological: Positive for weakness (Generalized).  All other systems reviewed and are negative.  Physical Exam Updated Vital Signs BP (!) 174/76   Pulse (!) 107   Temp 97.8 F (36.6 C) (Axillary)   Resp 16   Ht 5\' 3"  (1.6 m)   Wt 64.9 kg   SpO2 93%   BMI 25.33 kg/m   Physical Exam Vitals and nursing note reviewed.   Constitutional:      General: She is not in acute distress.    Appearance: She is well-developed. She is not ill-appearing or toxic-appearing.     Comments: Pleasantly demented patient.  Sleepy however arousable to voice  HENT:     Head: Normocephalic and atraumatic.     Nose: Nose normal.     Mouth/Throat:     Mouth: Mucous membranes are dry.  Eyes:     Pupils: Pupils are equal, round, and reactive to light.  Cardiovascular:     Rate and Rhythm: Normal rate.     Pulses: Normal pulses.     Heart sounds: Normal heart sounds.  Pulmonary:     Effort: Pulmonary effort is normal. No respiratory distress.     Breath sounds: Normal breath sounds.  Abdominal:     General: Bowel sounds are normal. There is no distension.     Tenderness: There is no abdominal tenderness. There is no right CVA tenderness, left CVA tenderness, guarding or rebound.  Musculoskeletal:        General: Normal range of motion.     Cervical back: Normal range of motion.     Comments: Moves all 4 extremities without difficulty  Skin:    General: Skin is warm and dry.     Capillary Refill: Capillary refill takes 2 to 3 seconds.     Comments: Blood blister to right posterior calf.  No active bleeding, edema, erythema, warmth  Neurological:     Mental Status: Mental status is at baseline.     Comments: Alert to person, date of birth however not time. No facial droop.    ED Results / Procedures / Treatments   Labs (all labs ordered are listed, but only abnormal results are displayed) Labs Reviewed  COMPREHENSIVE METABOLIC PANEL - Abnormal; Notable for the following components:      Result Value   Potassium 5.5 (*)    Glucose, Bld 135 (*)    Creatinine, Ser 1.06 (*)    GFR calc non Af Amer 46 (*)    GFR calc Af Amer 53 (*)    All other components within normal limits  URINALYSIS, ROUTINE W REFLEX MICROSCOPIC - Abnormal; Notable for the following components:   Ketones, ur 5 (*)    Nitrite POSITIVE (*)     Leukocytes,Ua TRACE (*)    Bacteria, UA RARE (*)    All other components within normal limits  URINE CULTURE  LIPASE, BLOOD  CBC  POTASSIUM    EKG EKG Interpretation  Date/Time:  Friday July 29 2019 19:23:02 EDT Ventricular Rate:  87 PR Interval:    QRS Duration: 87 QT Interval:  364 QTC Calculation: 438 R Axis:   21 Text Interpretation: Sinus rhythm Minimal ST depression, lateral leads No significant change since last tracing Confirmed by Richardean Canal [id number]) on 07/29/2019 7:26:24 PM   Radiology CT ABDOMEN PELVIS W CONTRAST  Result Date: 07/29/2019 CLINICAL DATA:  Abdominal distension, recently diagnosed with cystitis, not started on prescribed antibiotics EXAM: CT ABDOMEN AND PELVIS WITH CONTRAST TECHNIQUE: Multidetector CT imaging of the abdomen and pelvis was performed using the standard protocol following bolus administration of intravenous contrast. CONTRAST:  OMNIPAQUE IOHEXOL 300 MG/ML  SOLN COMPARISON:  CT 11/04/2016 FINDINGS: Lower chest: Some bandlike atelectatic changes are present in the lung bases. No focal consolidation or effusion. Cardiomegaly with coronary artery atherosclerosis. Aortic leaflet calcifications and descending thoracic aortic atherosclerosis are noted. Small 11 mm soft tissue mass present in the lower outer quadrant of the right breast. Hepatobiliary: No worrisome focal liver abnormality is seen. Normal gallbladder. No visible calcified gallstones. No biliary ductal dilatation. Pancreas: Partial fatty replacement of the pancreas. No pancreatic ductal dilatation or surrounding inflammatory changes. Spleen: Normal in size without focal abnormality. Adrenals/Urinary Tract: Lobular thickening of the left adrenal gland without discernible nodules, similar to prior and likely reflecting some senescent adrenal hyperplasia. Mild bilateral perinephric stranding is symmetric and similar to comparison exam, possibly related to age or diminished renal function. Few  fluid attenuation cysts are present including a partially exophytic 2.9 cm cyst arising from the lower pole left kidney, minimally increased in size from prior without concerning features. Additional subcentimeter hypertension foci too small to fully characterize on CT imaging but statistically likely benign. No urolithiasis  or hydronephrosis. Urinary bladder is decompressed at the time of exam though some mural thickening and perivesicular haze with mucosal hyperemia is present to suggest inflammation. Stomach/Bowel: Distal esophagus, stomach and duodenal sweep are unremarkable. No small bowel wall thickening or dilatation. No evidence of obstruction. The appendix is surgically absent. No colonic dilatation or wall thickening. Scattered colonic diverticula without focal inflammation to suggest diverticulitis. Vascular/Lymphatic: Atherosclerotic calcifications within the abdominal aorta and branch vessels. No aneurysm or ectasia. No enlarged abdominopelvic lymph nodes. Reproductive: Anteverted uterus.  No concerning adnexal lesions. Other: No abdominopelvic free fluid or free gas. No bowel containing hernias. Musculoskeletal: The osseous structures appear diffusely demineralized which may limit detection of small or nondisplaced fractures. Multilevel degenerative changes are present in the imaged portions of the spine. Mild straightening of the thoracolumbar spine is similar to prior. Additional degenerative changes in the hips and pelvis. IMPRESSION: 1. Urinary bladder is decompressed at the time of exam though some mural thickening and perivesicular haze with mucosal hyperemia suggest inflammation/cystitis. Correlate with urinalysis. 2. Small 11 mm soft tissue mass in the lower outer quadrant of the right breast. Enlarged from 2018. Correlate with outpatient mammography/ultrasound as clinically indicated. 3. Colonic diverticulosis without evidence of diverticulitis. 4. Aortic Atherosclerosis (ICD10-I70.0).  Electronically Signed   By: Kreg Shropshire M.D.   On: 07/29/2019 23:42   DG Chest Portable 1 View  Result Date: 07/29/2019 CLINICAL DATA:  Cough with recent diagnosis of cystitis. EXAM: PORTABLE CHEST 1 VIEW COMPARISON:  July 23, 2019 FINDINGS: Mildly decreased lung volumes are seen which is likely secondary to the degree of patient inspiration. Mild, stable atelectasis is seen along the lateral aspect of the mid left lung. There is no evidence of a pleural effusion or pneumothorax. The cardiac silhouette is moderately enlarged. Marked severity calcification of the aortic arch is noted. Degenerative changes seen involving the right shoulder and thoracic spine. IMPRESSION: 1. Mild, stable mid left lung atelectasis. 2. Stable cardiomegaly. 3. Aortic atherosclerosis. Electronically Signed   By: Aram Candela M.D.   On: 07/29/2019 19:25    Procedures Procedures (including critical care time)  Medications Ordered in ED Medications  sodium chloride flush (NS) 0.9 % injection 3 mL (has no administration in time range)  cefTRIAXone (ROCEPHIN) 1 g in sodium chloride 0.9 % 100 mL IVPB (has no administration in time range)  sodium chloride 0.9 % bolus 500 mL (500 mLs Intravenous New Bag/Given (Non-Interop) 07/29/19 2258)  ondansetron (ZOFRAN) injection 4 mg (4 mg Intravenous Given 07/29/19 2013)  LORazepam (ATIVAN) injection 0.5 mg (0.5 mg Intravenous Given 07/29/19 2256)  iohexol (OMNIPAQUE) 300 MG/ML solution 100 mL (100 mLs Intravenous Contrast Given 07/29/19 2320)    ED Course  I have reviewed the triage vital signs and the nursing notes.  Pertinent labs & imaging results that were available during my care of the patient were reviewed by me and considered in my medical decision making (see chart for details).  86 year old presents for evaluation of lethargy and emesis.  She is afebrile, nonseptic appearing.  Diagnosed with UTI 6 days ago however is only been on antibiotics for 4 days.  Started on cefdinir.   Daughter states patient initially was improving however over the last 24 hours has been fatigued, decreased p.o. intake and had 2 episodes of NBNB emesis earlier today.  Daughter is concerned that UTI is worsening.  Does have history of dementia and states she has started to hallucinate again.  Labs imaging personally reviewed and interpreted: CBC without  leukocytosis Metabolic panel with hyperkalemia to 5.5, hyperglycemia to 135, creatinine 1.06 recheck potassium 4.5 Lipase 28 Urinalysis positive for infection will culture. Plan on Rocephin IV as last UA culture sensitive. DG chest with atelectasis however no overt infectious process, cardiomegaly, pulmonary edema EKG without STEMI  Patient reassessed.  Sleeping soundly.  Unfortunately difficulty with IV placement.  Patient reassessed.  Family continues to be at bedside.  IV access has been obtained and she is getting IV fluids.  I have low suspicion for sepsis, no leukocytosis.  Attending Dr. Silverio LayYao has seen evaluated patient.  Recommend CT abdomen given patient's dementia.  Patient's urinalysis has come back positive for UTI.  Prior culture reviewed which is sensitive to Rocephin.  Will give dose here in the ED.  She is becoming a little bit agitated will be given small dose of Ativan.  Likely from her dementia.  CT abdomen pelvis with thickening of bladder wall consistent with cystitis.  Had breast mass which I discussed with family in room they will follow-up outpatient.  Patient tolerating p.o. intake without difficulty. Discussed imaging as well as UA and culture.  Will start on Bactrim PO outpatient.  Was given Rocephin here in ED.  I discussed inpatient management versus outpatient management.  Family in room prefers outpatient managment, symptomatic management and they will follow-up outpatient with urology. They will return for new or worsening symptoms.  Patient does not meet the SIRS or Sepsis criteria.  On repeat exam patient does  not have a surgical abdomin and there are no peritoneal signs.  No indication of appendicitis, bowel obstruction, bowel perforation, cholecystitis, diverticulitis.   The patient has been appropriately medically screened and/or stabilized in the ED. I have low suspicion for any other emergent medical condition which would require further screening, evaluation or treatment in the ED or require inpatient management.  Patient is hemodynamically stable and in no acute distress.  Patient able to ambulate in department prior to ED.  Evaluation does not show acute pathology that would require ongoing or additional emergent interventions while in the emergency department or further inpatient treatment.  I have discussed the diagnosis with the patient and answered all questions.  Pain is been managed while in the emergency department and patient has no further complaints prior to discharge.  Patient is comfortable with plan discussed in room and is stable for discharge at this time.  I have discussed strict return precautions for returning to the emergency department.  Patient was encouraged to follow-up with PCP/specialist refer to at discharge.   MDM Rules/Calculators/A&P                           Final Clinical Impression(s) / ED Diagnoses Final diagnoses:  Acute cystitis without hematuria  Non-intractable vomiting with nausea, unspecified vomiting type  Breast mass    Rx / DC Orders ED Discharge Orders         Ordered    sulfamethoxazole-trimethoprim (BACTRIM DS) 800-160 MG tablet  2 times daily     Discontinue  Reprint     07/29/19 2357    ondansetron (ZOFRAN ODT) 4 MG disintegrating tablet  Every 8 hours PRN     Discontinue  Reprint     07/29/19 2357           Piya Mesch A, PA-C 07/30/19 0008    Charlynne PanderYao, David Hsienta, MD 08/01/19 1601

## 2019-07-29 NOTE — Discharge Instructions (Addendum)
Take the medication as prescribed.  Return for new or worsening symptoms. 

## 2019-07-29 NOTE — ED Triage Notes (Signed)
Patient was seen at [facility] on 07/23/19, Patient was diagnosed with cystits. Patient did not get started on antibiotics as prescribed. Patient was started on antibiotics 4 days ago. Patient was brought in by her caretaker. Caretaker was taking the patient to the urologist today, but started vomiting so caretaker brought her to the ED.

## 2019-07-30 MED ORDER — SULFAMETHOXAZOLE-TRIMETHOPRIM 800-160 MG PO TABS
1.0000 | ORAL_TABLET | Freq: Once | ORAL | Status: AC
  Administered 2019-07-30: 1 via ORAL
  Filled 2019-07-30: qty 1

## 2019-08-01 LAB — URINE CULTURE: Culture: >=100000 — AB

## 2019-08-02 ENCOUNTER — Telehealth: Payer: Self-pay

## 2019-08-02 NOTE — Telephone Encounter (Signed)
Post ED Visit - Positive Culture Follow-up  Culture report reviewed by antimicrobial stewardship pharmacist: [facility] []  , Pharm.D. []  Enzo Bi, Pharm.D., BCPS AQ-ID []  , Pharm.D., BCPS []  Celedonio Miyamoto, Pharm.D., BCPS []  [place], Garvin Fila.D., BCPS, AAHIVP []  , Pharm.D., BCPS, AAHIVP []  Georgina Pillion, PharmD, BCPS []  , PharmD, BCPS []  [place], PharmD, BCPS []  [address], PharmD []  , PharmD, BCPS []  Estella Husk, PharmD  Pharmacy Team []  Lysle Pearl, PharmD []  , PharmD []  Phillips Climes, PharmD []  , Rph []  Agapito Games) , PharmD []  Verlan Friends, PharmD []  , PharmD []  Mervyn Gay, PharmD []  , PharmD []  Vinnie Level, PharmD []  [facility], PharmD []  , PharmD [x]  Len Childs, PharmD   Positive urine culture Treated with Bactrim DS, organism sensitive to the same and no further patient follow-up is required at this time.  08/02/2019, 11:50 AM

## 2019-08-31 ENCOUNTER — Other Ambulatory Visit: Payer: Self-pay

## 2019-08-31 ENCOUNTER — Other Ambulatory Visit: Payer: Medicare Other | Admitting: Hospice

## 2019-08-31 DIAGNOSIS — G308 Other Alzheimer's disease: Secondary | ICD-10-CM

## 2019-08-31 DIAGNOSIS — Z515 Encounter for palliative care: Secondary | ICD-10-CM

## 2019-08-31 NOTE — Progress Notes (Signed)
Therapist, nutritional Palliative Care Consult Note Telephone: [phone]  Fax: [phone] PATIENT NAME:Bonnie Mora DOB:03-Jul-1926 LNL:[id number]  PRIMARY CARE PROVIDER:White, Aram Beecham, MD REFERRING PROVIDER:White, Aram Beecham, MD [address] [place], [place] [address]  RESPONSIBLE PARTY:Son Bonnie Mora [phone] Patient lives at home withCandy  [phone] Bonnie Mora: [phone]    RECOMMENDATIONS/PLAN:  Advance Care Planning/Goals of Care:Visit is to build trust and to follow-up on palliative care. Code Status:Patientis a DNR, form at home; uploaded in Epic. Goals of Care: Goals of care include to maximize quality of life and symptom management.  MOSTform discussions today.  Responsible party -Bonnie Mora- selected limited additional intervention, antibiotics as indicated, IV fluids as indicated, no feeding tube.  MOST form uploaded in epic today. Visit also consisted of counseling and education dealing with the complex and emotionally intense issues of symptom management and palliative care in the setting of serious and potentially life-threatening illness.  Caregiver Bonnie Mora reported that patient engages in coloring and feeding the birds in a local park as therapeutic activities.  Validation and ample emotional support provided.  Palliative care team will continue to support patient, patient's family, and medical team.   Follow OQ:[id number] care will continue to follow patient for goals of care clarification and symptom management.  Next visit in 3 months. Symptom management:Patient seen in ED twice in the last month for acute cystitis without hematuria and then by Urologist who treated patient with Cipro for UTI. Bonnie Mora said confusion/hallucinations/restlessness related to the UTI have cleared since completion of the antibiotics.  Patient is calm, focused and content during visit.  She continues on Cephalaxin  250mg  daily for prophylaxis. She also drinks cranberry juice. Appointment with Urologist is scheduled for tomorrow. She had Physical last week with PCP, with no acute findings except chronic low hemoglobin. Patient to start on OTC multivitamin. Patient denies any vaginal itching/urinary symptoms at this time. Her appetite is good and no weight changes,  percaregiver Bonnie Mora.Memory loss  is ongoing related toDementia FAST 6d, incontinent of bowel and bladder;requiring assistance/guidance to dress properly. Still ambulatory with rolling walker.Patientiscompliant with her medications.Patient denies pain/discomfort, FLACC 0.  Patient continues with exercises and bicycle pedals supervised by caregiver Bonnie Mora.  Caregiver with no concerns at this time. Ongoing supportive care encouraged. I spent 1 hour and 20 minutes providing this consultation,time includes time spent with patient/family, chart review and documentation.More than 50% of the time in this consultation was spent on coordinating communication  HISTORY OF PRESENT ILLNESS:Bonnie J Coxis a 86 y.o.year oldfemalewith multiple medical problems including Alzheimer's dementia, recurrent UTIs, HLD, HTN, anxiety. Palliative Care was asked to help address goals of care CODE STATUS:DNR  PPS:50% HOSPICE ELIGIBILITY/DIAGNOSIS: TBD  PAST MEDICAL HISTORY:  Past Medical History:  Diagnosis Date  . Dementia (HCC)   . Hyperlipidemia   . Hypertension   . Parkinson disease (HCC)     SOCIAL HX:  Social History   Tobacco Use  . Smoking status: Never Smoker  . Smokeless tobacco: Never Used  Substance Use Topics  . Alcohol use: No    Alcohol/week: 0.0 standard drinks    ALLERGIES:  Allergies  Allergen Reactions  . Codeine Nausea And Vomiting     PERTINENT MEDICATIONS:  Outpatient Encounter Medications 84 as of 08/31/2019  Medication Sig  . ALPRAZolam (XANAX) 0.5 MG tablet Take 1 tablet (0.5 mg total) by mouth 2 (two) times daily  as needed for anxiety.  10/31/2019 aspirin 325 MG tablet Take 1 tablet (325 mg  total) by mouth daily.  Marland Kitchen atorvastatin (LIPITOR) 20 MG tablet TAKE 1 TABLET AT 6PM FOR CHOLESTEROL. (Patient taking differently: Take 20 mg by mouth at bedtime. )  . citalopram (CELEXA) 20 MG tablet Take 1 tablet (20 mg total) by mouth daily.  Marland Kitchen MELATONIN PO Take 1 tablet by mouth at bedtime as needed (sleep). OTC product, dosage unknown  . metoprolol succinate (TOPROL-XL) 50 MG 24 hr tablet Take 1 tablet (50 mg total) by mouth daily with breakfast.  . nystatin cream (MYCOSTATIN) Apply to affected area 2 times daily (Patient taking differently: 1 application 2 (two) times daily as needed for dry skin. )  . omeprazole (PRILOSEC) 20 MG capsule Take 20 mg by mouth daily.  . ondansetron (ZOFRAN ODT) 4 MG disintegrating tablet Take 1 tablet (4 mg total) by mouth every 8 (eight) hours as needed for nausea or vomiting.  . polyethylene glycol (MIRALAX / GLYCOLAX) packet Take 17 g by mouth daily as needed for mild constipation.  . traZODone (DESYREL) 100 MG tablet Take 1 tablet (100 mg total) by mouth at bedtime. (Patient taking differently: Take 200 mg by mouth at bedtime. )   No facility-administered encounter medications on file 84 as of 08/31/2019.    PHYSICAL EXAM/ROS: General: NAD, cooperative Cardiovascular: regular rate and rhythm; denies chest pain Pulmonary: clear ant fields; no adventitious sounds auscultated, no shortness of breath Abdomen: soft, nontender, + bowel sounds GU: no suprapubic tenderness Extremities: no edema, no joint deformities Skin: no rashes to exposed skin Neurological: Weakness but otherwise nonfocal; forgetful Bonnie Carpenter, NP

## 2019-10-10 ENCOUNTER — Other Ambulatory Visit: Payer: Self-pay

## 2019-10-10 ENCOUNTER — Emergency Department (HOSPITAL_COMMUNITY)
Admission: EM | Admit: 2019-10-10 | Discharge: 2019-10-11 | Disposition: A | Discharge status: Home / Self Care | Payer: Medicare Other | Attending: Emergency Medicine | Admitting: Emergency Medicine

## 2019-10-10 DIAGNOSIS — F0390 Unspecified dementia without behavioral disturbance: Secondary | ICD-10-CM | POA: Diagnosis not present

## 2019-10-10 DIAGNOSIS — R404 Transient alteration of awareness: Secondary | ICD-10-CM

## 2019-10-10 DIAGNOSIS — G4710 Hypersomnia, unspecified: Secondary | ICD-10-CM | POA: Insufficient documentation

## 2019-10-10 DIAGNOSIS — Z7982 Long term (current) use of aspirin: Secondary | ICD-10-CM | POA: Diagnosis not present

## 2019-10-10 DIAGNOSIS — R4182 Altered mental status, unspecified: Secondary | ICD-10-CM | POA: Diagnosis present

## 2019-10-10 DIAGNOSIS — I10 Essential (primary) hypertension: Secondary | ICD-10-CM | POA: Insufficient documentation

## 2019-10-10 DIAGNOSIS — Z20822 Contact with and (suspected) exposure to covid-19: Secondary | ICD-10-CM | POA: Diagnosis not present

## 2019-10-10 DIAGNOSIS — Z79899 Other long term (current) drug therapy: Secondary | ICD-10-CM | POA: Insufficient documentation

## 2019-10-10 DIAGNOSIS — Z96653 Presence of artificial knee joint, bilateral: Secondary | ICD-10-CM | POA: Insufficient documentation

## 2019-10-10 NOTE — ED Triage Notes (Signed)
In from home by EMS due to altered mental status, responsive to pain only, usually walking around and confused, didn't take any medications today to patient mental status change, sleeping most of day per care giver

## 2019-10-11 ENCOUNTER — Emergency Department (HOSPITAL_COMMUNITY): Payer: Medicare Other

## 2019-10-11 LAB — CBC WITH DIFFERENTIAL/PLATELET
Abs Immature Granulocytes: 0.09 10*3/uL — ABNORMAL HIGH (ref 0.00–0.07)
Basophils Absolute: 0.1 10*3/uL (ref 0.0–0.1)
Basophils Relative: 1 %
Eosinophils Absolute: 0 10*3/uL (ref 0.0–0.5)
Eosinophils Relative: 1 %
HCT: 35.5 % — ABNORMAL LOW (ref 36.0–46.0)
Hemoglobin: 10.9 g/dL — ABNORMAL LOW (ref 12.0–15.0)
Immature Granulocytes: 1 %
Lymphocytes Relative: 31 %
Lymphs Abs: 2.5 10*3/uL (ref 0.7–4.0)
MCH: 31.8 pg (ref 26.0–34.0)
MCHC: 30.7 g/dL (ref 30.0–36.0)
MCV: 103.5 fL — ABNORMAL HIGH (ref 80.0–100.0)
Monocytes Absolute: 0.9 10*3/uL (ref 0.1–1.0)
Monocytes Relative: 11 %
Neutro Abs: 4.5 10*3/uL (ref 1.7–7.7)
Neutrophils Relative %: 55 %
Platelets: 162 10*3/uL (ref 150–400)
RBC: 3.43 MIL/uL — ABNORMAL LOW (ref 3.87–5.11)
RDW: 15.2 % (ref 11.5–15.5)
WBC: 8.1 10*3/uL (ref 4.0–10.5)
nRBC: 0 % (ref 0.0–0.2)

## 2019-10-11 LAB — URINALYSIS, ROUTINE W REFLEX MICROSCOPIC
Bilirubin Urine: NEGATIVE
Glucose, UA: NEGATIVE mg/dL
Hgb urine dipstick: NEGATIVE
Ketones, ur: 5 mg/dL — AB
Leukocytes,Ua: NEGATIVE
Nitrite: NEGATIVE
Protein, ur: NEGATIVE mg/dL
Specific Gravity, Urine: 1.016 (ref 1.005–1.030)
pH: 5 (ref 5.0–8.0)

## 2019-10-11 LAB — AMMONIA: Ammonia: 32 umol/L (ref 9–35)

## 2019-10-11 LAB — COMPREHENSIVE METABOLIC PANEL
ALT: 14 U/L (ref 0–44)
AST: 22 U/L (ref 15–41)
Albumin: 3.6 g/dL (ref 3.5–5.0)
Alkaline Phosphatase: 60 U/L (ref 38–126)
Anion gap: 13 (ref 5–15)
BUN: 20 mg/dL (ref 8–23)
CO2: 22 mmol/L (ref 22–32)
Calcium: 9.1 mg/dL (ref 8.9–10.3)
Chloride: 102 mmol/L (ref 98–111)
Creatinine, Ser: 0.99 mg/dL (ref 0.44–1.00)
GFR calc Af Amer: 58 mL/min — ABNORMAL LOW (ref >60)
GFR calc non Af Amer: 50 mL/min — ABNORMAL LOW (ref >60)
Glucose, Bld: 99 mg/dL (ref 70–99)
Potassium: 4.1 mmol/L (ref 3.5–5.1)
Sodium: 137 mmol/L (ref 135–145)
Total Bilirubin: 0.7 mg/dL (ref 0.3–1.2)
Total Protein: 7 g/dL (ref 6.5–8.1)

## 2019-10-11 LAB — BLOOD GAS, VENOUS
Acid-base deficit: 3.8 mmol/L — ABNORMAL HIGH (ref 0.0–2.0)
Bicarbonate: 20.6 mmol/L (ref 20.0–28.0)
O2 Saturation: 81.7 %
Patient temperature: 98.6
pCO2, Ven: 37.4 mmHg — ABNORMAL LOW (ref 44.0–60.0)
pH, Ven: 7.361 (ref 7.250–7.430)
pO2, Ven: 55 mmHg — ABNORMAL HIGH (ref 32.0–45.0)

## 2019-10-11 LAB — SARS CORONAVIRUS 2 BY RT PCR (HOSPITAL ORDER, PERFORMED IN ~~LOC~~ HOSPITAL LAB): SARS Coronavirus 2: NEGATIVE

## 2019-10-11 LAB — LACTIC ACID, PLASMA: Lactic Acid, Venous: 1.4 mmol/L (ref 0.5–1.9)

## 2019-10-11 LAB — ETHANOL: Alcohol, Ethyl (B): <10 mg/dL (ref <10)

## 2019-10-11 MED ORDER — SODIUM CHLORIDE 0.9 % IV BOLUS
1000.0000 mL | Freq: Once | INTRAVENOUS | Status: AC
  Administered 2019-10-11: 1000 mL via INTRAVENOUS

## 2019-10-11 NOTE — Discharge Instructions (Signed)
The work-up in the emergency department today did not show an infection in the lungs or the urine there was no significant electrolyte change or other laboratory cause of increased sleepiness.  This could be a medication side effect.  Please follow-up with the family doctor in the office.  These return for worsening symptoms or if you would like her reassessed.

## 2019-10-11 NOTE — ED Provider Notes (Signed)
[facility]-EMERGENCY DEPT Provider Note   CSN: [id number] Arrival date & time: 10/10/19  2347     History Chief Complaint  Patient presents with  . Altered Mental Status    Bonnie Mora is a 86 y.o. female.  86 yo F with a chief complaints of altered mental status.  Going on for about 3 to 4 days now.  States that she has had increased sleepiness and decreased activity.  Not given her medications for at least the past 24 hours.  Patient is demented at baseline.  Level 5 caveat.  The history is provided by the patient and the EMS personnel.  Illness Severity:  Moderate Onset quality:  Gradual Duration:  3 days Timing:  Constant Progression:  Worsening Chronicity:  New      Past Medical History:  Diagnosis Date  . Dementia (HCC)   . Hyperlipidemia   . Hypertension   . Parkinson disease [facility])     Patient Active Problem List   Diagnosis Date Noted  . CAP (community acquired pneumonia) 09/20/2018  . UTI due to extended-spectrum beta lactamase (ESBL) producing Escherichia coli 04/13/2017  . Complicated UTI (urinary tract infection)   . Confusion   . Hx of completed stroke 03/08/2017  . Acute encephalopathy 03/06/2017  . Chronic left shoulder pain 08/19/2016  . Routine general medical examination at a health care facility 04/25/2016  . Dementia (HCC) 03/10/2014  . HLD (hyperlipidemia) 03/10/2014  . Essential hypertension 03/10/2014    Past Surgical History:  Procedure Laterality Date  . APPENDECTOMY    . JOINT REPLACEMENT     bilateral knee  . OVARIAN CYST SURGERY    . TONSILLECTOMY AND ADENOIDECTOMY       OB History   No obstetric history on file.     Family History  Problem Relation Age of Onset  . ALS Mother   . Heart disease Father   . Heart disease Maternal Grandfather   . Cancer Maternal Grandfather        breast    Social History   Tobacco Use  . Smoking status: Never Smoker  . Smokeless tobacco: Never Used  Vaping  Use  . Vaping Use: Never used  Substance Use Topics  . Alcohol use: No    Alcohol/week: 0.0 standard drinks  . Drug use: No    Home Medications Prior to Admission medications   Medication Sig Start Date End Date Taking? Authorizing Provider  ALPRAZolam Prudy Feeler) 0.5 MG tablet Take 1 tablet (0.5 mg total) by mouth 2 (two) times daily as needed for anxiety. 04/17/17  Yes Emokpae, Courage, MD  ASHWAGANDHA PO Take 1 tablet by mouth daily at 6 (six) AM. To help with anxiety   Yes [provider]  aspirin 325 MG tablet Take 1 tablet (325 mg total) by mouth daily. 03/10/17  Yes [name], MD  atorvastatin (LIPITOR) 20 MG tablet TAKE 1 TABLET AT 6PM FOR CHOLESTEROL. Patient taking differently: Take 20 mg by mouth at bedtime.  07/20/17  Yes [name], MD  cephALEXin (KEFLEX) 250 MG capsule Take 250 mg by mouth at bedtime. 09/26/19  Yes [provider]  citalopram (CELEXA) 20 MG tablet Take 1 tablet (20 mg total) by mouth daily. 04/17/17  Yes Emokpae, Courage, MD  D-MANNOSE PO Take 1 tablet by mouth daily. To help prevent UTI   Yes [provider]  MELATONIN PO Take 1 tablet by mouth at bedtime as needed (sleep). OTC product, dosage unknown  Yes [provider]  metoprolol succinate (TOPROL-XL) 50 MG 24 hr tablet Take 1 tablet (50 mg total) by mouth daily with breakfast. 04/17/17  Yes Emokpae, Courage, MD  omeprazole (PRILOSEC) 20 MG capsule Take 20 mg by mouth daily.   Yes [provider]  polyethylene glycol (MIRALAX / GLYCOLAX) packet Take 17 g by mouth daily as needed for mild constipation. 03/10/17  Yes Glade LloydAlekh, Kshitiz, MD  traZODone (DESYREL) 100 MG tablet Take 1 tablet (100 mg total) by mouth at bedtime. Patient taking differently: Take 200 mg by mouth at bedtime.  04/25/16  Yes Myrlene Brokerrawford, Elizabeth A, MD  nystatin cream (MYCOSTATIN) Apply to affected area 2 times daily Patient not taking: Reported on 10/11/2019 10/15/17   Abelardo Dieselice, Erika, MD   ondansetron (ZOFRAN ODT) 4 MG disintegrating tablet Take 1 tablet (4 mg total) by mouth every 8 (eight) hours as needed for nausea or vomiting. 07/29/19   Henderly, Britni A, PA-C    Allergies    Codeine  Review of Systems   Review of Systems  Unable to perform ROS: Mental status change    Physical Exam Updated Vital Signs BP (!) 156/68   Pulse 88   Temp 99.9 F (37.7 C) (Rectal)   Resp 16   Ht 5\' 3"  (1.6 m)   Wt 65 kg   SpO2 93%   BMI 25.38 kg/m   Physical Exam Vitals and nursing note reviewed.  Constitutional:      General: She is not in acute distress.    Appearance: She is well-developed. She is not diaphoretic.  HENT:     Head: Normocephalic and atraumatic.  Eyes:     Pupils: Pupils are equal, round, and reactive to light.  Cardiovascular:     Rate and Rhythm: Normal rate and regular rhythm.     Heart sounds: No murmur heard.  No friction rub. No gallop.   Pulmonary:     Effort: Pulmonary effort is normal.     Breath sounds: No wheezing or rales.  Abdominal:     General: There is no distension.     Palpations: Abdomen is soft.     Tenderness: There is no abdominal tenderness.  Musculoskeletal:        General: No tenderness.     Cervical back: Normal range of motion and neck supple.  Skin:    General: Skin is warm and dry.  Neurological:     Mental Status: She is alert.     Comments: Awakens the pain.  Localizes with the right upper extremity and withdrawals from the left.  Diffuse muscle wasting to the lower extremities.     ED Results / Procedures / Treatments   Labs (all labs ordered are listed, but only abnormal results are displayed) Labs Reviewed  COMPREHENSIVE METABOLIC PANEL - Abnormal; Notable for the following components:      Result Value   GFR calc non Af Amer 50 (*)    GFR calc Af Amer 58 (*)    All other components within normal limits  CBC WITH DIFFERENTIAL/PLATELET - Abnormal; Notable for the following components:   RBC 3.43 (*)     Hemoglobin 10.9 (*)    HCT 35.5 (*)    MCV 103.5 (*)    Abs Immature Granulocytes 0.09 (*)    All other components within normal limits  URINALYSIS, ROUTINE W REFLEX MICROSCOPIC - Abnormal; Notable for the following components:   Ketones, ur 5 (*)    All other components within normal  limits  BLOOD GAS, VENOUS - Abnormal; Notable for the following components:   pCO2, Ven 37.4 (*)    pO2, Ven 55.0 (*)    Acid-base deficit 3.8 (*)    All other components within normal limits  SARS CORONAVIRUS 2 BY RT PCR (HOSPITAL ORDER, PERFORMED IN [facility])  CULTURE, BLOOD (ROUTINE X 2)  CULTURE, BLOOD (ROUTINE X 2)  AMMONIA  ETHANOL  LACTIC ACID, PLASMA  LACTIC ACID, PLASMA    EKG None  Radiology CT HEAD WO CONTRAST  Result Date: 10/11/2019 CLINICAL DATA:  Delirium, altered mental status EXAM: CT HEAD WITHOUT CONTRAST TECHNIQUE: Contiguous axial images were obtained from the base of the skull through the vertex without intravenous contrast. COMPARISON:  12/23/2018 FINDINGS: Brain: Normal anatomic configuration. Parenchymal volume loss is commensurate with the patient's age. Moderate periventricular white matter changes are present likely reflecting the sequela of small vessel ischemia. Focal encephalomalacia is seen along the inferior right frontal lobe, unchanged. No abnormal intra or extra-axial mass lesion or fluid collection. No abnormal mass effect or midline shift. No evidence of acute intracranial hemorrhage or infarct. Ventricular size is normal. Cerebellum unremarkable. Vascular: No asymmetric hyperdense vasculature at the skull base. Skull: Intact Sinuses/Orbits: Paranasal sinuses are clear. Orbits are unremarkable. Other: Mastoid air cells and middle ear cavities are clear. IMPRESSION: No acute intracranial abnormality. Electronically Signed   By: Helyn Numbers MD   On: 10/11/2019 01:53   DG Chest Port 1 View  Result Date: 10/11/2019 CLINICAL DATA:  Altered mental status  EXAM: PORTABLE CHEST 1 VIEW COMPARISON:  07/29/2019 FINDINGS: Lungs volumes are small, but are symmetric, and clear. No pneumothorax or pleural effusion. Cardiac size within normal limits. Pulmonary vascularity is normal. Osseous structures are age-appropriate. No acute bone abnormality. IMPRESSION: No active disease. Electronically Signed   By: Helyn Numbers MD   On: 10/11/2019 00:42    Procedures Procedures (including critical care time)  Medications Ordered in ED Medications  sodium chloride 0.9 % bolus 1,000 mL (1,000 mLs Intravenous New Bag/Given 10/11/19 0130)    ED Course  I have reviewed the triage vital signs and the nursing notes.  Pertinent labs & imaging results that were available during my care of the patient were reviewed by me and considered in my medical decision making (see chart for details).    MDM Rules/Calculators/A&P                          86 yo F with a chief complaints of altered mental status.  Going on for about 3 days now.  Will obtain a laboratory evaluation CT of the head reassess.  CT scan of the head without acute finding.  Chest x-ray viewed by me without focal infiltrate.  UA is negative for infection.  No significant leukocytosis, hemoglobin appears to be at baseline.  No significant electrolyte abnormality.  Not hypercarbic.  Ammonia is normal.  EtOH is negative.  I discussed with the family the results of the lab tests.  Discussed the risks and benefits of hospitalization.  Electing to take the patient home.  PCP follow-up  3:27 AM:  I have discussed the diagnosis/risks/treatment options with the patient and family and believe the pt to be eligible for discharge home to follow-up with PCP. We also discussed returning to the ED immediately if new or worsening sx occur. We discussed the sx which are most concerning (e.g., sudden worsening pain, fever, inability to tolerate by  mouth) that necessitate immediate return. Medications administered to the  patient during their visit and any new prescriptions provided to the patient are listed below.  Medications given during this visit Medications  sodium chloride 0.9 % bolus 1,000 mL (1,000 mLs Intravenous New Bag/Given 10/11/19 0130)     The patient appears reasonably screen and/or stabilized for discharge and I doubt any other medical condition or other [facility] requiring further screening, evaluation, or treatment in the ED at this time prior to discharge.   Final Clinical Impression(s) / ED Diagnoses Final diagnoses:  Increased sleeping    Rx / DC Orders ED Discharge Orders    None       Melene Plan, DO 10/11/19 0327

## 2019-10-16 LAB — CULTURE, BLOOD (ROUTINE X 2): Culture: NO GROWTH

## 2019-11-23 ENCOUNTER — Other Ambulatory Visit: Payer: Self-pay

## 2019-11-23 ENCOUNTER — Other Ambulatory Visit: Payer: Medicare Other | Admitting: Hospice

## 2019-11-23 DIAGNOSIS — N390 Urinary tract infection, site not specified: Secondary | ICD-10-CM

## 2019-11-23 DIAGNOSIS — Z515 Encounter for palliative care: Secondary | ICD-10-CM

## 2019-11-23 NOTE — Progress Notes (Signed)
Therapist, nutritional Palliative Care Consult Note Telephone: [phone]  Fax: [phone]  PATIENT NAME: Bonnie Mora DOB: Dec 13, 1926 MRN: [id number]  PRIMARY CARE PROVIDER:   Laurann Montana, MD Laurann Montana, MD [phone] [address] [place],  [place] [address]  REFERRING PROVIDER: Laurann Montana, MD Laurann Montana, MD [phone] [address] [place],  [place] [address] RESPONSIBLE PARTY:[name] [phone] Patient lives at home withCandy [phone] Candy: [phone]  TELEHEALTH VISIT STATEMENT Due to the COVID-19 crisis, this visit was done via telephone from my office. It was initiated and consented to by this patient and/or family.  RECOMMENDATIONS/PLAN:  Advance Care Planning/Goals of Care: Visit consisted of building trust and discussions on Palliative Medicine as specialized medical care for people living with serious illness, aimed at facilitating better quality of life through symptoms relief, assisting with advance care plan and establishing goals of care.    Code Status: Patientis a DNR, form at home; uploaded in Epic.  Goals of Care: Goals of care include to maximize quality of life and symptom management. MOSTselections include limited additional intervention, antibiotics as indicated, IV fluids as indicated, no feeding tube.  MOST form uploaded in epic today. Palliative care team will continue to support patient, patient's family, and medical team.    Follow DJ:SHFWYOVZCH care will continue to follow patient for goals of care clarification and symptom management.   In person visit requested in 3 weeks.  Symptom management:Patient continues with recurrent UTI unabated by prophylactic cephalexin.  Was seen at urgent care last week and started on nitrofurantoin 100 mg twice daily x7 days.  Education provided on need to complete treatment if and when urinary symptoms and altered mental  status-lethargy/confusion-improves. Candy to call with unalleviated symptoms. Memory loss  is ongoing related toDementia FAST 6d, incontinent of bowel and bladder;requiring assistance/guidance to dress properly.  Patient requiring more cueing to eat; difficulty swallowing; Candy reports patient lost 25 pounds in the past 3 months, PPS down to 40% from 50% 2 months ago. Discussed and reviewed disease trajectory of his disease as it is progressive and terminal and would likely lead to worsening dysphagia, weight loss, immobility and eventually death.  Ample emotional support provided.  NP obliged in person visit in 3 weeks to further evaluate.  I spent46 minutes providing this consultation,time includes time spent with patient/family, chart review and documentation.More than 50% of the time in this consultation was spent on coordinating communication  HISTORY OF PRESENT ILLNESS:Bonnie J Coxis a 86 y.o.year oldfemalewith multiple medical problems including Alzheimer's dementia, recurrent UTIs, HLD, HTN, anxiety. Palliative Care was asked to help address goals of care CODE STATUS:DNR  PPS:40%  HOSPICE ELIGIBILITY/DIAGNOSIS: TBD  PAST MEDICAL HISTORY:  Past Medical History:  Diagnosis Date  . Dementia (HCC)   . Hyperlipidemia   . Hypertension   . Parkinson disease (HCC)     SOCIAL HX:  Social History   Tobacco Use  . Smoking status: Never Smoker  . Smokeless tobacco: Never Used  Substance Use Topics  . Alcohol use: No    Alcohol/week: 0.0 standard drinks    ALLERGIES:  Allergies  Allergen Reactions  . Codeine Nausea And Vomiting     PERTINENT MEDICATIONS:  Outpatient Encounter Medications as of 11/23/2019  Medication Sig  . ALPRAZolam (XANAX) 0.5 MG tablet Take 1 tablet (0.5 mg total) by mouth 2 (two) times daily as needed for anxiety.  . ASHWAGANDHA PO Take 1 tablet by mouth daily  at 6 (six) AM. To help with anxiety  . aspirin 325 MG tablet Take 1 tablet (325 mg  total) by mouth daily.  Marland Kitchen atorvastatin (LIPITOR) 20 MG tablet TAKE 1 TABLET AT 6PM FOR CHOLESTEROL. (Patient taking differently: Take 20 mg by mouth at bedtime. )  . cephALEXin (KEFLEX) 250 MG capsule Take 250 mg by mouth at bedtime.  . citalopram (CELEXA) 20 MG tablet Take 1 tablet (20 mg total) by mouth daily.  . D-MANNOSE PO Take 1 tablet by mouth daily. To help prevent UTI  . MELATONIN PO Take 1 tablet by mouth at bedtime as needed (sleep). OTC product, dosage unknown  . metoprolol succinate (TOPROL-XL) 50 MG 24 hr tablet Take 1 tablet (50 mg total) by mouth daily with breakfast.  . nystatin cream (MYCOSTATIN) Apply to affected area 2 times daily (Patient not taking: Reported on 10/11/2019)  . omeprazole (PRILOSEC) 20 MG capsule Take 20 mg by mouth daily.  . ondansetron (ZOFRAN ODT) 4 MG disintegrating tablet Take 1 tablet (4 mg total) by mouth every 8 (eight) hours as needed for nausea or vomiting.  . polyethylene glycol (MIRALAX / GLYCOLAX) packet Take 17 g by mouth daily as needed for mild constipation.  . traZODone (DESYREL) 100 MG tablet Take 1 tablet (100 mg total) by mouth at bedtime. (Patient taking differently: Take 200 mg by mouth at bedtime. )   No facility-administered encounter medications on file as of 11/23/2019.    [name], NP

## 2019-12-14 ENCOUNTER — Telehealth: Payer: Self-pay | Admitting: Hospice

## 2019-12-14 NOTE — Telephone Encounter (Signed)
NP called Kandi for a visit; left her a voicemail with call back number

## 2020-02-28 DIAGNOSIS — N3020 Other chronic cystitis without hematuria: Secondary | ICD-10-CM | POA: Diagnosis not present

## 2020-03-13 DIAGNOSIS — N3000 Acute cystitis without hematuria: Secondary | ICD-10-CM | POA: Diagnosis not present

## 2020-03-13 DIAGNOSIS — N3031 Trigonitis with hematuria: Secondary | ICD-10-CM | POA: Diagnosis not present

## 2020-03-30 DIAGNOSIS — N3020 Other chronic cystitis without hematuria: Secondary | ICD-10-CM | POA: Diagnosis not present

## 2020-03-30 DIAGNOSIS — R350 Frequency of micturition: Secondary | ICD-10-CM | POA: Diagnosis not present

## 2020-05-23 ENCOUNTER — Other Ambulatory Visit: Payer: Medicare Other | Admitting: Hospice

## 2020-05-23 ENCOUNTER — Other Ambulatory Visit: Payer: Self-pay

## 2020-05-23 DIAGNOSIS — N390 Urinary tract infection, site not specified: Secondary | ICD-10-CM

## 2020-05-23 DIAGNOSIS — Z515 Encounter for palliative care: Secondary | ICD-10-CM

## 2020-05-23 DIAGNOSIS — F339 Major depressive disorder, recurrent, unspecified: Secondary | ICD-10-CM

## 2020-05-23 NOTE — Progress Notes (Signed)
Therapist, nutritional Palliative Care Consult Note Telephone: [phone]  Fax: [phone]  PATIENT NAME: Bonnie Mora DOB: [birth year] MRN: [id number]  PRIMARY CARE PROVIDER:   Laurann Montana, MD Laurann Montana, MD [phone] [address] [place],  [place] [address]  REFERRING PROVIDER: Laurann Montana, MD Laurann Montana, MD [phone] [address] [place],  [place] [address]  RESPONSIBLE PARTY:  Inis Sizer [phone] Patient lives at home withCandy [phone]: [phone] Contact Information    Name Relation Home Work Mobile   Hurley,(Care [place]) Thornell Sartorius Other   [phone]   Poupard,Greg Shari Heritage   [phone]   Smith,Pam Daughter [phone]        Visit is to build trust and highlight Palliative Medicine as specialized medical care for people living with serious illness, aimed at facilitating better quality of life through symptoms relief, assisting with advance care planning and complex medical decision making. Candy is home with patient during visit. RECOMMENDATIONS/PLAN:   Advance Care Planning/Code Status: Code status reviewed today. Patientis a DNR  Goals of Care:Goals of care include to maximize quality of life and symptom management. MOSTselections include limited additional intervention, antibiotics as indicated, IV fluids as indicated, no feeding tube.  Palliative care team will continue to support patient, patient's family, and medical team.   Visit consisted of counseling and education dealing with the complex and emotionally intense issues of symptom management and palliative care in the setting of serious and potentially life-threatening illness. Palliative care team will continue to support patient, patient's family, and medical team.  I spent 20 minutes providing this consultation. More than 50% of the time in this consultation was spent on coordinating communication.   -------------------------------------------------------------------------------------------------------------------------------------------------- Symptom management/Plan:  Depression: Citalopram 20mg  daily. Encourage choice activities, social interaction. Complicated UTI: Continue prophylactic cephalexin 250mg  daily and women's probiotic. NO UTI since Dec 2021.  Dementia: Memory loss/confusion is ongoing related toDementia FAST 6d, incontinent of bowel and bladder;requiring assistance/guidance to dress properly. Routine CBC BMP. Palliative care will continue to follow for complex medical decision making, advance care planning, and clarification of goals. Return 12 weeks or prn. Encouraged to call provider sooner with any concerns.  CHIEF COMPLAINT: Palliative follow up visit/Depression  HISTORY OF PRESENT ILLNESS:  Bonnie Mora a 87 y.o. female with multiple medical problems including Depression, chronic, recurrent, worsened by winter and improves with warmer weather. Depression makes her withdraw, moody, not wanting to interact with others and this impairs her quality of life. No hospitalization, no fall. History of  Alzheimer's dementia, recurrent UTIs, HLD, HTN, anxiety. History obtained from review of EMR, discussion with primary team, family, caregiver  and/or patient. Records reviewed and summarized above. All 10 point systems reviewed and are negative except as documented in history of present illness above  Review and summarization of Epic records shows history from other than patient.   Palliative Care was asked to follow this patient by consultation request of 99, MD to help address complex decision making in the context of advance care planning and goals of care clarification.   PPS: 50% ROS  General: NAD, appropriately dressed Constitution: Denies fever/chills EYES: denies vision changes ENMT: denies Xerostomia, dysphagia Cardiovascular: denies chest  pain Pulmonary: denies  cough, denies dyspnea  Abdomen: endorses fair appetite, denies constipation or diarrhea GU: denies dysuria MSK:  endorses ROM limitations, no falls reported Skin: denies rashes/bruising Neurological: endorses weakness, denies pain, denies insomnia Psych: Endorses positive mood Heme/lymph/immuno: denies bruises, no  abnormal bleeding   PHYSICAL EXAM  General: In no acute distress, appropriately dressed Cardiovascular: regular rate and rhythm; no edema in BLE Pulmonary: no cough, no increased work of breathing, normal respiratory effort Abdomen: soft, non tender, no guarding, positive bowel sounds in all quadrants GU:  no suprapubic tenderness Eyes: Normal lids, no discharge, sclera anicteric ENMT: Moist mucous membranes Musculoskeletal:  weakness, sarcopenia Skin: no rash to visible skin, warm without cyanosis,  Psych: non-anxious affect Neurological: Weakness but otherwise non focal Heme/lymph/immuno: no bruises, no bleeding  PERTINENT MEDICATIONS:  Outpatient Encounter Medications as of 05/23/2020  Medication Sig  . ALPRAZolam (XANAX) 0.5 MG tablet Take 1 tablet (0.5 mg total) by mouth 2 (two) times daily as needed for anxiety.  . ASHWAGANDHA PO Take 1 tablet by mouth daily at 6 (six) AM. To help with anxiety  . aspirin 325 MG tablet Take 1 tablet (325 mg total) by mouth daily.  Marland Kitchen atorvastatin (LIPITOR) 20 MG tablet TAKE 1 TABLET AT 6PM FOR CHOLESTEROL. (Patient taking differently: Take 20 mg by mouth at bedtime. )  . cephALEXin (KEFLEX) 250 MG capsule Take 250 mg by mouth at bedtime.  . citalopram (CELEXA) 20 MG tablet Take 1 tablet (20 mg total) by mouth daily.  . D-MANNOSE PO Take 1 tablet by mouth daily. To help prevent UTI  . MELATONIN PO Take 1 tablet by mouth at bedtime as needed (sleep). OTC product, dosage unknown  . metoprolol succinate (TOPROL-XL) 50 MG 24 hr tablet Take 1 tablet (50 mg total) by mouth daily with breakfast.  . nystatin cream  (MYCOSTATIN) Apply to affected area 2 times daily (Patient not taking: Reported on 10/11/2019)  . omeprazole (PRILOSEC) 20 MG capsule Take 20 mg by mouth daily.  . ondansetron (ZOFRAN ODT) 4 MG disintegrating tablet Take 1 tablet (4 mg total) by mouth every 8 (eight) hours as needed for nausea or vomiting.  . polyethylene glycol (MIRALAX / GLYCOLAX) packet Take 17 g by mouth daily as needed for mild constipation.  . traZODone (DESYREL) 100 MG tablet Take 1 tablet (100 mg total) by mouth at bedtime. (Patient taking differently: Take 200 mg by mouth at bedtime. )   No facility-administered encounter medications on file as of 05/23/2020.    HOSPICE ELIGIBILITY/DIAGNOSIS: TBD  PAST MEDICAL HISTORY:  Past Medical History:  Diagnosis Date  . Dementia (HCC)   . Hyperlipidemia   . Hypertension   . Parkinson disease (HCC)      SOCIAL HX: @SOCX  Patient lives at  home for ongoing care  FAMILY HX:  Family History  Problem Relation Age of Onset  . ALS Mother   . Heart disease Father   . Heart disease Maternal Grandfather   . Cancer Maternal Grandfather        breast    Review lab tests/diagnostics Results for Balli, HORTENCE CHARTER "DOT" (MRN Anson Crofts) as of 05/23/2020 13:06  Ref. Range 10/11/2019 00:16  Sodium Latest Ref Range: 135 - 145 mmol/L 137  Potassium Latest Ref Range: 3.5 - 5.1 mmol/L 4.1  Chloride Latest Ref Range: 98 - 111 mmol/L 102  CO2 Latest Ref Range: 22 - 32 mmol/L 22  Glucose Latest Ref Range: 70 - 99 mg/dL 99  BUN Latest Ref Range: 8 - 23 mg/dL 20  Creatinine Latest Ref Range: 0.44 - 1.00 mg/dL 10/13/2019  Calcium Latest Ref Range: 8.9 - 10.3 mg/dL 9.1  Anion gap Latest Ref Range: 5 - 15  13  Alkaline Phosphatase Latest Ref Range: 38 -  126 U/L 60  Albumin Latest Ref Range: 3.5 - 5.0 g/dL 3.6  AST Latest Ref Range: 15 - 41 U/L 22  ALT Latest Ref Range: 0 - 44 U/L 14  Total Protein Latest Ref Range: 6.5 - 8.1 g/dL 7.0  Ammonia Latest Ref Range: 9 - 35 umol/L 32  Total Bilirubin  Latest Ref Range: 0.3 - 1.2 mg/dL 0.7  GFR, Est Non African American Latest Ref Range: >60 mL/min 50 (L)  GFR, Est African American Latest Ref Range: >60 mL/min 58 (L)   ALLERGIES:  Allergies  Allergen Reactions  . Codeine Nausea And Vomiting     Thank you for the opportunity to participate in the care of IDALIS HOELTING Please call our office at [phone] if we can be of additional assistance.  Note: Portions of this note were generated with Scientist, clinical (histocompatibility and immunogenetics). Dictation errors may occur despite best attempts at proofreading.  [name], NP

## 2020-06-05 DIAGNOSIS — R1084 Generalized abdominal pain: Secondary | ICD-10-CM | POA: Diagnosis not present

## 2020-06-05 DIAGNOSIS — N3091 Cystitis, unspecified with hematuria: Secondary | ICD-10-CM | POA: Diagnosis not present

## 2020-06-05 DIAGNOSIS — N3001 Acute cystitis with hematuria: Secondary | ICD-10-CM | POA: Diagnosis not present

## 2020-06-15 DIAGNOSIS — N3000 Acute cystitis without hematuria: Secondary | ICD-10-CM | POA: Diagnosis not present

## 2020-08-13 ENCOUNTER — Other Ambulatory Visit: Payer: Medicare Other | Admitting: Hospice

## 2020-08-13 ENCOUNTER — Other Ambulatory Visit: Payer: Self-pay

## 2020-08-13 DIAGNOSIS — Z515 Encounter for palliative care: Secondary | ICD-10-CM | POA: Diagnosis not present

## 2020-08-13 DIAGNOSIS — F0390 Unspecified dementia without behavioral disturbance: Secondary | ICD-10-CM

## 2020-08-13 DIAGNOSIS — F419 Anxiety disorder, unspecified: Secondary | ICD-10-CM

## 2020-08-13 DIAGNOSIS — F339 Major depressive disorder, recurrent, unspecified: Secondary | ICD-10-CM

## 2020-08-13 NOTE — Progress Notes (Signed)
Therapist, nutritional Palliative Care Consult Note Telephone: [phone]  Fax: [phone]  PATIENT NAME: Bonnie Mora DOB: Apr 12, 1926 MRN: [id number]  PRIMARY CARE PROVIDER:   Laurann Montana, MD Bonnie Montana, MD [phone] [address]nie [address] [place],  [place] [address]  REFERRING PROVIDER: Laurann Montana, MD Bonnie Montana, MD [phone] [address]nie [address] [place],  [place] [address]  RESPONSIBLE PARTY:  Bonnie Mora [phone]  Patient lives at home with [facility]  [phone] Information     Name Relation Home Work Mobile   Bonnie Mora Other   [phone]   Bonnie Mora Bonnie Mora   [phone]   Bonnie Mora Daughter [phone]        TELEHEALTH VISIT STATEMENT Due to the COVID-19 crisis, this visit was done via telemedicine and it was initiated and consent by this patient and or family. Video-audio (telehealth) contact was unable to be done due to technical barriers from the patient's side.   Visit is to build trust and highlight Palliative Medicine as specialized medical care for people living with serious illness, aimed at facilitating better quality of life through symptoms relief, assisting with advance care planning and complex medical decision making. This is a follow up visit.  RECOMMENDATIONS/PLAN:   Advance Care Planning/Code Status:Patient is a Do Not Resuscitate  Goals of Care: Goals of care include to maximize quality of life and symptom management.  Visit consisted of counseling and education dealing with the complex and emotionally intense issues of symptom management and palliative care in the setting of serious and potentially life-threatening illness. Palliative care team will continue to support patient, patient's family, and medical team.  Symptom management/Plan:  Anxiety: Bonnie Mora reports Full dose of 0.5mg  Aplprazolam 'knocks her out completely and she sleeps all the time.' She  said she once tried half dose and it worked well. Continue 0.25mg  Alprazolam daily as needed for anxiety. Deescalation techniques discussed.   Depression: Patient cries sometimes and says she does not know who she is. Citalopram 20mg  daily. Encourage choice activities, social interaction. Complicated UTI: Continue prophylactic cephalexin 250mg  daily and women's probiotic. No recent UTI Dementia: Worsening Memory loss/confusion is ongoing related to Dementia  FAST 6d, incontinent of bowel and bladder;  requiring assistance/guidance to dress properly. Insomnia: Continue Trazodone and Melatonin as ordered.  Routine visit with PCP 08/27/2020.  Follow up: Palliative care will continue to follow for complex medical decision making, advance care planning, and clarification of goals. Return 6 weeks or prn. Encouraged to call provider sooner with any concerns.  CHIEF COMPLAINT: Palliative follow up  HISTORY OF PRESENT ILLNESS:  Bonnie Mora a 86 y.o. female with multiple medical problems including Alzheimer's dementia, recurrent UTIs, HLD, HTN, anxiety. No report of fall or hospitalization since last visit. History obtained from review of EMR, discussion with primary team, family and/or patient. Records reviewed and summarized above. All 10 point systems reviewed and are negative except as documented in history of present illness above  Review and summarization of Epic records shows history from other than patient.   Palliative Care was asked to follow this patient o help address complex decision making in the context of advance care planning and goals of care clarification.   PERTINENT MEDICATIONS:  Outpatient Encounter Medications as of 08/13/2020  Medication Sig   ALPRAZolam (XANAX) 0.5 MG tablet Take 1 tablet (0.5 mg total) by mouth 2 (two) times daily as needed for anxiety.  ASHWAGANDHA PO Take 1 tablet by mouth daily at 6 (six) AM. To help with anxiety   aspirin 325 MG tablet Take 1 tablet (325 mg  total) by mouth daily.   atorvastatin (LIPITOR) 20 MG tablet TAKE 1 TABLET AT 6PM FOR CHOLESTEROL. (Patient taking differently: Take 20 mg by mouth at bedtime. )   cephALEXin (KEFLEX) 250 MG capsule Take 250 mg by mouth at bedtime.   citalopram (CELEXA) 20 MG tablet Take 1 tablet (20 mg total) by mouth daily.   D-MANNOSE PO Take 1 tablet by mouth daily. To help prevent UTI   MELATONIN PO Take 1 tablet by mouth at bedtime as needed (sleep). OTC product, dosage unknown   metoprolol succinate (TOPROL-XL) 50 MG 24 hr tablet Take 1 tablet (50 mg total) by mouth daily with breakfast.   nystatin cream (MYCOSTATIN) Apply to affected area 2 times daily (Patient not taking: Reported on 10/11/2019)   omeprazole (PRILOSEC) 20 MG capsule Take 20 mg by mouth daily.   ondansetron (ZOFRAN ODT) 4 MG disintegrating tablet Take 1 tablet (4 mg total) by mouth every 8 (eight) hours as needed for nausea or vomiting.   polyethylene glycol (MIRALAX / GLYCOLAX) packet Take 17 g by mouth daily as needed for mild constipation.   traZODone (DESYREL) 100 MG tablet Take 1 tablet (100 mg total) by mouth at bedtime. (Patient taking differently: Take 200 mg by mouth at bedtime. )   No facility-administered encounter medications on file as of 08/13/2020.    HOSPICE ELIGIBILITY/DIAGNOSIS: TBD  PAST MEDICAL HISTORY:  Past Medical History:  Diagnosis Date   Dementia (HCC)    Hyperlipidemia    Hypertension    Parkinson disease (HCC)      ALLERGIES:  Allergies  Allergen Reactions   Codeine Nausea And Vomiting      I spent 40 minutes providing this consultation; this includes time spent with patient/family, chart review and documentation. More than 50% of the time in this consultation was spent on counseling and coordinating communication   Thank you for the opportunity to participate in the care of Bonnie Mora Please call our office at [phone] if we can be of additional assistance.  Note: Portions of this note  were generated with Scientist, clinical (histocompatibility and immunogenetics). Dictation errors may occur despite best attempts at proofreading.  [name], NP

## 2020-08-27 DIAGNOSIS — Z23 Encounter for immunization: Secondary | ICD-10-CM | POA: Diagnosis not present

## 2020-08-27 DIAGNOSIS — I10 Essential (primary) hypertension: Secondary | ICD-10-CM | POA: Diagnosis not present

## 2020-08-27 DIAGNOSIS — Z1389 Encounter for screening for other disorder: Secondary | ICD-10-CM | POA: Diagnosis not present

## 2020-08-27 DIAGNOSIS — D649 Anemia, unspecified: Secondary | ICD-10-CM | POA: Diagnosis not present

## 2020-08-27 DIAGNOSIS — Z79899 Other long term (current) drug therapy: Secondary | ICD-10-CM | POA: Diagnosis not present

## 2020-08-27 DIAGNOSIS — Z0000 Encounter for general adult medical examination without abnormal findings: Secondary | ICD-10-CM | POA: Diagnosis not present

## 2020-08-27 DIAGNOSIS — K219 Gastro-esophageal reflux disease without esophagitis: Secondary | ICD-10-CM | POA: Diagnosis not present

## 2020-08-27 DIAGNOSIS — R296 Repeated falls: Secondary | ICD-10-CM | POA: Diagnosis not present

## 2020-08-27 DIAGNOSIS — E785 Hyperlipidemia, unspecified: Secondary | ICD-10-CM | POA: Diagnosis not present

## 2020-08-27 DIAGNOSIS — N63 Unspecified lump in unspecified breast: Secondary | ICD-10-CM | POA: Diagnosis not present

## 2020-08-27 DIAGNOSIS — L821 Other seborrheic keratosis: Secondary | ICD-10-CM | POA: Diagnosis not present

## 2020-09-28 DIAGNOSIS — D649 Anemia, unspecified: Secondary | ICD-10-CM | POA: Diagnosis not present

## 2020-11-10 DIAGNOSIS — R0902 Hypoxemia: Secondary | ICD-10-CM | POA: Diagnosis not present

## 2020-11-10 DIAGNOSIS — Z743 Need for continuous supervision: Secondary | ICD-10-CM | POA: Diagnosis not present

## 2020-11-10 DIAGNOSIS — N3000 Acute cystitis without hematuria: Secondary | ICD-10-CM | POA: Diagnosis not present

## 2020-11-10 DIAGNOSIS — R609 Edema, unspecified: Secondary | ICD-10-CM | POA: Diagnosis not present

## 2020-11-10 DIAGNOSIS — R509 Fever, unspecified: Secondary | ICD-10-CM | POA: Diagnosis not present

## 2020-11-10 DIAGNOSIS — J929 Pleural plaque without asbestos: Secondary | ICD-10-CM | POA: Diagnosis not present

## 2020-11-10 DIAGNOSIS — I639 Cerebral infarction, unspecified: Secondary | ICD-10-CM | POA: Diagnosis not present

## 2020-11-10 DIAGNOSIS — N6310 Unspecified lump in the right breast, unspecified quadrant: Secondary | ICD-10-CM | POA: Diagnosis not present

## 2020-11-10 DIAGNOSIS — N281 Cyst of kidney, acquired: Secondary | ICD-10-CM | POA: Diagnosis not present

## 2020-11-10 DIAGNOSIS — I672 Cerebral atherosclerosis: Secondary | ICD-10-CM | POA: Diagnosis not present

## 2020-11-10 DIAGNOSIS — K5730 Diverticulosis of large intestine without perforation or abscess without bleeding: Secondary | ICD-10-CM | POA: Diagnosis not present

## 2020-11-10 DIAGNOSIS — I10 Essential (primary) hypertension: Secondary | ICD-10-CM | POA: Diagnosis not present

## 2020-11-10 DIAGNOSIS — D61818 Other pancytopenia: Secondary | ICD-10-CM | POA: Diagnosis not present

## 2020-11-11 DIAGNOSIS — Z8673 Personal history of transient ischemic attack (TIA), and cerebral infarction without residual deficits: Secondary | ICD-10-CM | POA: Diagnosis not present

## 2020-11-11 DIAGNOSIS — D61818 Other pancytopenia: Secondary | ICD-10-CM | POA: Diagnosis not present

## 2020-11-11 DIAGNOSIS — H109 Unspecified conjunctivitis: Secondary | ICD-10-CM | POA: Diagnosis not present

## 2020-11-11 DIAGNOSIS — Z743 Need for continuous supervision: Secondary | ICD-10-CM | POA: Diagnosis not present

## 2020-11-11 DIAGNOSIS — N179 Acute kidney failure, unspecified: Secondary | ICD-10-CM | POA: Diagnosis not present

## 2020-11-11 DIAGNOSIS — Z79899 Other long term (current) drug therapy: Secondary | ICD-10-CM | POA: Diagnosis not present

## 2020-11-11 DIAGNOSIS — J690 Pneumonitis due to inhalation of food and vomit: Secondary | ICD-10-CM | POA: Diagnosis not present

## 2020-11-11 DIAGNOSIS — K219 Gastro-esophageal reflux disease without esophagitis: Secondary | ICD-10-CM | POA: Diagnosis not present

## 2020-11-11 DIAGNOSIS — N281 Cyst of kidney, acquired: Secondary | ICD-10-CM | POA: Diagnosis not present

## 2020-11-11 DIAGNOSIS — Z20822 Contact with and (suspected) exposure to covid-19: Secondary | ICD-10-CM | POA: Diagnosis not present

## 2020-11-11 DIAGNOSIS — E785 Hyperlipidemia, unspecified: Secondary | ICD-10-CM | POA: Diagnosis not present

## 2020-11-11 DIAGNOSIS — M1990 Unspecified osteoarthritis, unspecified site: Secondary | ICD-10-CM | POA: Diagnosis not present

## 2020-11-11 DIAGNOSIS — R9389 Abnormal findings on diagnostic imaging of other specified body structures: Secondary | ICD-10-CM | POA: Diagnosis not present

## 2020-11-11 DIAGNOSIS — K5730 Diverticulosis of large intestine without perforation or abscess without bleeding: Secondary | ICD-10-CM | POA: Diagnosis not present

## 2020-11-11 DIAGNOSIS — Z7982 Long term (current) use of aspirin: Secondary | ICD-10-CM | POA: Diagnosis not present

## 2020-11-11 DIAGNOSIS — I10 Essential (primary) hypertension: Secondary | ICD-10-CM | POA: Diagnosis not present

## 2020-11-11 DIAGNOSIS — R509 Fever, unspecified: Secondary | ICD-10-CM | POA: Diagnosis not present

## 2020-11-11 DIAGNOSIS — J929 Pleural plaque without asbestos: Secondary | ICD-10-CM | POA: Diagnosis not present

## 2020-11-11 DIAGNOSIS — Z7401 Bed confinement status: Secondary | ICD-10-CM | POA: Diagnosis not present

## 2020-11-11 DIAGNOSIS — B965 Pseudomonas (aeruginosa) (mallei) (pseudomallei) as the cause of diseases classified elsewhere: Secondary | ICD-10-CM | POA: Diagnosis not present

## 2020-11-11 DIAGNOSIS — Z885 Allergy status to narcotic agent status: Secondary | ICD-10-CM | POA: Diagnosis not present

## 2020-11-11 DIAGNOSIS — N6310 Unspecified lump in the right breast, unspecified quadrant: Secondary | ICD-10-CM | POA: Diagnosis not present

## 2020-11-11 DIAGNOSIS — I672 Cerebral atherosclerosis: Secondary | ICD-10-CM | POA: Diagnosis not present

## 2020-11-11 DIAGNOSIS — R0902 Hypoxemia: Secondary | ICD-10-CM | POA: Diagnosis not present

## 2020-11-11 DIAGNOSIS — I639 Cerebral infarction, unspecified: Secondary | ICD-10-CM | POA: Diagnosis not present

## 2020-11-11 DIAGNOSIS — N3000 Acute cystitis without hematuria: Secondary | ICD-10-CM | POA: Diagnosis not present

## 2020-11-11 DIAGNOSIS — J9601 Acute respiratory failure with hypoxia: Secondary | ICD-10-CM | POA: Diagnosis not present

## 2020-11-11 DIAGNOSIS — E7800 Pure hypercholesterolemia, unspecified: Secondary | ICD-10-CM | POA: Diagnosis not present

## 2020-11-11 DIAGNOSIS — Z87440 Personal history of urinary (tract) infections: Secondary | ICD-10-CM | POA: Diagnosis not present

## 2020-11-11 DIAGNOSIS — N390 Urinary tract infection, site not specified: Secondary | ICD-10-CM | POA: Diagnosis not present

## 2020-11-11 DIAGNOSIS — Z792 Long term (current) use of antibiotics: Secondary | ICD-10-CM | POA: Diagnosis not present

## 2020-11-11 DIAGNOSIS — I4891 Unspecified atrial fibrillation: Secondary | ICD-10-CM | POA: Diagnosis not present

## 2020-11-12 DIAGNOSIS — I4891 Unspecified atrial fibrillation: Secondary | ICD-10-CM | POA: Diagnosis not present

## 2020-11-16 DIAGNOSIS — J9601 Acute respiratory failure with hypoxia: Secondary | ICD-10-CM | POA: Diagnosis not present

## 2020-11-16 DIAGNOSIS — I10 Essential (primary) hypertension: Secondary | ICD-10-CM | POA: Diagnosis not present

## 2020-11-16 DIAGNOSIS — Z8673 Personal history of transient ischemic attack (TIA), and cerebral infarction without residual deficits: Secondary | ICD-10-CM | POA: Diagnosis not present

## 2020-11-16 DIAGNOSIS — Z09 Encounter for follow-up examination after completed treatment for conditions other than malignant neoplasm: Secondary | ICD-10-CM | POA: Diagnosis not present

## 2020-11-16 DIAGNOSIS — N390 Urinary tract infection, site not specified: Secondary | ICD-10-CM | POA: Diagnosis not present

## 2020-11-16 DIAGNOSIS — R9389 Abnormal findings on diagnostic imaging of other specified body structures: Secondary | ICD-10-CM | POA: Diagnosis not present

## 2020-11-16 DIAGNOSIS — F02C4 Dementia in other diseases classified elsewhere, severe, with anxiety: Secondary | ICD-10-CM | POA: Diagnosis not present

## 2020-11-16 DIAGNOSIS — E785 Hyperlipidemia, unspecified: Secondary | ICD-10-CM | POA: Diagnosis not present

## 2020-11-16 DIAGNOSIS — G4700 Insomnia, unspecified: Secondary | ICD-10-CM | POA: Diagnosis not present

## 2020-11-16 DIAGNOSIS — D61818 Other pancytopenia: Secondary | ICD-10-CM | POA: Diagnosis not present

## 2020-11-16 DIAGNOSIS — B965 Pseudomonas (aeruginosa) (mallei) (pseudomallei) as the cause of diseases classified elsewhere: Secondary | ICD-10-CM | POA: Diagnosis not present

## 2020-11-20 DIAGNOSIS — R0902 Hypoxemia: Secondary | ICD-10-CM | POA: Diagnosis not present

## 2020-11-20 DIAGNOSIS — Z743 Need for continuous supervision: Secondary | ICD-10-CM | POA: Diagnosis not present

## 2020-11-20 DIAGNOSIS — R0602 Shortness of breath: Secondary | ICD-10-CM | POA: Diagnosis not present

## 2020-11-20 DIAGNOSIS — Z66 Do not resuscitate: Secondary | ICD-10-CM | POA: Diagnosis not present

## 2020-11-20 DIAGNOSIS — R6889 Other general symptoms and signs: Secondary | ICD-10-CM | POA: Diagnosis not present

## 2020-11-20 DIAGNOSIS — I10 Essential (primary) hypertension: Secondary | ICD-10-CM | POA: Diagnosis not present

## 2020-11-20 DIAGNOSIS — Z885 Allergy status to narcotic agent status: Secondary | ICD-10-CM | POA: Diagnosis not present

## 2020-11-20 DIAGNOSIS — R0600 Dyspnea, unspecified: Secondary | ICD-10-CM | POA: Diagnosis not present

## 2020-11-20 DIAGNOSIS — Z8673 Personal history of transient ischemic attack (TIA), and cerebral infarction without residual deficits: Secondary | ICD-10-CM | POA: Diagnosis not present

## 2020-11-20 DIAGNOSIS — D61818 Other pancytopenia: Secondary | ICD-10-CM | POA: Diagnosis not present

## 2020-11-20 DIAGNOSIS — M1990 Unspecified osteoarthritis, unspecified site: Secondary | ICD-10-CM | POA: Diagnosis not present

## 2020-11-20 DIAGNOSIS — J449 Chronic obstructive pulmonary disease, unspecified: Secondary | ICD-10-CM | POA: Diagnosis not present

## 2020-11-20 DIAGNOSIS — J45909 Unspecified asthma, uncomplicated: Secondary | ICD-10-CM | POA: Diagnosis not present

## 2020-11-20 DIAGNOSIS — K219 Gastro-esophageal reflux disease without esophagitis: Secondary | ICD-10-CM | POA: Diagnosis not present

## 2020-11-20 DIAGNOSIS — Z7982 Long term (current) use of aspirin: Secondary | ICD-10-CM | POA: Diagnosis not present

## 2020-11-20 DIAGNOSIS — R069 Unspecified abnormalities of breathing: Secondary | ICD-10-CM | POA: Diagnosis not present

## 2020-11-20 DIAGNOSIS — F32A Depression, unspecified: Secondary | ICD-10-CM | POA: Diagnosis not present

## 2020-11-20 DIAGNOSIS — E7800 Pure hypercholesterolemia, unspecified: Secondary | ICD-10-CM | POA: Diagnosis not present

## 2020-11-20 DIAGNOSIS — Z79899 Other long term (current) drug therapy: Secondary | ICD-10-CM | POA: Diagnosis not present

## 2020-11-20 DIAGNOSIS — R404 Transient alteration of awareness: Secondary | ICD-10-CM | POA: Diagnosis not present

## 2020-11-23 DIAGNOSIS — Z9981 Dependence on supplemental oxygen: Secondary | ICD-10-CM | POA: Diagnosis not present

## 2020-11-23 DIAGNOSIS — D62 Acute posthemorrhagic anemia: Secondary | ICD-10-CM | POA: Diagnosis not present

## 2020-11-23 DIAGNOSIS — I10 Essential (primary) hypertension: Secondary | ICD-10-CM | POA: Diagnosis not present

## 2020-11-23 DIAGNOSIS — Z87440 Personal history of urinary (tract) infections: Secondary | ICD-10-CM | POA: Diagnosis not present

## 2020-11-23 DIAGNOSIS — G4700 Insomnia, unspecified: Secondary | ICD-10-CM | POA: Diagnosis not present

## 2020-11-23 DIAGNOSIS — K219 Gastro-esophageal reflux disease without esophagitis: Secondary | ICD-10-CM | POA: Diagnosis not present

## 2020-11-23 DIAGNOSIS — L989 Disorder of the skin and subcutaneous tissue, unspecified: Secondary | ICD-10-CM | POA: Diagnosis not present

## 2020-11-23 DIAGNOSIS — J449 Chronic obstructive pulmonary disease, unspecified: Secondary | ICD-10-CM | POA: Diagnosis not present

## 2020-11-23 DIAGNOSIS — E7800 Pure hypercholesterolemia, unspecified: Secondary | ICD-10-CM | POA: Diagnosis not present

## 2020-11-23 DIAGNOSIS — M1990 Unspecified osteoarthritis, unspecified site: Secondary | ICD-10-CM | POA: Diagnosis not present

## 2020-11-23 DIAGNOSIS — F32A Depression, unspecified: Secondary | ICD-10-CM | POA: Diagnosis not present

## 2020-11-23 DIAGNOSIS — Z4800 Encounter for change or removal of nonsurgical wound dressing: Secondary | ICD-10-CM | POA: Diagnosis not present

## 2020-11-23 DIAGNOSIS — F02C4 Dementia in other diseases classified elsewhere, severe, with anxiety: Secondary | ICD-10-CM | POA: Diagnosis not present

## 2020-11-23 DIAGNOSIS — Z8673 Personal history of transient ischemic attack (TIA), and cerebral infarction without residual deficits: Secondary | ICD-10-CM | POA: Diagnosis not present

## 2020-11-23 DIAGNOSIS — J9601 Acute respiratory failure with hypoxia: Secondary | ICD-10-CM | POA: Diagnosis not present

## 2020-11-23 DIAGNOSIS — Z7401 Bed confinement status: Secondary | ICD-10-CM | POA: Diagnosis not present

## 2020-11-23 DIAGNOSIS — I4891 Unspecified atrial fibrillation: Secondary | ICD-10-CM | POA: Diagnosis not present

## 2020-11-23 DIAGNOSIS — Z7982 Long term (current) use of aspirin: Secondary | ICD-10-CM | POA: Diagnosis not present

## 2020-11-23 DIAGNOSIS — D61818 Other pancytopenia: Secondary | ICD-10-CM | POA: Diagnosis not present

## 2020-11-23 DIAGNOSIS — G309 Alzheimer's disease, unspecified: Secondary | ICD-10-CM | POA: Diagnosis not present

## 2020-11-23 DIAGNOSIS — F02C3 Dementia in other diseases classified elsewhere, severe, with mood disturbance: Secondary | ICD-10-CM | POA: Diagnosis not present

## 2020-11-26 DIAGNOSIS — E7800 Pure hypercholesterolemia, unspecified: Secondary | ICD-10-CM | POA: Diagnosis not present

## 2020-11-26 DIAGNOSIS — I4891 Unspecified atrial fibrillation: Secondary | ICD-10-CM | POA: Diagnosis not present

## 2020-11-26 DIAGNOSIS — F02C3 Dementia in other diseases classified elsewhere, severe, with mood disturbance: Secondary | ICD-10-CM | POA: Diagnosis not present

## 2020-11-26 DIAGNOSIS — Z9981 Dependence on supplemental oxygen: Secondary | ICD-10-CM | POA: Diagnosis not present

## 2020-11-26 DIAGNOSIS — D62 Acute posthemorrhagic anemia: Secondary | ICD-10-CM | POA: Diagnosis not present

## 2020-11-26 DIAGNOSIS — Z7982 Long term (current) use of aspirin: Secondary | ICD-10-CM | POA: Diagnosis not present

## 2020-11-26 DIAGNOSIS — F32A Depression, unspecified: Secondary | ICD-10-CM | POA: Diagnosis not present

## 2020-11-26 DIAGNOSIS — Z8673 Personal history of transient ischemic attack (TIA), and cerebral infarction without residual deficits: Secondary | ICD-10-CM | POA: Diagnosis not present

## 2020-11-26 DIAGNOSIS — Z87440 Personal history of urinary (tract) infections: Secondary | ICD-10-CM | POA: Diagnosis not present

## 2020-11-26 DIAGNOSIS — Z7401 Bed confinement status: Secondary | ICD-10-CM | POA: Diagnosis not present

## 2020-11-26 DIAGNOSIS — J9601 Acute respiratory failure with hypoxia: Secondary | ICD-10-CM | POA: Diagnosis not present

## 2020-11-26 DIAGNOSIS — J449 Chronic obstructive pulmonary disease, unspecified: Secondary | ICD-10-CM | POA: Diagnosis not present

## 2020-11-26 DIAGNOSIS — I10 Essential (primary) hypertension: Secondary | ICD-10-CM | POA: Diagnosis not present

## 2020-11-26 DIAGNOSIS — K219 Gastro-esophageal reflux disease without esophagitis: Secondary | ICD-10-CM | POA: Diagnosis not present

## 2020-11-26 DIAGNOSIS — L989 Disorder of the skin and subcutaneous tissue, unspecified: Secondary | ICD-10-CM | POA: Diagnosis not present

## 2020-11-26 DIAGNOSIS — F02C4 Dementia in other diseases classified elsewhere, severe, with anxiety: Secondary | ICD-10-CM | POA: Diagnosis not present

## 2020-11-26 DIAGNOSIS — G4700 Insomnia, unspecified: Secondary | ICD-10-CM | POA: Diagnosis not present

## 2020-11-26 DIAGNOSIS — Z4800 Encounter for change or removal of nonsurgical wound dressing: Secondary | ICD-10-CM | POA: Diagnosis not present

## 2020-11-26 DIAGNOSIS — D61818 Other pancytopenia: Secondary | ICD-10-CM | POA: Diagnosis not present

## 2020-11-26 DIAGNOSIS — M1990 Unspecified osteoarthritis, unspecified site: Secondary | ICD-10-CM | POA: Diagnosis not present

## 2020-11-26 DIAGNOSIS — G309 Alzheimer's disease, unspecified: Secondary | ICD-10-CM | POA: Diagnosis not present

## 2020-11-28 DIAGNOSIS — Z09 Encounter for follow-up examination after completed treatment for conditions other than malignant neoplasm: Secondary | ICD-10-CM | POA: Diagnosis not present

## 2020-11-28 DIAGNOSIS — Z9981 Dependence on supplemental oxygen: Secondary | ICD-10-CM | POA: Diagnosis not present

## 2020-11-28 DIAGNOSIS — R0602 Shortness of breath: Secondary | ICD-10-CM | POA: Diagnosis not present

## 2020-11-29 DIAGNOSIS — Z87440 Personal history of urinary (tract) infections: Secondary | ICD-10-CM | POA: Diagnosis not present

## 2020-11-29 DIAGNOSIS — L989 Disorder of the skin and subcutaneous tissue, unspecified: Secondary | ICD-10-CM | POA: Diagnosis not present

## 2020-11-29 DIAGNOSIS — Z8673 Personal history of transient ischemic attack (TIA), and cerebral infarction without residual deficits: Secondary | ICD-10-CM | POA: Diagnosis not present

## 2020-11-29 DIAGNOSIS — D62 Acute posthemorrhagic anemia: Secondary | ICD-10-CM | POA: Diagnosis not present

## 2020-11-29 DIAGNOSIS — G4700 Insomnia, unspecified: Secondary | ICD-10-CM | POA: Diagnosis not present

## 2020-11-29 DIAGNOSIS — Z4800 Encounter for change or removal of nonsurgical wound dressing: Secondary | ICD-10-CM | POA: Diagnosis not present

## 2020-11-29 DIAGNOSIS — J449 Chronic obstructive pulmonary disease, unspecified: Secondary | ICD-10-CM | POA: Diagnosis not present

## 2020-11-29 DIAGNOSIS — F02C4 Dementia in other diseases classified elsewhere, severe, with anxiety: Secondary | ICD-10-CM | POA: Diagnosis not present

## 2020-11-29 DIAGNOSIS — Z7401 Bed confinement status: Secondary | ICD-10-CM | POA: Diagnosis not present

## 2020-11-29 DIAGNOSIS — E7800 Pure hypercholesterolemia, unspecified: Secondary | ICD-10-CM | POA: Diagnosis not present

## 2020-11-29 DIAGNOSIS — Z9981 Dependence on supplemental oxygen: Secondary | ICD-10-CM | POA: Diagnosis not present

## 2020-11-29 DIAGNOSIS — D61818 Other pancytopenia: Secondary | ICD-10-CM | POA: Diagnosis not present

## 2020-11-29 DIAGNOSIS — F32A Depression, unspecified: Secondary | ICD-10-CM | POA: Diagnosis not present

## 2020-11-29 DIAGNOSIS — I4891 Unspecified atrial fibrillation: Secondary | ICD-10-CM | POA: Diagnosis not present

## 2020-11-29 DIAGNOSIS — M1990 Unspecified osteoarthritis, unspecified site: Secondary | ICD-10-CM | POA: Diagnosis not present

## 2020-11-29 DIAGNOSIS — K219 Gastro-esophageal reflux disease without esophagitis: Secondary | ICD-10-CM | POA: Diagnosis not present

## 2020-11-29 DIAGNOSIS — G309 Alzheimer's disease, unspecified: Secondary | ICD-10-CM | POA: Diagnosis not present

## 2020-11-29 DIAGNOSIS — I10 Essential (primary) hypertension: Secondary | ICD-10-CM | POA: Diagnosis not present

## 2020-11-29 DIAGNOSIS — Z7982 Long term (current) use of aspirin: Secondary | ICD-10-CM | POA: Diagnosis not present

## 2020-11-29 DIAGNOSIS — J9601 Acute respiratory failure with hypoxia: Secondary | ICD-10-CM | POA: Diagnosis not present

## 2020-11-29 DIAGNOSIS — F02C3 Dementia in other diseases classified elsewhere, severe, with mood disturbance: Secondary | ICD-10-CM | POA: Diagnosis not present

## 2020-12-03 DIAGNOSIS — Z87440 Personal history of urinary (tract) infections: Secondary | ICD-10-CM | POA: Diagnosis not present

## 2020-12-03 DIAGNOSIS — G4700 Insomnia, unspecified: Secondary | ICD-10-CM | POA: Diagnosis not present

## 2020-12-03 DIAGNOSIS — M1990 Unspecified osteoarthritis, unspecified site: Secondary | ICD-10-CM | POA: Diagnosis not present

## 2020-12-03 DIAGNOSIS — I10 Essential (primary) hypertension: Secondary | ICD-10-CM | POA: Diagnosis not present

## 2020-12-03 DIAGNOSIS — Z9981 Dependence on supplemental oxygen: Secondary | ICD-10-CM | POA: Diagnosis not present

## 2020-12-03 DIAGNOSIS — J9601 Acute respiratory failure with hypoxia: Secondary | ICD-10-CM | POA: Diagnosis not present

## 2020-12-03 DIAGNOSIS — Z4800 Encounter for change or removal of nonsurgical wound dressing: Secondary | ICD-10-CM | POA: Diagnosis not present

## 2020-12-03 DIAGNOSIS — D62 Acute posthemorrhagic anemia: Secondary | ICD-10-CM | POA: Diagnosis not present

## 2020-12-03 DIAGNOSIS — Z7982 Long term (current) use of aspirin: Secondary | ICD-10-CM | POA: Diagnosis not present

## 2020-12-03 DIAGNOSIS — K219 Gastro-esophageal reflux disease without esophagitis: Secondary | ICD-10-CM | POA: Diagnosis not present

## 2020-12-03 DIAGNOSIS — I4891 Unspecified atrial fibrillation: Secondary | ICD-10-CM | POA: Diagnosis not present

## 2020-12-03 DIAGNOSIS — J449 Chronic obstructive pulmonary disease, unspecified: Secondary | ICD-10-CM | POA: Diagnosis not present

## 2020-12-03 DIAGNOSIS — Z8673 Personal history of transient ischemic attack (TIA), and cerebral infarction without residual deficits: Secondary | ICD-10-CM | POA: Diagnosis not present

## 2020-12-03 DIAGNOSIS — G309 Alzheimer's disease, unspecified: Secondary | ICD-10-CM | POA: Diagnosis not present

## 2020-12-03 DIAGNOSIS — E7800 Pure hypercholesterolemia, unspecified: Secondary | ICD-10-CM | POA: Diagnosis not present

## 2020-12-03 DIAGNOSIS — D61818 Other pancytopenia: Secondary | ICD-10-CM | POA: Diagnosis not present

## 2020-12-03 DIAGNOSIS — F02C3 Dementia in other diseases classified elsewhere, severe, with mood disturbance: Secondary | ICD-10-CM | POA: Diagnosis not present

## 2020-12-03 DIAGNOSIS — F02C4 Dementia in other diseases classified elsewhere, severe, with anxiety: Secondary | ICD-10-CM | POA: Diagnosis not present

## 2020-12-03 DIAGNOSIS — L989 Disorder of the skin and subcutaneous tissue, unspecified: Secondary | ICD-10-CM | POA: Diagnosis not present

## 2020-12-03 DIAGNOSIS — F32A Depression, unspecified: Secondary | ICD-10-CM | POA: Diagnosis not present

## 2020-12-03 DIAGNOSIS — Z7401 Bed confinement status: Secondary | ICD-10-CM | POA: Diagnosis not present

## 2020-12-06 ENCOUNTER — Other Ambulatory Visit: Payer: Self-pay | Admitting: *Deleted

## 2020-12-06 ENCOUNTER — Encounter: Payer: Self-pay | Admitting: *Deleted

## 2020-12-06 NOTE — Patient Outreach (Signed)
[facility]) Care Management  12/06/2020  [name] [birth year] [id number]   [facility]) Care Management Telephonic RN Care Manager Note   12/06/2020 Name:  [name] MRN:  [id number] DOB:  [birth year]  Summary: Completed initial screening  Care coordination needs not identified Care giver agrees to further outreaches for disease management Transition of care reported in referral to be completed by primary care provider  Subjective: Bonnie Mora is an 87 y.o. year old female who is a primary patient of Bonnie Joe, MD. The care management team was consulted for assistance with care management and/or care coordination needs.    Telephonic RN Care Manager completed Telephone Visit today.   Objective:  Medications Reviewed Today     Reviewed by Osa Craver, CPhT (Pharmacy Technician) on 10/11/19 at 0038  Med List Status: Complete   Medication Order Taking? Sig Documenting Provider Last Dose Status Informant  ALPRAZolam (XANAX) 0.5 MG tablet [id number] Yes Take 1 tablet (0.5 mg total) by mouth 2 (two) times daily as needed for anxiety. Shon Hale, MD Past Month Unknown time Active Care [name] PO [id number] Yes Take 1 tablet by mouth daily at 6 (six) AM. To help with anxiety [provider] 10/10/2019 Unknown time Active Care Giver  aspirin 325 MG tablet [id number] Yes Take 1 tablet (325 mg total) by mouth daily. [name], MD 10/10/2019 Unknown time Active Care Giver  atorvastatin (LIPITOR) 20 MG tablet [id number] Yes TAKE 1 TABLET AT 6PM FOR CHOLESTEROL.  Patient taking differently: Take 20 mg by mouth at bedtime.    [name], MD 10/10/2019 am Active Care Giver  cephALEXin (KEFLEX) 250 MG capsule [id number] Yes Take 250 mg by mouth at bedtime. [provider] 10/10/2019 pm  Active Care Giver           Med Note [name], ERICIA A   Tue Oct 11, 2019 12:37 AM) [name] for UTI   citalopram  (CELEXA) 20 MG tablet [id number] Yes Take 1 tablet (20 mg total) by mouth daily. Shon Hale, MD 10/10/2019 hs Active Care Giver  D-MANNOSE PO [id number] Yes Take 1 tablet by mouth daily. To help prevent UTI [provider] 10/10/2019 Unknown time Active Care Giver  MELATONIN PO [id number] Yes Take 1 tablet by mouth at bedtime as needed (sleep). OTC product, dosage unknown [provider] Past Week hs Active Care Giver  metoprolol succinate (TOPROL-XL) 50 MG 24 hr tablet [id number] Yes Take 1 tablet (50 mg total) by mouth daily with breakfast. Shon Hale, MD 10/10/2019 pm Active Care Giver           Med Note (HOOKS, DAVIUS C   Fri Jul 29, 2019 10:05 PM) Pt takes in the evening  nystatin cream (MYCOSTATIN) [id number] No Apply to affected area 2 times daily  Patient not taking: Reported on 10/11/2019   [name], MD Not Taking Unknown time Active Care Giver  omeprazole (PRILOSEC) 20 MG capsule [id number] Yes Take 20 mg by mouth daily. [provider] 10/10/2019 Unknown time Active Care Giver  ondansetron (ZOFRAN ODT) 4 MG disintegrating tablet [id number]  Take 1 tablet (4 mg total) by mouth every 8 (eight) hours as needed for nausea or vomiting. Henderly, Britni A, PA-C  Active Care Giver  polyethylene glycol (MIRALAX / GLYCOLAX) packet [id number] Yes Take 17 g by mouth daily as needed for mild constipation. [name], MD Past Month Unknown time Active Care Giver  traZODone (DESYREL) 100 MG tablet  [id number] Yes Take 1 tablet (100 mg total) by mouth at bedtime.  Patient taking differently: Take 200 mg by mouth at bedtime.    [name], MD 10/10/2019 hs Active Care Giver           Patient Active Problem List   Diagnosis Date Noted   CAP (community acquired pneumonia) 09/20/2018   UTI due to extended-spectrum beta lactamase (ESBL) producing Escherichia coli 04/13/2017   Complicated UTI (urinary tract infection)    Confusion    Hx of completed stroke  03/08/2017   Acute encephalopathy 03/06/2017   Chronic left shoulder pain 08/19/2016   Routine general medical examination at a health care facility 04/25/2016   Dementia (HCC) 03/10/2014   HLD (hyperlipidemia) 03/10/2014   Essential hypertension 03/10/2014     SDOH:  (Social Determinants of Health) assessments and interventions performed:  SDOH Interventions    Flowsheet Row Most Recent Value  SDOH Interventions   Food Insecurity Interventions Intervention Not Indicated  Financial Strain Interventions Intervention Not Indicated  Housing Interventions Intervention Not Indicated  Intimate Partner Violence Interventions Intervention Not Indicated  Physical Activity Interventions Other (Comments)  [dementia]  Stress Interventions Intervention Not Indicated  Social Connections Interventions Other (Comment)  [patient with dementia Stays with her caregiver, with family visits and outings]  Transportation Interventions Intervention Not Indicated       Care Plan  Review of patient past medical history, allergies, medications, health status, including review of consultants reports, laboratory and other test data, was performed as part of comprehensive evaluation for care management services.   Care Plan : RN Care Manager Plan of Care  Updates made by Clinton Gallant, RN since 12/06/2020 12:00 AM     Problem: Complex Care Coordination Needs and disease management in patient with COPD, Dementia   Priority: High     Long-Range Goal: Establish Plan of Care for Management Complex SDOH Barriers, disease management and Care Coordination Needs in patient with COPD Dementia   Start Date: 12/06/2020  Note:   Current Barriers:  Knowledge Deficits related to plan of care for management of COPD and Dementia Care Coordination needs related to Limited education about COPD, Dementia*  RNCM Clinical Goal(s):  Patient will verbalize understanding of plan for management of COPD and Dementia  through collaboration with RN Care manager, provider, and care team.   Interventions: Follow up outreaches as agreed for further assessment for care coordination needs, disease management education Inter-disciplinary care team collaboration (see longitudinal plan of care) Evaluation of current treatment plan related to  self management and patient's adherence to plan as established by provider Sent EMMI education via e-mail on Dementia" caregiver, hypertension, COPD: full program and adult oxygen therapy   COPD Interventions:   Provided patient with basic written and verbal COPD education on self care/management/and exacerbation prevention; Assessed social determinant of health barriers;   Patient Goals/Self-Care Activities: Patient will self administer medications as prescribed Patient will attend all scheduled provider appointments Patient will call provider office for new concerns or questions Use oxygen as ordered  Follow Up Plan:  The patient has been provided with contact information for the care management team and has been advised to call with any health related questions or concerns.  The care management team will reach out to the patient again over the next 60 business days.       Plan: The care management team will reach out to the patient again over the next 60 business days.  Azlyn Wingler L. Noelle Penner,  RN, BSN, CCM [facility] Telephonic Care Management Care Coordinator Office number [phone] [facility] number [phone] Fax number [phone]

## 2020-12-12 DIAGNOSIS — M1990 Unspecified osteoarthritis, unspecified site: Secondary | ICD-10-CM | POA: Diagnosis not present

## 2020-12-12 DIAGNOSIS — F32A Depression, unspecified: Secondary | ICD-10-CM | POA: Diagnosis not present

## 2020-12-12 DIAGNOSIS — J449 Chronic obstructive pulmonary disease, unspecified: Secondary | ICD-10-CM | POA: Diagnosis not present

## 2020-12-12 DIAGNOSIS — G309 Alzheimer's disease, unspecified: Secondary | ICD-10-CM | POA: Diagnosis not present

## 2020-12-12 DIAGNOSIS — Z8673 Personal history of transient ischemic attack (TIA), and cerebral infarction without residual deficits: Secondary | ICD-10-CM | POA: Diagnosis not present

## 2020-12-12 DIAGNOSIS — D62 Acute posthemorrhagic anemia: Secondary | ICD-10-CM | POA: Diagnosis not present

## 2020-12-12 DIAGNOSIS — E7800 Pure hypercholesterolemia, unspecified: Secondary | ICD-10-CM | POA: Diagnosis not present

## 2020-12-12 DIAGNOSIS — F02C3 Dementia in other diseases classified elsewhere, severe, with mood disturbance: Secondary | ICD-10-CM | POA: Diagnosis not present

## 2020-12-12 DIAGNOSIS — L989 Disorder of the skin and subcutaneous tissue, unspecified: Secondary | ICD-10-CM | POA: Diagnosis not present

## 2020-12-12 DIAGNOSIS — K219 Gastro-esophageal reflux disease without esophagitis: Secondary | ICD-10-CM | POA: Diagnosis not present

## 2020-12-12 DIAGNOSIS — F02C4 Dementia in other diseases classified elsewhere, severe, with anxiety: Secondary | ICD-10-CM | POA: Diagnosis not present

## 2020-12-12 DIAGNOSIS — Z4800 Encounter for change or removal of nonsurgical wound dressing: Secondary | ICD-10-CM | POA: Diagnosis not present

## 2020-12-12 DIAGNOSIS — D61818 Other pancytopenia: Secondary | ICD-10-CM | POA: Diagnosis not present

## 2020-12-12 DIAGNOSIS — Z87440 Personal history of urinary (tract) infections: Secondary | ICD-10-CM | POA: Diagnosis not present

## 2020-12-12 DIAGNOSIS — I10 Essential (primary) hypertension: Secondary | ICD-10-CM | POA: Diagnosis not present

## 2020-12-12 DIAGNOSIS — J9601 Acute respiratory failure with hypoxia: Secondary | ICD-10-CM | POA: Diagnosis not present

## 2020-12-12 DIAGNOSIS — Z9981 Dependence on supplemental oxygen: Secondary | ICD-10-CM | POA: Diagnosis not present

## 2020-12-12 DIAGNOSIS — G4700 Insomnia, unspecified: Secondary | ICD-10-CM | POA: Diagnosis not present

## 2020-12-12 DIAGNOSIS — I4891 Unspecified atrial fibrillation: Secondary | ICD-10-CM | POA: Diagnosis not present

## 2020-12-12 DIAGNOSIS — Z7982 Long term (current) use of aspirin: Secondary | ICD-10-CM | POA: Diagnosis not present

## 2020-12-12 DIAGNOSIS — Z7401 Bed confinement status: Secondary | ICD-10-CM | POA: Diagnosis not present

## 2020-12-20 DIAGNOSIS — I10 Essential (primary) hypertension: Secondary | ICD-10-CM | POA: Diagnosis not present

## 2020-12-20 DIAGNOSIS — Z4800 Encounter for change or removal of nonsurgical wound dressing: Secondary | ICD-10-CM | POA: Diagnosis not present

## 2020-12-20 DIAGNOSIS — K219 Gastro-esophageal reflux disease without esophagitis: Secondary | ICD-10-CM | POA: Diagnosis not present

## 2020-12-20 DIAGNOSIS — Z7982 Long term (current) use of aspirin: Secondary | ICD-10-CM | POA: Diagnosis not present

## 2020-12-20 DIAGNOSIS — D62 Acute posthemorrhagic anemia: Secondary | ICD-10-CM | POA: Diagnosis not present

## 2020-12-20 DIAGNOSIS — J449 Chronic obstructive pulmonary disease, unspecified: Secondary | ICD-10-CM | POA: Diagnosis not present

## 2020-12-20 DIAGNOSIS — L989 Disorder of the skin and subcutaneous tissue, unspecified: Secondary | ICD-10-CM | POA: Diagnosis not present

## 2020-12-20 DIAGNOSIS — E7800 Pure hypercholesterolemia, unspecified: Secondary | ICD-10-CM | POA: Diagnosis not present

## 2020-12-20 DIAGNOSIS — Z9981 Dependence on supplemental oxygen: Secondary | ICD-10-CM | POA: Diagnosis not present

## 2020-12-20 DIAGNOSIS — D61818 Other pancytopenia: Secondary | ICD-10-CM | POA: Diagnosis not present

## 2020-12-20 DIAGNOSIS — F02C4 Dementia in other diseases classified elsewhere, severe, with anxiety: Secondary | ICD-10-CM | POA: Diagnosis not present

## 2020-12-20 DIAGNOSIS — F32A Depression, unspecified: Secondary | ICD-10-CM | POA: Diagnosis not present

## 2020-12-20 DIAGNOSIS — F02C3 Dementia in other diseases classified elsewhere, severe, with mood disturbance: Secondary | ICD-10-CM | POA: Diagnosis not present

## 2020-12-20 DIAGNOSIS — M1990 Unspecified osteoarthritis, unspecified site: Secondary | ICD-10-CM | POA: Diagnosis not present

## 2020-12-20 DIAGNOSIS — I4891 Unspecified atrial fibrillation: Secondary | ICD-10-CM | POA: Diagnosis not present

## 2020-12-20 DIAGNOSIS — J9601 Acute respiratory failure with hypoxia: Secondary | ICD-10-CM | POA: Diagnosis not present

## 2020-12-20 DIAGNOSIS — Z87440 Personal history of urinary (tract) infections: Secondary | ICD-10-CM | POA: Diagnosis not present

## 2020-12-20 DIAGNOSIS — Z8673 Personal history of transient ischemic attack (TIA), and cerebral infarction without residual deficits: Secondary | ICD-10-CM | POA: Diagnosis not present

## 2020-12-20 DIAGNOSIS — G309 Alzheimer's disease, unspecified: Secondary | ICD-10-CM | POA: Diagnosis not present

## 2020-12-20 DIAGNOSIS — Z7401 Bed confinement status: Secondary | ICD-10-CM | POA: Diagnosis not present

## 2020-12-20 DIAGNOSIS — G4700 Insomnia, unspecified: Secondary | ICD-10-CM | POA: Diagnosis not present

## 2020-12-21 DIAGNOSIS — I10 Essential (primary) hypertension: Secondary | ICD-10-CM | POA: Diagnosis not present

## 2020-12-21 DIAGNOSIS — Z7401 Bed confinement status: Secondary | ICD-10-CM | POA: Diagnosis not present

## 2020-12-21 DIAGNOSIS — F02C4 Dementia in other diseases classified elsewhere, severe, with anxiety: Secondary | ICD-10-CM | POA: Diagnosis not present

## 2020-12-21 DIAGNOSIS — G309 Alzheimer's disease, unspecified: Secondary | ICD-10-CM | POA: Diagnosis not present

## 2020-12-21 DIAGNOSIS — D62 Acute posthemorrhagic anemia: Secondary | ICD-10-CM | POA: Diagnosis not present

## 2020-12-21 DIAGNOSIS — F02C3 Dementia in other diseases classified elsewhere, severe, with mood disturbance: Secondary | ICD-10-CM | POA: Diagnosis not present

## 2020-12-21 DIAGNOSIS — Z4800 Encounter for change or removal of nonsurgical wound dressing: Secondary | ICD-10-CM | POA: Diagnosis not present

## 2020-12-21 DIAGNOSIS — J449 Chronic obstructive pulmonary disease, unspecified: Secondary | ICD-10-CM | POA: Diagnosis not present

## 2020-12-21 DIAGNOSIS — I4891 Unspecified atrial fibrillation: Secondary | ICD-10-CM | POA: Diagnosis not present

## 2020-12-21 DIAGNOSIS — G4700 Insomnia, unspecified: Secondary | ICD-10-CM | POA: Diagnosis not present

## 2020-12-21 DIAGNOSIS — M1990 Unspecified osteoarthritis, unspecified site: Secondary | ICD-10-CM | POA: Diagnosis not present

## 2020-12-21 DIAGNOSIS — Z87440 Personal history of urinary (tract) infections: Secondary | ICD-10-CM | POA: Diagnosis not present

## 2020-12-21 DIAGNOSIS — Z8673 Personal history of transient ischemic attack (TIA), and cerebral infarction without residual deficits: Secondary | ICD-10-CM | POA: Diagnosis not present

## 2020-12-21 DIAGNOSIS — F32A Depression, unspecified: Secondary | ICD-10-CM | POA: Diagnosis not present

## 2020-12-21 DIAGNOSIS — Z9981 Dependence on supplemental oxygen: Secondary | ICD-10-CM | POA: Diagnosis not present

## 2020-12-21 DIAGNOSIS — D61818 Other pancytopenia: Secondary | ICD-10-CM | POA: Diagnosis not present

## 2020-12-21 DIAGNOSIS — E871 Hypo-osmolality and hyponatremia: Secondary | ICD-10-CM | POA: Diagnosis not present

## 2020-12-21 DIAGNOSIS — K219 Gastro-esophageal reflux disease without esophagitis: Secondary | ICD-10-CM | POA: Diagnosis not present

## 2020-12-21 DIAGNOSIS — E7800 Pure hypercholesterolemia, unspecified: Secondary | ICD-10-CM | POA: Diagnosis not present

## 2020-12-21 DIAGNOSIS — Z7982 Long term (current) use of aspirin: Secondary | ICD-10-CM | POA: Diagnosis not present

## 2020-12-21 DIAGNOSIS — J9601 Acute respiratory failure with hypoxia: Secondary | ICD-10-CM | POA: Diagnosis not present

## 2020-12-21 DIAGNOSIS — L989 Disorder of the skin and subcutaneous tissue, unspecified: Secondary | ICD-10-CM | POA: Diagnosis not present

## 2020-12-22 DIAGNOSIS — D649 Anemia, unspecified: Secondary | ICD-10-CM | POA: Diagnosis not present

## 2020-12-22 DIAGNOSIS — R531 Weakness: Secondary | ICD-10-CM | POA: Diagnosis not present

## 2020-12-22 DIAGNOSIS — Z7401 Bed confinement status: Secondary | ICD-10-CM | POA: Diagnosis not present

## 2020-12-22 DIAGNOSIS — R410 Disorientation, unspecified: Secondary | ICD-10-CM | POA: Diagnosis not present

## 2020-12-22 DIAGNOSIS — I10 Essential (primary) hypertension: Secondary | ICD-10-CM | POA: Diagnosis not present

## 2020-12-22 DIAGNOSIS — Z743 Need for continuous supervision: Secondary | ICD-10-CM | POA: Diagnosis not present

## 2020-12-22 DIAGNOSIS — R5381 Other malaise: Secondary | ICD-10-CM | POA: Diagnosis not present

## 2020-12-22 DIAGNOSIS — J449 Chronic obstructive pulmonary disease, unspecified: Secondary | ICD-10-CM | POA: Diagnosis not present

## 2020-12-22 DIAGNOSIS — Z8673 Personal history of transient ischemic attack (TIA), and cerebral infarction without residual deficits: Secondary | ICD-10-CM | POA: Diagnosis not present

## 2020-12-22 DIAGNOSIS — R279 Unspecified lack of coordination: Secondary | ICD-10-CM | POA: Diagnosis not present

## 2020-12-22 DIAGNOSIS — Z20822 Contact with and (suspected) exposure to covid-19: Secondary | ICD-10-CM | POA: Diagnosis not present

## 2020-12-22 DIAGNOSIS — E785 Hyperlipidemia, unspecified: Secondary | ICD-10-CM | POA: Diagnosis not present

## 2021-01-20 DEATH — deceased

## 2021-01-23 ENCOUNTER — Other Ambulatory Visit: Payer: Self-pay | Admitting: *Deleted

## 2021-01-23 NOTE — Patient Outreach (Signed)
[facility]) Care Management Telephonic RN Care Manager Note Case closure   01/23/2021 Name:  Bonnie Mora MRN:  [id number] DOB:  03-13-1926  Summary: Outreach to patient and caregiver as agreed during last outreach After Bonnie Mora, Caregiver updated RN CM that Bonnie Mora passed on 15-Jan-2021 in hospice care at home.    Patient Active Problem List   Diagnosis Date Noted   CAP (community acquired pneumonia) 09/20/2018   UTI due to extended-spectrum beta lactamase (ESBL) producing Escherichia coli 04/13/2017   Complicated UTI (urinary tract infection)    Confusion    Hx of completed stroke 03/08/2017   Acute encephalopathy 03/06/2017   Chronic left shoulder pain 08/19/2016   Routine general medical examination at a health care facility 04/25/2016   Dementia (HCC) 03/10/2014   HLD (hyperlipidemia) 03/10/2014   Essential hypertension 03/10/2014    Care Plan  Review of patient past medical history, allergies, medications, health status, including review of consultants reports, laboratory and other test data, was performed as part of comprehensive evaluation for care management services.   Care Plan : RN Care Manager Plan of Care  Updates made by Clinton Gallant, RN since 01/23/2021 12:00 AM  Completed 01/23/2021   Problem: Complex Care Coordination Needs and disease management in patient with COPD, Dementia Resolved 01/23/2021  Priority: High     Long-Range Goal: Establish Plan of Care for Management Complex SDOH Barriers, disease management and Care Coordination Needs in patient with COPD Dementia Completed 01/23/2021  Start Date: 12/06/2020  Expected End Date: 01/23/2021  Priority: High  Note:   Current Barriers:  Knowledge Deficits related to plan of care for management of COPD and Dementia Care Coordination needs related to Limited education about COPD, Dementia*  RNCM Clinical Goal(s):  Patient will verbalize understanding of plan for management of  COPD and Dementia through collaboration with RN Care manager, provider, and care team.   Interventions: Follow up outreaches as agreed for further assessment for care coordination needs, disease management education Inter-disciplinary care team collaboration (see longitudinal plan of care) Evaluation of current treatment plan related to  self management and patient's adherence to plan as established by provider Sent EMMI education via e-mail on Dementia" caregiver, hypertension, COPD: full program and adult oxygen therapy   COPD Interventions:   Provided patient with basic written and verbal COPD education on self care/management/and exacerbation prevention; Assessed social determinant of health barriers;   Patient Goals/Self-Care Activities: Patient will self administer medications as prescribed Patient will attend all scheduled provider appointments Patient will call provider office for new concerns or questions Use oxygen as ordered  Follow Up Plan:  01/23/21 case closure patient passed on 01-15-21 at home with hospice care      Plan:  Case closure Patient deceased as of 15-Jan-2021  Bonnie Bradford L. Noelle Penner, RN, BSN, CCM [facility] Telephonic Care Management Care Coordinator Office number [phone] [facility] number [phone] Fax number [phone]
